# Patient Record
Sex: Male | Born: 1950
Health system: Southern US, Community
[De-identification: ages and names within clinical notes are randomized; demographics above are authoritative.]

## PROBLEM LIST (undated history)

## (undated) DIAGNOSIS — I639 Cerebral infarction, unspecified: Secondary | ICD-10-CM

## (undated) DIAGNOSIS — IMO0001 Reserved for inherently not codable concepts without codable children: Secondary | ICD-10-CM

## (undated) HISTORY — DX: Cerebral infarction, unspecified: I63.9

---

## 2002-08-31 ENCOUNTER — Emergency Department (HOSPITAL_COMMUNITY): Admission: EM | Admit: 2002-08-31 | Discharge: 2002-08-31 | Payer: Self-pay | Admitting: Emergency Medicine

## 2010-11-10 ENCOUNTER — Emergency Department (HOSPITAL_COMMUNITY): Admission: EM | Admit: 2010-11-10 | Discharge: 2010-11-10 | Payer: Self-pay | Admitting: Emergency Medicine

## 2016-05-08 ENCOUNTER — Emergency Department (HOSPITAL_BASED_OUTPATIENT_CLINIC_OR_DEPARTMENT_OTHER): Payer: BC Managed Care – PPO

## 2016-05-08 ENCOUNTER — Emergency Department (HOSPITAL_COMMUNITY): Payer: BC Managed Care – PPO

## 2016-05-08 ENCOUNTER — Inpatient Hospital Stay (HOSPITAL_BASED_OUTPATIENT_CLINIC_OR_DEPARTMENT_OTHER)
Admission: EM | Admit: 2016-05-08 | Discharge: 2016-05-15 | DRG: 065 | Disposition: A | Discharge status: Rehabilitation Facility | Payer: BC Managed Care – PPO | Attending: Internal Medicine | Admitting: Internal Medicine

## 2016-05-08 ENCOUNTER — Encounter (HOSPITAL_BASED_OUTPATIENT_CLINIC_OR_DEPARTMENT_OTHER): Payer: Self-pay | Admitting: *Deleted

## 2016-05-08 DIAGNOSIS — E785 Hyperlipidemia, unspecified: Secondary | ICD-10-CM | POA: Diagnosis present

## 2016-05-08 DIAGNOSIS — R1110 Vomiting, unspecified: Secondary | ICD-10-CM | POA: Diagnosis not present

## 2016-05-08 DIAGNOSIS — G459 Transient cerebral ischemic attack, unspecified: Secondary | ICD-10-CM | POA: Diagnosis not present

## 2016-05-08 DIAGNOSIS — I639 Cerebral infarction, unspecified: Secondary | ICD-10-CM | POA: Diagnosis not present

## 2016-05-08 DIAGNOSIS — H538 Other visual disturbances: Secondary | ICD-10-CM | POA: Diagnosis not present

## 2016-05-08 DIAGNOSIS — R29898 Other symptoms and signs involving the musculoskeletal system: Secondary | ICD-10-CM

## 2016-05-08 DIAGNOSIS — I10 Essential (primary) hypertension: Secondary | ICD-10-CM | POA: Diagnosis present

## 2016-05-08 DIAGNOSIS — R42 Dizziness and giddiness: Secondary | ICD-10-CM | POA: Diagnosis not present

## 2016-05-08 DIAGNOSIS — I472 Ventricular tachycardia: Secondary | ICD-10-CM | POA: Diagnosis not present

## 2016-05-08 DIAGNOSIS — G4700 Insomnia, unspecified: Secondary | ICD-10-CM | POA: Diagnosis not present

## 2016-05-08 DIAGNOSIS — R112 Nausea with vomiting, unspecified: Secondary | ICD-10-CM | POA: Diagnosis present

## 2016-05-08 DIAGNOSIS — I63531 Cerebral infarction due to unspecified occlusion or stenosis of right posterior cerebral artery: Secondary | ICD-10-CM | POA: Diagnosis not present

## 2016-05-08 DIAGNOSIS — M6281 Muscle weakness (generalized): Secondary | ICD-10-CM | POA: Diagnosis not present

## 2016-05-08 DIAGNOSIS — M79606 Pain in leg, unspecified: Secondary | ICD-10-CM

## 2016-05-08 HISTORY — DX: Reserved for inherently not codable concepts without codable children: IMO0001

## 2016-05-08 LAB — COMPREHENSIVE METABOLIC PANEL
ALT: 22 U/L (ref 17–63)
AST: 30 U/L (ref 15–41)
Albumin: 4.1 g/dL (ref 3.5–5.0)
Alkaline Phosphatase: 59 U/L (ref 38–126)
Anion gap: 7 (ref 5–15)
BUN: 17 mg/dL (ref 6–20)
CO2: 22 mmol/L (ref 22–32)
Calcium: 8.9 mg/dL (ref 8.9–10.3)
Chloride: 110 mmol/L (ref 101–111)
Creatinine, Ser: 1.01 mg/dL (ref 0.61–1.24)
GFR calc Af Amer: >60 mL/min (ref >60)
GFR calc non Af Amer: >60 mL/min (ref >60)
Glucose, Bld: 139 mg/dL — ABNORMAL HIGH (ref 65–99)
Potassium: 3.5 mmol/L (ref 3.5–5.1)
Sodium: 139 mmol/L (ref 135–145)
Total Bilirubin: 0.5 mg/dL (ref 0.3–1.2)
Total Protein: 7.2 g/dL (ref 6.5–8.1)

## 2016-05-08 LAB — RAPID URINE DRUG SCREEN, HOSP PERFORMED
Amphetamines: NOT DETECTED
Barbiturates: NOT DETECTED
Benzodiazepines: NOT DETECTED
Cocaine: NOT DETECTED
Opiates: NOT DETECTED
Tetrahydrocannabinol: NOT DETECTED

## 2016-05-08 LAB — CBC WITH DIFFERENTIAL/PLATELET
Basophils Absolute: 0 10*3/uL (ref 0.0–0.1)
Basophils Relative: 0 %
Eosinophils Absolute: 0.2 10*3/uL (ref 0.0–0.7)
Eosinophils Relative: 3 %
HCT: 44.8 % (ref 39.0–52.0)
Hemoglobin: 15.7 g/dL (ref 13.0–17.0)
Lymphocytes Relative: 32 %
Lymphs Abs: 2.2 10*3/uL (ref 0.7–4.0)
MCH: 33.3 pg (ref 26.0–34.0)
MCHC: 35 g/dL (ref 30.0–36.0)
MCV: 94.9 fL (ref 78.0–100.0)
Monocytes Absolute: 0.8 10*3/uL (ref 0.1–1.0)
Monocytes Relative: 11 %
Neutro Abs: 3.7 10*3/uL (ref 1.7–7.7)
Neutrophils Relative %: 54 %
Platelets: 212 10*3/uL (ref 150–400)
RBC: 4.72 MIL/uL (ref 4.22–5.81)
RDW: 12.9 % (ref 11.5–15.5)
WBC: 7 10*3/uL (ref 4.0–10.5)

## 2016-05-08 LAB — ETHANOL: Alcohol, Ethyl (B): <5 mg/dL (ref <5)

## 2016-05-08 LAB — URINALYSIS, ROUTINE W REFLEX MICROSCOPIC
Bilirubin Urine: NEGATIVE
Glucose, UA: NEGATIVE mg/dL
Hgb urine dipstick: NEGATIVE
Ketones, ur: NEGATIVE mg/dL
Leukocytes, UA: NEGATIVE
Nitrite: NEGATIVE
Protein, ur: NEGATIVE mg/dL
Specific Gravity, Urine: 1.024 (ref 1.005–1.030)
pH: 6.5 (ref 5.0–8.0)

## 2016-05-08 LAB — CBG MONITORING, ED
Glucose-Capillary: 143 mg/dL — ABNORMAL HIGH (ref 65–99)
Glucose-Capillary: 120 mg/dL — ABNORMAL HIGH (ref 65–99)

## 2016-05-08 LAB — PROTIME-INR
INR: 1.01 (ref 0.00–1.49)
Prothrombin Time: 13.5 seconds (ref 11.6–15.2)

## 2016-05-08 LAB — TROPONIN I: Troponin I: <0.03 ng/mL (ref <0.031)

## 2016-05-08 LAB — APTT: aPTT: 22 seconds — ABNORMAL LOW (ref 24–37)

## 2016-05-08 MED ORDER — DIAZEPAM 5 MG/ML IJ SOLN
1.0000 mg | Freq: Once | INTRAMUSCULAR | Status: AC
  Administered 2016-05-08: 1 mg via INTRAVENOUS
  Filled 2016-05-08: qty 2

## 2016-05-08 MED ORDER — LORAZEPAM 2 MG/ML IJ SOLN
0.5000 mg | Freq: Once | INTRAMUSCULAR | Status: AC
  Administered 2016-05-08: 0.5 mg via INTRAVENOUS
  Filled 2016-05-08: qty 1

## 2016-05-08 MED ORDER — METOCLOPRAMIDE HCL 5 MG/ML IJ SOLN
10.0000 mg | Freq: Once | INTRAMUSCULAR | Status: AC
  Administered 2016-05-08: 10 mg via INTRAVENOUS
  Filled 2016-05-08: qty 2

## 2016-05-08 MED ORDER — PROCHLORPERAZINE EDISYLATE 5 MG/ML IJ SOLN
10.0000 mg | Freq: Once | INTRAMUSCULAR | Status: AC
  Administered 2016-05-08: 10 mg via INTRAVENOUS
  Filled 2016-05-08: qty 2

## 2016-05-08 MED ORDER — DIAZEPAM 5 MG/ML IJ SOLN
5.0000 mg | Freq: Once | INTRAMUSCULAR | Status: AC
  Administered 2016-05-08: 5 mg via INTRAVENOUS
  Filled 2016-05-08: qty 2

## 2016-05-08 MED ORDER — ONDANSETRON HCL 4 MG/2ML IJ SOLN
4.0000 mg | Freq: Once | INTRAMUSCULAR | Status: AC
  Administered 2016-05-08: 4 mg via INTRAVENOUS
  Filled 2016-05-08: qty 2

## 2016-05-08 NOTE — ED Notes (Signed)
Delo MD at bedside. 

## 2016-05-08 NOTE — Consult Note (Signed)
Admission H&P    Chief Complaint: Acute onset of vertigo with nausea and weakness.  HPI: Marinus Eicher is an 65 y.o. male with no previously documented medical history abnormal medications who was brought to the emergency room and code stroke status following acute onset of vertigo and nausea as well as feeling of weakness involving upper extremities primarily. No facial droop was noted. There was also no slurred speech. He has no previous history of stroke nor TIA and has not been on antiplatelet therapy. CT scan of his head showed no acute intracranial abnormality. Clinical exam was unremarkable except for nystagmus on right lateral gaze. MRI of the brain was attempted but could not be obtained because the patient had much worse nausea and abdominal discomfort on lying flat.  LSN: 4:30 PM on 05/08/2016 tPA Given: No: No clear focal deficits mRankin:  History reviewed. No pertinent past medical history.  History reviewed. No pertinent past surgical history.  Family history reviewed. Family history negative for stroke.  Social History:  reports that he has never smoked. He has never used smokeless tobacco. He reports that he does not drink alcohol or use illicit drugs.  Allergies: No Known Allergies  Medications: Patient was on no medications prior to admission.  ROS: History obtained from the patient  General ROS: negative for - chills, fatigue, fever, night sweats, weight gain or weight loss Psychological ROS: negative for - behavioral disorder, hallucinations, memory difficulties, mood swings or suicidal ideation Ophthalmic ROS: negative for - blurry vision, double vision, eye pain or loss of vision ENT ROS: negative for - epistaxis, nasal discharge, oral lesions, sore throat, tinnitus or vertigo Allergy and Immunology ROS: negative for - hives or itchy/watery eyes Hematological and Lymphatic ROS: negative for - bleeding problems, bruising or swollen lymph nodes Endocrine ROS:  negative for - galactorrhea, hair pattern changes, polydipsia/polyuria or temperature intolerance Respiratory ROS: negative for - cough, hemoptysis, shortness of breath or wheezing Cardiovascular ROS: negative for - chest pain, dyspnea on exertion, edema or irregular heartbeat Gastrointestinal ROS: negative for - abdominal pain, diarrhea, hematemesis, nausea/vomiting or stool incontinence Genito-Urinary ROS: negative for - dysuria, hematuria, incontinence or urinary frequency/urgency Musculoskeletal ROS: negative for - joint swelling or muscular weakness Neurological ROS: as noted in HPI Dermatological ROS: negative for rash and skin lesion changes  Physical Examination: Blood pressure 128/88, pulse 75, temperature 97.5 F (36.4 C), temperature source Oral, resp. rate 27, height 6' (1.829 m), weight 90.719 kg (200 lb), SpO2 99 %.  HEENT-  Normocephalic, no lesions, without obvious abnormality.  Normal external eye and conjunctiva.  Normal TM's bilaterally.  Normal auditory canals and external ears. Normal external nose, mucus membranes and septum.  Normal pharynx. Neck supple with no masses, nodes, nodules or enlargement. Cardiovascular - regular rate and rhythm, S1, S2 normal, no murmur, click, rub or gallop Lungs - chest clear, no wheezing, rales, normal symmetric air entry Abdomen - soft, non-tender; bowel sounds normal; no masses,  no organomegaly Extremities - no joint deformities, effusion, or inflammation and no edema  Neurologic Examination: Mental Status: Alert, oriented, complaining of vertigo and nausea.  Speech fluent without evidence of aphasia. Able to follow commands without difficulty. Cranial Nerves: II-Visual fields were normal. III/IV/VI-Pupils were equal and reacted. Extraocular movements were full and conjugate. Mild horizontal nystagmus was noted on right lateral gaze.   V/VII-no facial numbness and no facial weakness. VIII-normal. X-normal speech. XI: trapezius  strength/neck flexion strength normal bilaterally XII-midline tongue extension with normal strength. Motor: 5/5  bilaterally with normal tone and bulk Sensory: Normal throughout. Deep Tendon Reflexes: 1+ and symmetrical. Plantars: Mute bilaterally Cerebellar: Normal finger-to-nose testing. Carotid auscultation: Normal  Results for orders placed or performed during the hospital encounter of 05/08/16 (from the past 48 hour(s))  CBG monitoring, ED     Status: Abnormal   Collection Time: 05/08/16  5:49 PM  Result Value Ref Range   Glucose-Capillary 143 (H) 65 - 99 mg/dL  Ethanol     Status: None   Collection Time: 05/08/16  5:50 PM  Result Value Ref Range   Alcohol, Ethyl (B) <5 <5 mg/dL    Comment:        LOWEST DETECTABLE LIMIT FOR SERUM ALCOHOL IS 5 mg/dL FOR MEDICAL PURPOSES ONLY   Protime-INR     Status: None   Collection Time: 05/08/16  5:50 PM  Result Value Ref Range   Prothrombin Time 13.5 11.6 - 15.2 seconds   INR 1.01 0.00 - 1.49  APTT     Status: Abnormal   Collection Time: 05/08/16  5:50 PM  Result Value Ref Range   aPTT 22 (L) 24 - 37 seconds  Comprehensive metabolic panel     Status: Abnormal   Collection Time: 05/08/16  5:50 PM  Result Value Ref Range   Sodium 139 135 - 145 mmol/L   Potassium 3.5 3.5 - 5.1 mmol/L   Chloride 110 101 - 111 mmol/L   CO2 22 22 - 32 mmol/L   Glucose, Bld 139 (H) 65 - 99 mg/dL   BUN 17 6 - 20 mg/dL   Creatinine, Ser 1.01 0.61 - 1.24 mg/dL   Calcium 8.9 8.9 - 10.3 mg/dL   Total Protein 7.2 6.5 - 8.1 g/dL   Albumin 4.1 3.5 - 5.0 g/dL   AST 30 15 - 41 U/L   ALT 22 17 - 63 U/L   Alkaline Phosphatase 59 38 - 126 U/L   Total Bilirubin 0.5 0.3 - 1.2 mg/dL   GFR calc non Af Amer >60 >60 mL/min   GFR calc Af Amer >60 >60 mL/min    Comment: (NOTE) The eGFR has been calculated using the CKD EPI equation. This calculation has not been validated in all clinical situations. eGFR's persistently <60 mL/min signify possible Chronic  Kidney Disease.    Anion gap 7 5 - 15  CBC with Differential/Platelet     Status: None   Collection Time: 05/08/16  5:50 PM  Result Value Ref Range   WBC 7.0 4.0 - 10.5 K/uL   RBC 4.72 4.22 - 5.81 MIL/uL   Hemoglobin 15.7 13.0 - 17.0 g/dL   HCT 44.8 39.0 - 52.0 %   MCV 94.9 78.0 - 100.0 fL   MCH 33.3 26.0 - 34.0 pg   MCHC 35.0 30.0 - 36.0 g/dL   RDW 12.9 11.5 - 15.5 %   Platelets 212 150 - 400 K/uL   Neutrophils Relative % 54 %   Neutro Abs 3.7 1.7 - 7.7 K/uL   Lymphocytes Relative 32 %   Lymphs Abs 2.2 0.7 - 4.0 K/uL   Monocytes Relative 11 %   Monocytes Absolute 0.8 0.1 - 1.0 K/uL   Eosinophils Relative 3 %   Eosinophils Absolute 0.2 0.0 - 0.7 K/uL   Basophils Relative 0 %   Basophils Absolute 0.0 0.0 - 0.1 K/uL  Troponin I     Status: None   Collection Time: 05/08/16  5:50 PM  Result Value Ref Range   Troponin I <0.03 <0.031 ng/mL  Comment:        NO INDICATION OF MYOCARDIAL INJURY.    Ct Head Wo Contrast  05/08/2016  CLINICAL DATA:  Sudden onset dizziness, vomiting and bilateral upper extremity weakness. EXAM: CT HEAD WITHOUT CONTRAST TECHNIQUE: Contiguous axial images were obtained from the base of the skull through the vertex without intravenous contrast. COMPARISON:  None. FINDINGS: There is some limitation due to patient motion and streak artifacts, despite repeat imaging. Diffusely enlarged ventricles and subarachnoid spaces. No intracranial hemorrhage, mass lesion or CT evidence of acute infarction. Unremarkable bones and included paranasal sinuses. IMPRESSION: Mildly limited examination demonstrating mild atrophy with no acute abnormality. These results were called by telephone at the time of interpretation on 05/08/2016 at 6:09 pm to Alan Mulder, the nurse working with Ian Bushman, PA-C , who verbally acknowledged these results. Electronically Signed   By: Claudie Revering M.D.   On: 05/08/2016 18:11    Assessment: 65 y.o. male with intractable vertigo and nausea of  unclear etiology, but likely benign positional vertigo. However, an acute cerebellar or brainstem stroke cannot be ruled out at this point. Patient has no focal deficits at this point.  Stroke Risk Factors - none  Plan: 1. Meclizine 25 mg every 6 hours 2. MRI of the brain without contrast when patient can tolerate lying flat 3. Stroke workup including risk assessment if MRI shows acute infarction 4. PT consult for vestibular therapy  We will continue to follow this patient with you.  C.R. Nicole Kindred, MD [facility] [phone]  05/08/2016, 7:33 PM

## 2016-05-08 NOTE — ED Notes (Signed)
Family at bedside. 

## 2016-05-08 NOTE — ED Notes (Signed)
Pt report sudden onset dizziness, "like i'm going to pass out"- brought in by EMS. Eval at bedside by EDPA Harris upon arrival to room 6. piv 18 left ac by EMS. 4mg  zofran given pta

## 2016-05-08 NOTE — ED Notes (Signed)
Code stroke called by Arthor CaptainAbigail Harris, EDPA

## 2016-05-08 NOTE — ED Notes (Signed)
Patient transported to CT and returned 

## 2016-05-08 NOTE — H&P (Signed)
History and Physical  Patient Name: [name]     JXB:[id number]    DOB: September 26, 1951    DOA: 05/08/2016 Referring provider: Jacalyn Lefevre, MD PCP: Tony Hillier, MD  Outpatient specialists:  None      Patient coming from: Work via EMS  Chief Complaint: Vertigo  HPI: [name] is a 65 y.o. 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 He stopped the bus, got out and threw up, and then someone called 9-1-1.    In the ED, he was afebrile, hemodynamically stable, and saturating well on room air.  He complained of global weakness, but had no slurred speech, no focal weakness, no facial asymmetry.  He was transferred to [facility] as a CODE STROKE, CT head was normal, and MR brain was attempted but aborted because the patient was repeatedly vomiting.  Neurology were consulted and evaluated the patient, noting lateralizing nystagmus and no other focal deficits.  He then was given several doses of diazepam and lorazepam, and TRH were asked to admit for observation and MRI in the morning.  He denies headache or neck pain.  He has no history of vertigo.      Review of Systems:  All other systems negative except as just noted or noted in the history of present illness.    History reviewed. No pertinent past medical history.  History reviewed. No pertinent past surgical history.  Social History: Patient lives with his wife.  He drives a school bus.  He does not smoke or use alcohol.  He is from [place].  He is ambulatory without assistance and indepdendent with all ADLs and IADLs.    No Known Allergies  Family history: Parents were healthy.  Possibly mother had stroke, wife is not sure, patient unable to confirm.  Prior to Admission medications   None       Physical Exam: BP  132/88 mmHg  Pulse 79  Temp(Src) 97.5 F (36.4 C) (Oral)  Resp 17  Ht 6' (1.829 m)  Wt 90.719 kg (200 lb)  BMI 27.12 kg/m2  SpO2 96% General appearance: Well-developed, adult male, eyes closed, appears uncomfortable, moaning, oriented, but uncomfortable from nausea, just repeats "my head and face are spinning".  Eyes: Anicteric, conjunctiva pink, lids and lashes normal.     ENT: No nasal deformity, discharge, or epistaxis.  OP moist without lesions.   Skin: Warm and dry.  No jaundice.  No suspicious rashes or lesions. Cardiac: RRR, nl S1-S2, no murmurs appreciated.  Capillary refill is brisk.  No LE edema.  Radial and DP pulses 2+ and symmetric. Respiratory: Normal respiratory rate and rhythm.  CTAB without rales or wheezes. Abdomen: Abdomen soft without rigidity.  No TTP. No ascites, distension.   MSK: No deformities or effusions. Neuro: Does not cooperate with exam given nausea and sedation with diazepam/lorazepam, no nystagmus elicited on my exam.  Oriented but very uncomfortable.  No facial asymmetry.  Speech is fluent.  Moves all extremities equally and with normal coordination, but globally 4/5 strength, very weak. Psych: Affect: in discomfort.  No evidence of aural or visual hallucinations or delusions.       Labs on Admission:  I have personally reviewed following labs and imaging studies: CBC:  Recent Labs Lab 05/08/16 1750  WBC 7.0  NEUTROABS  3.7  HGB 15.7  HCT 44.8  MCV 94.9  PLT 212   Basic Metabolic Panel:  Recent Labs Lab 05/08/16 1750  NA 139  K 3.5  CL 110  CO2 22  GLUCOSE 139*  BUN 17  CREATININE 1.01  CALCIUM 8.9   GFR: Estimated Creatinine Clearance: 80 mL/min (by C-G formula based on Cr of 1.01). Liver Function Tests:  Recent Labs Lab 05/08/16 1750  AST 30  ALT 22  ALKPHOS 59  BILITOT 0.5  PROT 7.2  ALBUMIN 4.1   No results for input(s): LIPASE, AMYLASE in the last 168 hours. No results for input(s): AMMONIA in the last 168  hours. Coagulation Profile:  Recent Labs Lab 05/08/16 1750  INR 1.01   Cardiac Enzymes:  Recent Labs Lab 05/08/16 1750  TROPONINI <0.03   BNP (last 3 results) No results for input(s): PROBNP in the last 8760 hours. HbA1C: No results for input(s): HGBA1C in the last 72 hours. CBG:  Recent Labs Lab 05/08/16 1749  GLUCAP 143*   Lipid Profile: No results for input(s): CHOL, HDL, LDLCALC, TRIG, CHOLHDL, LDLDIRECT in the last 72 hours. Thyroid Function Tests: No results for input(s): TSH, T4TOTAL, FREET4, T3FREE, THYROIDAB in the last 72 hours. Anemia Panel: No results for input(s): VITAMINB12, FOLATE, FERRITIN, TIBC, IRON, RETICCTPCT in the last 72 hours. Urine analysis:    Component Value Date/Time   COLORURINE YELLOW 05/08/2016 2040   APPEARANCEUR CLEAR 05/08/2016 2040   LABSPEC 1.024 05/08/2016 2040   PHURINE 6.5 05/08/2016 2040   GLUCOSEU NEGATIVE 05/08/2016 2040   HGBUR NEGATIVE 05/08/2016 2040   BILIRUBINUR NEGATIVE 05/08/2016 2040   KETONESUR NEGATIVE 05/08/2016 2040   PROTEINUR NEGATIVE 05/08/2016 2040   NITRITE NEGATIVE 05/08/2016 2040   LEUKOCYTESUR NEGATIVE 05/08/2016 2040   Sepsis Labs: @LABRCNTIP (procalcitonin:4,lacticidven:4) )No results found for this or any previous visit (from the past 240 hour(s)).       Radiological Exams on Admission: Personally reviewed: Ct Head Wo Contrast  05/08/2016  CLINICAL DATA:  Sudden onset dizziness, vomiting and bilateral upper extremity weakness. EXAM: CT HEAD WITHOUT CONTRAST TECHNIQUE: Contiguous axial images were obtained from the base of the skull through the vertex without intravenous contrast. COMPARISON:  None. FINDINGS: There is some limitation due to patient motion and streak artifacts, despite repeat imaging. Diffusely enlarged ventricles and subarachnoid spaces. No intracranial hemorrhage, mass lesion or CT evidence of acute infarction. Unremarkable bones and included paranasal sinuses. IMPRESSION: Mildly  limited examination demonstrating mild atrophy with no acute abnormality. These results were called by telephone at the time of interpretation on 05/08/2016 at 6:09 pm to Tony PersiaKelly Berg, the nurse working with Tony BouchardAbigal Harris, PA-C , who verbally acknowledged these results. Electronically Signed   By: Beckie SaltsSteven  Reid M.D.   On: 05/08/2016 18:11    EKG: Independently reviewed. Rate 79, QTc 467, wandering basleine, but no clear ST depressions or TW changes.    Assessment/Plan 1. Vertigo with intractable vomiting:  Suspect peripheral vertigo.  Cannot rule out posterior circ stroke at this time.   -MR brain per Neurology for r/o stroke -If stroke present, echocardiogram and CTA will be ordered. -Otherwise, will focus on symptomatic treatment of what is likely BPPV -meclizine 25 mg TID PRN -Lorazepam PRN -Ondansetron PRN -PT for vestibular rehab      DVT prophylaxis: Lovenox  Code Status: FULL  Family Communication: Wife at bedside  Disposition Plan: Anticipate observation overnight and MRI brain tomorrow.  If normal, home with symptomatic care for BPPV, otherwise, stroke work  up per protocol Consults called: Neuro Medical decision making: Patient seen at 10:55 PM on 05/08/2016.  The patient was discussed with Dr. Particia Nearing. I recommend admission to telemetry, observation status.  Clinical condition: stable.      Alberteen Sam [facility] Pager [phone]

## 2016-05-08 NOTE — ED Notes (Signed)
Report attempted, CN to assign pt and RN to call back.

## 2016-05-08 NOTE — ED Notes (Signed)
[facility]

## 2016-05-08 NOTE — ED Notes (Signed)
Neuro at bedside.

## 2016-05-08 NOTE — ED Notes (Signed)
Unable to complete MRI of head due to pt unable to stay flat on the MRI machine, pt became very nauseous and starting vomiting, nausea medication given x 2 with no relief. MD to be notified.

## 2016-05-08 NOTE — ED Provider Notes (Signed)
CSN: [id number]     Arrival date & time 05/08/16  1740 History   First MD Initiated Contact with Patient 05/08/16 1758     Chief Complaint  Patient presents with  . Dizziness     (Consider location/radiation/quality/duration/timing/severity/associated sxs/prior Treatment) HPI Comments: Patient is a 65 year old male with no significant past medical history and who takes no medications. 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 He also developed weakness in both upper arms and blurry vision. He was given Zofran in route for his nausea, but remains very dizzy.  Patient is a 65 y.o. male presenting with dizziness. The history is provided by the patient and the EMS personnel.  Dizziness Quality:  Head spinning and imbalance Severity:  Severe Onset quality:  Sudden Duration:  1 hour Timing:  Constant Progression:  Unchanged Chronicity:  New Relieved by:  Nothing Worsened by:  Movement and sitting upright Ineffective treatments:  None tried Associated symptoms: nausea, vision changes, vomiting and weakness   Associated symptoms: no headaches and no tinnitus     History reviewed. No pertinent past medical history. History reviewed. No pertinent past surgical history. No family history on file. Social History  Substance Use Topics  . Smoking status: Never Smoker   . Smokeless tobacco: Never Used  . Alcohol Use: No    Review of Systems  HENT: Negative for tinnitus.   Gastrointestinal: Positive for nausea and vomiting.  Neurological: Positive for dizziness and weakness. Negative for headaches.  All other systems reviewed and are negative.     Allergies  Review of patient's allergies indicates no known allergies.  Home Medications   Prior to Admission medications   Not on File   BP 149/84 mmHg  Pulse 72  Temp(Src) 97.5 F (36.4 C) (Oral)  Resp 21  Ht 6' (1.829 m)  Wt 200 lb (90.719 kg)  BMI  27.12 kg/m2  SpO2 100% Physical Exam  Constitutional: He is oriented to person, place, and time. He appears well-developed and well-nourished.  Patient is a 65 year old male who appears uncomfortable.  HENT:  Head: Normocephalic and atraumatic.  Mouth/Throat: Oropharynx is clear and moist.  Eyes: Pupils are equal, round, and reactive to light.  He has a hard time tracking objects with extraocular muscles.  Neck: Normal range of motion. Neck supple.  Cardiovascular: Normal rate and regular rhythm.  Exam reveals no friction rub.   No murmur heard. Pulmonary/Chest: Effort normal and breath sounds normal. No respiratory distress. He has no wheezes. He has no rales.  Abdominal: Soft. Bowel sounds are normal. He exhibits no distension. There is no tenderness.  Musculoskeletal: Normal range of motion. He exhibits no edema.  Neurological: He is alert and oriented to person, place, and time. No cranial nerve deficit. He exhibits normal muscle tone. Coordination normal.  Skin: Skin is warm and dry. He is not diaphoretic.  Nursing note and vitals reviewed.   ED Course  Procedures (including critical care time) Labs Review Labs Reviewed  CBG MONITORING, ED - Abnormal; Notable for the following:    Glucose-Capillary 143 (*)    All other components within normal limits  ETHANOL  PROTIME-INR  APTT  DIFFERENTIAL  COMPREHENSIVE METABOLIC PANEL  URINE RAPID DRUG SCREEN, HOSP PERFORMED  URINALYSIS, ROUTINE W REFLEX MICROSCOPIC (NOT AT [facility])  CBC WITH DIFFERENTIAL/PLATELET  I-STAT CHEM 8, ED  I-STAT TROPOININ, ED    Imaging Review No results found. I have personally reviewed  and evaluated these images and lab results as part of my medical decision-making.   EKG Interpretation   Date/Time:  Tuesday May 08 2016 17:42:48 EDT Ventricular Rate:  79 PR Interval:  193 QRS Duration: 107 QT Interval:  407 QTC Calculation: 467 R Axis:   85 Text Interpretation:  Sinus rhythm Consider right  ventricular hypertrophy  Confirmed by Oluwaseun Bruyere  MD, Winferd Wease ([id number]) on 05/08/2016 10:06:03 PM      MDM   Final diagnoses:  Vertigo  Upper extremity weakness  Nausea and vomiting, vomiting of unspecified type    Patient arrives here acutely vertiginous and weakness in the arms on exam. His symptoms are possibly related to peripheral vertigo, however they are also possibly related to an acute posterior circulation stroke. For this reason, a code stroke was called and Dr. Amada JupiterKirkpatrick was consult. We came to the conclusion of patient's best served being worked up [facility] where intervention could potentially be performed should the patient actually be experiencing an acute stroke. The CT head was negative here. [facility].    Geoffery Lyonsouglas Mazell Aylesworth, MD 05/08/16 2209

## 2016-05-08 NOTE — ED Notes (Signed)
Transported to [facility] by [facility] at this time

## 2016-05-08 NOTE — ED Provider Notes (Signed)
CSN: [id number]     Arrival date & time 05/08/16  1740 History   First MD Initiated Contact with Patient 05/08/16 1758     Chief Complaint  Patient presents with  . Dizziness     (Consider location/radiation/quality/duration/timing/severity/associated sxs/prior Treatment) HPI  History reviewed. No pertinent past medical history. History reviewed. No pertinent past surgical history. No family history on file. Social History  Substance Use Topics  . Smoking status: Never Smoker   . Smokeless tobacco: Never Used  . Alcohol Use: No    Review of Systems    Allergies  Review of patient's allergies indicates no known allergies.  Home Medications   Prior to Admission medications   Not on File   BP 128/84 mmHg  Pulse 77  Temp(Src) 97.5 F (36.4 C) (Oral)  Resp 19  Ht 6' (1.829 m)  Wt 200 lb (90.719 kg)  BMI 27.12 kg/m2  SpO2 99% Physical Exam  ED Course  Procedures (including critical care time) Labs Review Labs Reviewed  APTT - Abnormal; Notable for the following:    aPTT 22 (*)    All other components within normal limits  COMPREHENSIVE METABOLIC PANEL - Abnormal; Notable for the following:    Glucose, Bld 139 (*)    All other components within normal limits  CBG MONITORING, ED - Abnormal; Notable for the following:    Glucose-Capillary 143 (*)    All other components within normal limits  ETHANOL  PROTIME-INR  URINE RAPID DRUG SCREEN, HOSP PERFORMED  URINALYSIS, ROUTINE W REFLEX MICROSCOPIC (NOT [facility])  CBC WITH DIFFERENTIAL/PLATELET  TROPONIN I  DIFFERENTIAL    Imaging Review Ct Head Wo Contrast  05/08/2016  CLINICAL DATA:  Sudden onset dizziness, vomiting and bilateral upper extremity weakness. EXAM: CT HEAD WITHOUT CONTRAST TECHNIQUE: Contiguous axial images were obtained from the base of the skull through the vertex without intravenous contrast. COMPARISON:  None. FINDINGS: There is some limitation due to patient motion and streak artifacts, despite  repeat imaging. Diffusely enlarged ventricles and subarachnoid spaces. No intracranial hemorrhage, mass lesion or CT evidence of acute infarction. Unremarkable bones and included paranasal sinuses. IMPRESSION: Mildly limited examination demonstrating mild atrophy with no acute abnormality. These results were called by telephone at the time of interpretation on 05/08/2016 at 6:09 pm to Jose PersiaKelly Conner, the nurse working with Rutha BouchardAbigal Harris, PA-C , who verbally acknowledged these results. Electronically Signed   By: Beckie SaltsSteven  Reid M.D.   On: 05/08/2016 18:11   I have personally reviewed and evaluated these images and lab results as part of my medical decision-making.   EKG Interpretation None      MDM  [facility]  DR. Amada JupiterKIRKPATRICK (NEURO) IN THE ROOM WITH PT AS WELL.  PT DOES NOT HAVE ANY FOCAL DEFICITS, BUT IS VERY DIZZY AND HAS N/V.  PT SENT TO MRI, BUT HE WAS UNABLE TO LAY FLAT FOR THE EXAM.  PT ADDITIONALLY SEEN BY DR. Roseanne RenoSTEWART (NEURO) WHO THINKS PT PROBABLY HAS VERTIGO, BUT A CVA IS NOT RULED OUT DUE TO INABILITY TO DO THE MRI.  HE FEELS PT SHOULD BE OBSERVED OVERNIGHT ADMITTED BY THE HOSPITALIST.  PT D/W DR. Maryfrances BunnellANFORD (HOSPITALIST) WHO WILL ADMIT PT. Final diagnoses:  Vertigo  Upper extremity weakness  Nausea and vomiting, vomiting of unspecified type  Stroke [facility])        Jacalyn LefevreJulie Hudsen Fei, MD 05/08/16 2227

## 2016-05-08 NOTE — ED Notes (Signed)
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  CT -, bloodwork completed. [facility].  BP-128/82 P-75 NSR, O2-97% 2L, denies pain.   Pt transferred for MRI.

## 2016-05-09 ENCOUNTER — Observation Stay (HOSPITAL_COMMUNITY): Payer: BC Managed Care – PPO

## 2016-05-09 DIAGNOSIS — R1110 Vomiting, unspecified: Secondary | ICD-10-CM | POA: Diagnosis present

## 2016-05-09 DIAGNOSIS — G4700 Insomnia, unspecified: Secondary | ICD-10-CM | POA: Diagnosis not present

## 2016-05-09 DIAGNOSIS — E785 Hyperlipidemia, unspecified: Secondary | ICD-10-CM | POA: Diagnosis not present

## 2016-05-09 DIAGNOSIS — I63531 Cerebral infarction due to unspecified occlusion or stenosis of right posterior cerebral artery: Secondary | ICD-10-CM | POA: Diagnosis not present

## 2016-05-09 DIAGNOSIS — I639 Cerebral infarction, unspecified: Secondary | ICD-10-CM | POA: Diagnosis not present

## 2016-05-09 LAB — CBC
HCT: 40.5 % (ref 39.0–52.0)
Hemoglobin: 13.8 g/dL (ref 13.0–17.0)
MCH: 32.5 pg (ref 26.0–34.0)
MCHC: 34.1 g/dL (ref 30.0–36.0)
MCV: 95.3 fL (ref 78.0–100.0)
Platelets: 211 10*3/uL (ref 150–400)
RBC: 4.25 MIL/uL (ref 4.22–5.81)
RDW: 13.2 % (ref 11.5–15.5)
WBC: 9.8 10*3/uL (ref 4.0–10.5)

## 2016-05-09 LAB — LIPID PANEL
Cholesterol: 165 mg/dL (ref 0–200)
HDL: 41 mg/dL (ref >40)
LDL Cholesterol: 117 mg/dL — ABNORMAL HIGH (ref 0–99)
Total CHOL/HDL Ratio: 4 RATIO
Triglycerides: 34 mg/dL (ref <150)
VLDL: 7 mg/dL (ref 0–40)

## 2016-05-09 LAB — CREATININE, SERUM
Creatinine, Ser: 0.97 mg/dL (ref 0.61–1.24)
GFR calc Af Amer: >60 mL/min (ref >60)
GFR calc non Af Amer: >60 mL/min (ref >60)

## 2016-05-09 MED ORDER — SENNOSIDES-DOCUSATE SODIUM 8.6-50 MG PO TABS: 1.0000 | ORAL_TABLET | Freq: Every evening | ORAL | Status: DC | PRN

## 2016-05-09 MED ORDER — SODIUM CHLORIDE 0.9 % IV SOLN
INTRAVENOUS | Status: DC
  Administered 2016-05-09: via INTRAVENOUS

## 2016-05-09 MED ORDER — DIPHENHYDRAMINE HCL 50 MG/ML IJ SOLN: 12.5000 mg | Freq: Four times a day (QID) | INTRAMUSCULAR | Status: DC | PRN

## 2016-05-09 MED ORDER — PROCHLORPERAZINE EDISYLATE 5 MG/ML IJ SOLN: 10.0000 mg | Freq: Four times a day (QID) | INTRAMUSCULAR | Status: DC | PRN

## 2016-05-09 MED ORDER — ACETAMINOPHEN 650 MG RE SUPP: 650.0000 mg | RECTAL | Status: DC | PRN

## 2016-05-09 MED ORDER — STROKE: EARLY STAGES OF RECOVERY BOOK
Freq: Once | Status: AC
  Administered 2016-05-09
  Filled 2016-05-09: qty 1

## 2016-05-09 MED ORDER — ENOXAPARIN SODIUM 40 MG/0.4ML ~~LOC~~ SOLN
40.0000 mg | SUBCUTANEOUS | Status: DC
  Administered 2016-05-10 – 2016-05-15 (×6): 40 mg via SUBCUTANEOUS
  Filled 2016-05-09 (×7): qty 0.4

## 2016-05-09 MED ORDER — ONDANSETRON HCL 4 MG/2ML IJ SOLN
4.0000 mg | Freq: Three times a day (TID) | INTRAMUSCULAR | Status: DC | PRN
  Administered 2016-05-09: 4 mg via INTRAVENOUS
  Filled 2016-05-09 (×2): qty 2

## 2016-05-09 MED ORDER — LORAZEPAM 2 MG/ML IJ SOLN
0.5000 mg | Freq: Four times a day (QID) | INTRAMUSCULAR | Status: DC | PRN
  Administered 2016-05-09 – 2016-05-10 (×2): 0.5 mg via INTRAVENOUS
  Filled 2016-05-09 (×2): qty 1

## 2016-05-09 MED ORDER — MECLIZINE HCL 12.5 MG PO TABS: 25.0000 mg | ORAL_TABLET | Freq: Three times a day (TID) | ORAL | Status: DC | PRN

## 2016-05-09 MED ORDER — MECLIZINE HCL 12.5 MG PO TABS
25.0000 mg | ORAL_TABLET | Freq: Three times a day (TID) | ORAL | Status: DC
  Administered 2016-05-09 – 2016-05-11 (×9): 25 mg via ORAL
  Filled 2016-05-09 (×9): qty 2

## 2016-05-09 MED ORDER — ACETAMINOPHEN 325 MG PO TABS
650.0000 mg | ORAL_TABLET | ORAL | Status: DC | PRN
  Administered 2016-05-11 – 2016-05-13 (×2): 650 mg via ORAL
  Filled 2016-05-09 (×2): qty 2

## 2016-05-09 MED ORDER — ONDANSETRON HCL 4 MG PO TABS
4.0000 mg | ORAL_TABLET | Freq: Three times a day (TID) | ORAL | Status: DC | PRN
  Administered 2016-05-09: 4 mg via ORAL
  Filled 2016-05-09: qty 1

## 2016-05-09 NOTE — Evaluation (Signed)
Clinical/Bedside Swallow Evaluation Patient Details  Name: [name] MRN: [id number] Date of Birth: 09/27/1951  Today's Date: 05/09/2016 Time: SLP Start Time (ACUTE ONLY): 1410 SLP Stop Time (ACUTE ONLY): 1422 SLP Time Calculation (min) (ACUTE ONLY): 12 min  Past Medical History: History reviewed. No pertinent past medical history. Past Surgical History: History reviewed. No pertinent past surgical history. HPI:  65 y.o. male with no significant past medical history who presents with acute onset vertigo. MRI pending.   Assessment / Plan / Recommendation Clinical Impression  Pt was seen slightly reclined in bed due to increased dizziness as HOB was raised. He consumed self-fed trials of thin liquids and very small amounts of puree without overt signs of dysphagia or airway compromise. No focal CN impairment noted. Recommend to start a clear liquid diet at this time due to nausea, but anticipate that he would be safe to advance to regular textures and thin liquids as his nausea subsides. Will defer diet advancement up to regular textures to MD, but will f/u briefly as he is able to advance.    Aspiration Risk  Mild aspiration risk    Diet Recommendation Regular;Thin liquid;Other (Comment) (will start clear liquid diet due to nausea)   Liquid Administration via: Straw;Cup Medication Administration: Whole meds with liquid Supervision: Patient able to self feed;Intermittent supervision to cue for compensatory strategies Compensations: Slow rate;Small sips/bites Postural Changes: Seated upright at 90 degrees    Other  Recommendations Oral Care Recommendations: Oral care BID   Follow up Recommendations   (tba)    Frequency and Duration min 1 x/week  1 week       Prognosis Prognosis for Safe Diet Advancement: Good      Swallow Study   General HPI: 65 y.o. male with no significant past medical history who presents with acute onset vertigo. MRI pending. Type of Study: Bedside  Swallow Evaluation Previous Swallow Assessment: none in chart Diet Prior to this Study: NPO Temperature Spikes Noted: No Respiratory Status: Room air History of Recent Intubation: No Behavior/Cognition: Alert;Cooperative Oral Cavity Assessment: Within Functional Limits Oral Care Completed by SLP: No Oral Cavity - Dentition: Adequate natural dentition Vision: Functional for self-feeding Self-Feeding Abilities: Able to feed self Patient Positioning: Partially reclined (slightly reclined due to dizziness) Baseline Vocal Quality: Normal    Oral/Motor/Sensory Function Overall Oral Motor/Sensory Function: Within functional limits   Ice Chips Ice chips: Within functional limits Presentation: Spoon   Thin Liquid Thin Liquid: Within functional limits Presentation: Cup;Self Fed;Straw    Nectar Thick Nectar Thick Liquid: Not tested   Honey Thick Honey Thick Liquid: Not tested   Puree Puree: Within functional limits Presentation: Self Fed;Spoon   Solid   GO   Solid: Not tested    Functional Assessment Tool Used: skilled clinical judgment Functional Limitations: Swallowing Swallow Current Status (I6962(G8996): At least 1 percent but less than 20 percent impaired, limited or restricted Swallow Goal Status (X5284(G8997): 0 percent impaired, limited or restricted   Maxcine HamLaura Paiewonsky, M.A. CCC-SLP [phone]  Maxcine Hamaiewonsky, [name] 05/09/2016,2:42 PM

## 2016-05-09 NOTE — Progress Notes (Signed)
Subjective: Still very vertiginous. Laying on his right side --if lays on his back has vertigo. Not had MRI at this point. Has not had meclizine yet. Wife noted he had a feeling of fullness in his left ear over past week.   Exam: Filed Vitals:   05/09/16 0615 05/09/16 1033  BP: 117/71 106/57  Pulse: 81 77  Temp: 98.6 F (37 C) 98.4 F (36.9 C)  Resp: 18 17        Gen: In bed, NAD MS: alert and oriented. Follows commands.  CN: 2-12 intact with no nystagmus noted.  Motor: MAEW Sensory:Intact   Pertinent Labs/Diagnostics: none  Felicie MornDavid Smith PA-C [facility] [phone]  Impression: 65 y.o. male with intractable vertigo and nausea of unclear etiology, but likely benign positional vertigo. However, an acute cerebellar or brainstem stroke cannot be ruled out at this point. Patient has no focal deficits at this point.   Recommendations: 1. Meclizine 25 mg every 6 hours 2. MRI of the brain without contrast when patient can tolerate lying flat--Attempted two time and unable to remain still.  3. Stroke workup including risk assessment if MRI shows acute infarction 4. PT consult for vestibular therapy   Ritta SlotMcNeill Alawna Graybeal, MD [facility] [phone]  If 7pm- 7am, please page neurology on call as listed in AMION.  05/09/2016, 10:49 AM

## 2016-05-09 NOTE — Progress Notes (Signed)
Patient and spouse made aware that the room was a camera room. Educated the patient and his spouse on the use of the footage.

## 2016-05-09 NOTE — Progress Notes (Signed)
[facility] Progress Note  Patient: [name] WUJ:[id number]   PCP: Mickie Hillier, MD DOB: 1951-09-02   DOA: 05/08/2016   DOS: 05/09/2016   Date of Service: the patient was seen and examined on 05/09/2016  Subjective: The patient continues to have severe nausea without any vomiting. Also complains of dizziness which seems like vertigo. No headache no focal deficit no chest pain and abdominal pain no diarrhea no constipation. Patient complains of some fullness of the ear. Nutrition: Minimal oral intake due to persistent nausea  Brief hospital course: Patient was admitted on 05/08/2016, with complaint of nausea and vertigo, was found to have likely peripheral vertigo versus posterior circulation stroke. Currently further plan is to further stroke workup.  Assessment and Plan: 1. Intractable nausea and vomiting Symptoms more likely appear to be peripheral vertigo. With his complaints of fullness of the ear possibility of vestibulitis labyrinthitis cannot be ruled out. Will need an MRI of the brain to rule out any acute posterior circulation stroke. Scheduled meclizine add Compazine and Benadryl as needed. PT consult for vertigo. If the MRI of the brain is negative we'll start the patient on steroids for suspected neuronitis.  Pain management: When necessary Tylenol Activity: physical therapy consulted Bowel regimen: last BM prior to arrival Diet: Regular diet DVT Prophylaxis: subcutaneous Heparin  Advance goals of care discussion: Full code  Family Communication: family was present at bedside, at the time of interview. The pt provided permission to discuss medical plan with the family. Opportunity was given to ask question and all questions were answered satisfactorily.   Disposition:  Discharge to likely home depending on symptom control Expected discharge date: 05/11/2016   Consultants: Neurology Procedures: None  Antibiotics: Anti-infectives    None         Intake/Output Summary (Last 24 hours) at 05/09/16 1806 Last data filed at 05/09/16 0445  Gross per 24 hour  Intake      0 ml  Output    581 ml  Net   -581 ml   Filed Weights   05/08/16 1744  Weight: 90.719 kg (200 lb)    Objective: Physical Exam: Filed Vitals:   05/09/16 0415 05/09/16 0615 05/09/16 1033 05/09/16 1412  BP: 129/83 117/71 106/57 107/59  Pulse: 84 81 77 77  Temp: 98.7 F (37.1 C) 98.6 F (37 C) 98.4 F (36.9 C) 98.3 F (36.8 C)  TempSrc: Oral Oral Oral Oral  Resp: Height:      Weight:      SpO2: 99% 99% 93% 95%    General: Alert, Awake and Oriented to Time, Place and Person. Appear in moderate distress Eyes: PERRL, Conjunctiva normal ENT: Oral Mucosa clear moist. Neck: no JVD, noAbnormal Mass Or lumps Cardiovascular: S1 and S2 Present, no Murmur, Peripheral Pulses Present Respiratory: Bilateral Air entry equal and Decreased,  Clear to Auscultation, no Crackles, no wheezes Abdomen: Bowel Sound present, Soft and no tenderness Skin: redness no, no Rash  Extremities: no Pedal edema, no calf tenderness Neurologic: Grossly no focal neuro deficit.Bilaterally Equal motor strength  Data Reviewed: CBC:  Recent Labs Lab 05/08/16 1750 05/09/16 0042  WBC 7.0 9.8  NEUTROABS 3.7  --   HGB 15.7 13.8  HCT 44.8 40.5  MCV 94.9 95.3  PLT 212 211   Basic Metabolic Panel:  Recent Labs Lab 05/08/16 1750 05/09/16 0042  NA 139  --   K 3.5  --   CL 110  --   CO2 22  --  GLUCOSE 139*  --   BUN 17  --   CREATININE 1.01 0.97  CALCIUM 8.9  --     Liver Function Tests:  Recent Labs Lab 05/08/16 1750  AST 30  ALT 22  ALKPHOS 59  BILITOT 0.5  PROT 7.2  ALBUMIN 4.1   No results for input(s): LIPASE, AMYLASE in the last 168 hours. No results for input(s): AMMONIA in the last 168 hours. Coagulation Profile:  Recent Labs Lab 05/08/16 1750  INR 1.01   Cardiac Enzymes:  Recent Labs Lab 05/08/16 1750  TROPONINI <0.03   BNP  (last 3 results) No results for input(s): PROBNP in the last 8760 hours.  CBG:  Recent Labs Lab 05/08/16 1749 05/08/16 1909  GLUCAP 143* 120*    Studies: No results found.   Scheduled Meds: . enoxaparin (LOVENOX) injection  40 mg Subcutaneous Q24H  . meclizine  25 mg Oral TID   Continuous Infusions: . sodium chloride 125 mL/hr at 05/09/16 0019   PRN Meds: acetaminophen **OR** acetaminophen, diphenhydrAMINE, LORazepam, ondansetron (ZOFRAN) IV **OR** ondansetron, prochlorperazine, senna-docusate  Time spent: 30 minutes  Author: Lynden OxfordPranav Trevia Nop, MD [facility] Pager: [phone] 05/09/2016 6:06 PM  If 7PM-7AM, please contact night-coverage at [email or web address], password [facility]

## 2016-05-09 NOTE — Progress Notes (Signed)
Patient was taken to MRI for the 2nd time- unsuccessful- Patient unable to tolerate procedure per MRI tech report.

## 2016-05-09 NOTE — Progress Notes (Signed)
PT Cancellation Note  Patient Details Name: Voncille LoJohnny Asbridge MRN: [id number] DOB: 10/30/1951   Cancelled Treatment:    Reason Eval/Treat Not Completed: Patient not medically ready. Pt unable to open eyes or lay on back due to severe dizziness with onset of nausea/vomitting. Pt did report fullness in L ear and sensation of free floating fluid in L ear. Pt reports symptoms are at their best when he's laying on his R side. Based off subjective discussion suspect pt with acute neuritis and or cerebellar infarct. Awaiting MRI results. PT to await results of MRI to confirm findings prior to putting patient through positions for further vestibular testing due to pt's frequent vomiting at this time. Contacted MD to discuss possible neuritis. PT to re-attempt 5/11 as able.   Marcene BrawnChadwell, Nashayla Telleria Marie 05/09/2016, 11:55 AM  Lewis ShockAshly Lewie Deman, PT, DPT Pager #: [phone] Office #: [phone]

## 2016-05-09 NOTE — Progress Notes (Signed)
SLP Cancellation Note  Patient Details Name: [name] MRN: [id number] DOB: 10/30/1951   Cancelled treatment:       Reason Eval/Treat Not Completed: Other (comment) Pt participating in PT vestibular evaluation. Will f/u as able.   Maxcine HamLaura Paiewonsky, M.A. CCC-SLP [phone]  Maxcine Hamaiewonsky, Alisen Marsiglia 05/09/2016, 11:42 AM

## 2016-05-09 NOTE — Care Management Note (Signed)
Case Management Note  Patient Details  Name: Voncille LoJohnny Virnig MRN: [id number] Date of Birth: 10/29/1951  Subjective/Objective:                    Action/Plan: Patient presented with vertigo, intractable nausea/vomiting. Will follow for discharge needs pending PT/OT evals and physician orders.  Expected Discharge Date:                  Expected Discharge Plan:     In-House Referral:     Discharge planning Services     Post Acute Care Choice:    Choice offered to:     DME Arranged:    DME Agency:     HH Arranged:    HH Agency:     Status of Service:  In process, will continue to follow  Medicare Important Message Given:    Date Medicare IM Given:    Medicare IM give by:    Date Additional Medicare IM Given:    Additional Medicare Important Message give by:     If discussed at Long Length of Stay Meetings, dates discussed:    Additional Comments:  Anda KraftRobarge, Moni Rothrock C, RN 05/09/2016, 9:36 AM [phone]

## 2016-05-10 ENCOUNTER — Observation Stay (HOSPITAL_COMMUNITY): Payer: BC Managed Care – PPO

## 2016-05-10 ENCOUNTER — Encounter (HOSPITAL_COMMUNITY): Payer: Self-pay | Admitting: Physical Medicine and Rehabilitation

## 2016-05-10 DIAGNOSIS — G459 Transient cerebral ischemic attack, unspecified: Secondary | ICD-10-CM | POA: Diagnosis not present

## 2016-05-10 DIAGNOSIS — R42 Dizziness and giddiness: Secondary | ICD-10-CM | POA: Diagnosis not present

## 2016-05-10 DIAGNOSIS — I63531 Cerebral infarction due to unspecified occlusion or stenosis of right posterior cerebral artery: Secondary | ICD-10-CM | POA: Diagnosis not present

## 2016-05-10 DIAGNOSIS — I639 Cerebral infarction, unspecified: Secondary | ICD-10-CM | POA: Diagnosis present

## 2016-05-10 DIAGNOSIS — Z823 Family history of stroke: Secondary | ICD-10-CM | POA: Diagnosis not present

## 2016-05-10 DIAGNOSIS — G4700 Insomnia, unspecified: Secondary | ICD-10-CM | POA: Diagnosis not present

## 2016-05-10 DIAGNOSIS — I69393 Ataxia following cerebral infarction: Secondary | ICD-10-CM | POA: Diagnosis present

## 2016-05-10 DIAGNOSIS — F419 Anxiety disorder, unspecified: Secondary | ICD-10-CM | POA: Diagnosis not present

## 2016-05-10 DIAGNOSIS — M79606 Pain in leg, unspecified: Secondary | ICD-10-CM | POA: Diagnosis not present

## 2016-05-10 DIAGNOSIS — E785 Hyperlipidemia, unspecified: Secondary | ICD-10-CM

## 2016-05-10 DIAGNOSIS — I472 Ventricular tachycardia: Secondary | ICD-10-CM | POA: Diagnosis not present

## 2016-05-10 DIAGNOSIS — R110 Nausea: Secondary | ICD-10-CM | POA: Diagnosis not present

## 2016-05-10 DIAGNOSIS — I63011 Cerebral infarction due to thrombosis of right vertebral artery: Secondary | ICD-10-CM | POA: Diagnosis not present

## 2016-05-10 DIAGNOSIS — I638 Other cerebral infarction: Secondary | ICD-10-CM | POA: Diagnosis not present

## 2016-05-10 DIAGNOSIS — Z87891 Personal history of nicotine dependence: Secondary | ICD-10-CM | POA: Diagnosis not present

## 2016-05-10 DIAGNOSIS — I69398 Other sequelae of cerebral infarction: Secondary | ICD-10-CM | POA: Diagnosis not present

## 2016-05-10 DIAGNOSIS — I10 Essential (primary) hypertension: Secondary | ICD-10-CM | POA: Diagnosis present

## 2016-05-10 DIAGNOSIS — R1110 Vomiting, unspecified: Secondary | ICD-10-CM | POA: Diagnosis not present

## 2016-05-10 DIAGNOSIS — H538 Other visual disturbances: Secondary | ICD-10-CM | POA: Diagnosis present

## 2016-05-10 LAB — ECHOCARDIOGRAM COMPLETE
Height: 72 in
Weight: 3200 oz

## 2016-05-10 LAB — HEMOGLOBIN A1C
Hgb A1c MFr Bld: 5.3 % (ref 4.8–5.6)
Mean Plasma Glucose: 105 mg/dL

## 2016-05-10 MED ORDER — ATORVASTATIN CALCIUM 40 MG PO TABS
40.0000 mg | ORAL_TABLET | Freq: Every day | ORAL | Status: DC
  Administered 2016-05-10 – 2016-05-14 (×5): 40 mg via ORAL
  Filled 2016-05-10 (×5): qty 1

## 2016-05-10 MED ORDER — ASPIRIN EC 325 MG PO TBEC
325.0000 mg | DELAYED_RELEASE_TABLET | Freq: Every day | ORAL | Status: DC
  Administered 2016-05-10 – 2016-05-15 (×6): 325 mg via ORAL
  Filled 2016-05-10 (×6): qty 1

## 2016-05-10 MED ORDER — IOPAMIDOL (ISOVUE-370) INJECTION 76%
INTRAVENOUS | Status: AC
  Administered 2016-05-10: 50 mL
  Filled 2016-05-10: qty 50

## 2016-05-10 NOTE — Progress Notes (Signed)
Inpatient Rehabilitation  Per PT request, patient was screened by Fae PippinMelissa Roxan Yamamoto for appropriateness for an Inpatient Acute Rehab consult.  At this time we are recommending an Inpatient Rehab consult.  Text paged Dr. Allena KatzPatel to request an order.  Please order if you are agreeable.    Charlane FerrettiMelissa Christianna Belmonte, M.A., CCC/SLP Admission Coordinator  [facility]  Cell [phone]

## 2016-05-10 NOTE — Progress Notes (Signed)
STROKE TEAM PROGRESS NOTE   HISTORY OF PRESENT ILLNESS [name] is an 65 y.o. male with no previously documented medical history abnormal medications who was brought to the emergency room and code stroke status following acute onset of vertigo and nausea as well as feeling of weakness involving upper extremities primarily. No facial droop was noted. There was also no slurred speech. He has no previous history of stroke nor TIA and has not been on antiplatelet therapy. CT scan of his head showed no acute intracranial abnormality. Clinical exam was unremarkable except for nystagmus on right lateral gaze. MRI of the brain was attempted but could not be obtained because the patient had much worse nausea and abdominal discomfort on lying flat. He was LKW 05/08/2016 at 430p. He was not treated with tPA due to no clear focal deficits.    SUBJECTIVE (INTERVAL HISTORY) His wife is at the bedside.  She recounted his HPI of symptom onset, dizziness, 911 call, [facility]. Dizzy, nausea. No significant medical history. She wonders why patient did not receive TPA.. Overall he feels his condition is improved. He remains with significant complaints of dizziness on motion but no more N/V.   OBJECTIVE Temp:  [98 F (36.7 C)-99.5 F (37.5 C)] 98 F (36.7 C) (05/11 0525) Pulse Rate:  [70-82] 70 (05/11 0525) Cardiac Rhythm:  [-] Normal sinus rhythm;Heart block (05/11 0700) Resp:  [16-18] 16 (05/11 0525) BP: (106-131)/(57-76) 131/69 mmHg (05/11 0525) SpO2:  [93 %-99 %] 98 % (05/11 0525)  CBC:  Recent Labs Lab 05/08/16 1750 05/09/16 0042  WBC 7.0 9.8  NEUTROABS 3.7  --   HGB 15.7 13.8  HCT 44.8 40.5  MCV 94.9 95.3  PLT 212 211    Basic Metabolic Panel:  Recent Labs Lab 05/08/16 1750 05/09/16 0042  NA 139  --   K 3.5  --   CL 110  --   CO2 22  --   GLUCOSE 139*  --   BUN 17  --   CREATININE 1.01 0.97  CALCIUM 8.9  --     Lipid Panel:    Component Value Date/Time    CHOL 165 05/09/2016 0042   TRIG 34 05/09/2016 0042   HDL 41 05/09/2016 0042   CHOLHDL 4.0 05/09/2016 0042   VLDL 7 05/09/2016 0042   LDLCALC 117* 05/09/2016 0042   HgbA1c:  Lab Results  Component Value Date   HGBA1C 5.3 05/09/2016   Urine Drug Screen:    Component Value Date/Time   LABOPIA NONE DETECTED 05/08/2016 2040   COCAINSCRNUR NONE DETECTED 05/08/2016 2040   LABBENZ NONE DETECTED 05/08/2016 2040   AMPHETMU NONE DETECTED 05/08/2016 2040   THCU NONE DETECTED 05/08/2016 2040   LABBARB NONE DETECTED 05/08/2016 2040      IMAGING  Ct Head Wo Contrast 05/08/2016   Mildly limited examination demonstrating mild atrophy with no acute abnormality.   Mr Brain Wo Contrast 05/10/2016   Large acute RIGHT cerebellar infarct, nonhemorrhagic, PICA territory. Much smaller infarct of the LEFT medial inferior cerebellum, also PICA territory. Mild mass effect on the fourth ventricle. The patient should be observed for signs of developing obstructive hydrocephalus. A call is in to the care team.   CTA head ane neck - Acute PICA infarct right greater than left. There appears to be decreased flow in the right PICA which may be related to the acute infarct and partial occlusion. No filling defect or thrombus identified. No vertebral artery stenosis or dissection is  identified as a cause of the infarct. No significant carotid or vertebral artery stenosis in the neck. Negative for dissection.  TTE - - Left ventricle: The cavity size was normal. Wall thickness was  increased increased in a pattern of mild to moderate LVH.  Systolic function was normal. The estimated ejection fraction was  in the range of 55% to 60%. Wall motion was normal; there were no  regional wall motion abnormalities. Doppler parameters are  consistent with abnormal left ventricular relaxation (grade 1  diastolic dysfunction). - Mitral valve: There was mild regurgitation. - Left atrium: The atrium was mildly  dilated. Impressions: - No cardiac source of emboli was indentified.   PHYSICAL EXAM  Temp:  [98 F (36.7 C)-99.6 F (37.6 C)] 99.6 F (37.6 C) (05/11 1753) Pulse Rate:  [58-82] 58 (05/11 1753) Resp:  [16-18] 17 (05/11 1753) BP: (129-139)/(68-76) 139/68 mmHg (05/11 1753) SpO2:  [96 %-99 %] 99 % (05/11 1753)  General - Well nourished, well developed, in no apparent distress, but sleepy but arousable and cooperative on exam.  Ophthalmologic - Fundi not visualized due to noncooperation.  Cardiovascular - Regular rate and rhythm with no murmur.  Mental Status -  Level of arousal and orientation to time, place, and person were intact. Language including expression, naming, repetition, comprehension was assessed and found intact. Fund of Knowledge was assessed and was intact.  Cranial Nerves II - XII - II - Visual field intact OU. III, IV, VI - Extraocular movements intact. V - Facial sensation intact bilaterally. VII - Facial movement intact bilaterally. VIII - Hearing & vestibular intact bilaterally, no nystagmus. X - Palate elevates symmetrically. XI - Chin turning & shoulder shrug intact bilaterally. XII - Tongue protrusion intact.  Motor Strength - The patient's strength was normal in all extremities and pronator drift was absent.  Bulk was normal and fasciculations were absent.   Motor Tone - Muscle tone was assessed at the neck and appendages and was normal.  Reflexes - The patient's reflexes were 1+ in all extremities and he had no pathological reflexes.  Sensory - Light touch, temperature/pinprick were assessed and were symmetrical.    Coordination - The patient had normal movements in the hands with no ataxia or dysmetria. No able to test LEs due to pt not willing to lay flat. Tremor was absent.  Gait and Station - not tested due to safety concerns.   ASSESSMENT/PLAN Mr. [name] is a 65 y.o. male with no significant past medical history presenting with  sudden onset vertigo and nausea. He did not receive IV t-PA.   Stroke:  bilateral PICA infarct, R>L, consistent with right PICA occlusion or high grade stenosis.  Resultant  Dizziness, nausea  MRI  bilateral PICA infarct, R>L  CTA head and neck possible right PICA occlusion vs. High grade stenosis, no VA dissection or stenosis   Repeat CT head Saturday am  2D Echo  unremarkable  LDL 117  HgbA1c 5.3  Lovenox 40 mg sq daily for VTE prophylaxis  Diet clear liquid Room service appropriate?: Yes; Fluid consistency:: Thin. On thin liquid diet given nausea.  No antithrombotic prior to admission, now on aspirin 325 mg daily.   Patient counseled to be compliant with his antithrombotic medications  Ongoing aggressive stroke risk factor management  Therapy recommendations:  pending   Disposition:  pending  (lives with wife, works as a Midwife for [employer])  Hyperlipidemia  Home meds:  No statin  LDL 117, goal <  70  Now on lipitor 40 mg daily  Continue statin at discharge  Other Stroke Risk Factors  Advanced age  Hospital day # 1  Marvel PlanJindong Oval Cavazos, MD PhD Stroke Neurology 05/10/2016 7:09 PM     To contact Stroke Continuity provider, please refer to [email or web address]. After hours, contact General Neurology

## 2016-05-10 NOTE — Progress Notes (Signed)
[facility] Progress Note  Patient: [name] ZOX:[id number]   PCP: Mickie Hillier, MD DOB: January 18, 1951   DOA: 05/08/2016   DOS: 05/10/2016   Date of Service: the patient was seen and examined on 05/10/2016  Subjective: Patient mentions his dizziness and nausea is getting better. No further vomiting. Tolerating oral diet but minimal oral intake. No diarrhea no constipation. No further worsening of neuro deficit Nutrition: Minimal oral intake due to persistent nausea  Brief hospital course: Patient was admitted on 05/08/2016, with complaint of nausea and vertigo, was found to have likely posterior circulation stroke. PTOT and speech therapy consulted. Currently further plan is to further stroke workup.  Assessment and Plan: 1. Acute right PCA stroke (HCC)  Dyslipidemia Intractable nausea and vomiting  Initially felt to be peripheral vertigo. Matter of the brain this morning is positive for right PCA territory stroke. Aspirin added Lipitor added. CT angiogram head and neck added. Echocardiogram added. PTOT and speech therapy continues to follow the patient. LDL 117. Hemoglobin A1c normal Allow permissive hypertension at present. Scheduled meclizine add Compazine and Benadryl as needed. Will need 30 day loop monitor as an outpatient due to bilateral stroke.  If the patient has any change in mental status during the course of hospitalization patient will need a repeat CT scan to rule out hydrocephalus. The patient has persistent headache she will need a repeat CT scan to rule out hydrocephalus. Regardless a CT scan repeated in 48 hours to identify potential for further worsening  Pain management: When necessary Tylenol Activity: physical therapy recommends CIR Bowel regimen: last BM 05/18/2016 Diet: Cardiac diet DVT Prophylaxis: subcutaneous Heparin  Advance goals of care discussion: Full code  Family Communication: family was present at bedside, at the time of interview.  The pt provided permission to discuss medical plan with the family. Opportunity was given to ask question and all questions were answered satisfactorily.   Disposition:  Discharge to CIR Expected discharge date: 05/13/2016  Consultants: Neurology Procedures: Echocardiogram pending  Antibiotics: Anti-infectives    None        Intake/Output Summary (Last 24 hours) at 05/10/16 1826 Last data filed at 05/10/16 0526  Gross per 24 hour  Intake    120 ml  Output   1000 ml  Net   -880 ml   Filed Weights   05/08/16 1744  Weight: 90.719 kg (200 lb)    Objective: Physical Exam: Filed Vitals:   05/10/16 0205 05/10/16 0525 05/10/16 1446 05/10/16 1753  BP: 129/70 131/69 136/72 139/68  Pulse: 82 70 71 58  Temp: 98.6 F (37 C) 98 F (36.7 C) 99.3 F (37.4 C) 99.6 F (37.6 C)  TempSrc: Oral Oral Oral Axillary  Resp: Height:      Weight:      SpO2: 99% 98% 96% 99%    General: Alert, Awake and Oriented to Time, Place and Person. Appear in moderate distress Eyes: PERRL, Conjunctiva normal ENT: Oral Mucosa clear moist. Neck: no JVD, no Abnormal Mass Or lumps Cardiovascular: S1 and S2 Present, no Murmur, Peripheral Pulses Present Respiratory: Bilateral Air entry equal and Decreased,  Clear to Auscultation, no Crackles, no wheezes Abdomen: Bowel Sound present, Soft and no tenderness Skin: redness no, no Rash  Extremities: no Pedal edema, no calf tenderness Neurologic: Grossly no focal neuro deficit. Bilaterally Equal motor strength  Data Reviewed: CBC:  Recent Labs Lab 05/08/16 1750 05/09/16 0042  WBC 7.0 9.8  NEUTROABS 3.7  --   HGB  15.7 13.8  HCT 44.8 40.5  MCV 94.9 95.3  PLT 212 211   Basic Metabolic Panel:  Recent Labs Lab 05/08/16 1750 05/09/16 0042  NA 139  --   K 3.5  --   CL 110  --   CO2 22  --   GLUCOSE 139*  --   BUN 17  --   CREATININE 1.01 0.97  CALCIUM 8.9  --     Liver Function Tests:  Recent Labs Lab 05/08/16 1750  AST  30  ALT 22  ALKPHOS 59  BILITOT 0.5  PROT 7.2  ALBUMIN 4.1   No results for input(s): LIPASE, AMYLASE in the last 168 hours. No results for input(s): AMMONIA in the last 168 hours. Coagulation Profile:  Recent Labs Lab 05/08/16 1750  INR 1.01   Cardiac Enzymes:  Recent Labs Lab 05/08/16 1750  TROPONINI <0.03   BNP (last 3 results) No results for input(s): PROBNP in the last 8760 hours.  CBG:  Recent Labs Lab 05/08/16 1749 05/08/16 1909  GLUCAP 143* 120*    Studies: Ct Angio Head W/cm &/or Wo Cm  05/10/2016  CLINICAL DATA:  Acute PICA stroke bilaterally right greater than left EXAM: CT ANGIOGRAPHY HEAD AND NECK TECHNIQUE: Multidetector CT imaging of the head and neck was performed using the standard protocol during bolus administration of intravenous contrast. Multiplanar CT image reconstructions and MIPs were obtained to evaluate the vascular anatomy. Carotid stenosis measurements (when applicable) are obtained utilizing NASCET criteria, using the distal internal carotid diameter as the denominator. CONTRAST:  50 mL Isovue 370 IV COMPARISON:  MRI 05/10/2016 FINDINGS: CTA NECK Aortic arch: Mild atherosclerotic aortic arch without aneurysm or stenosis. Aortic arch is uncoiled. Proximal great vessels are tortuous but widely patent without stenosis. Lung apices clear Right carotid system: Right carotid artery widely patent and normal. Right carotid bifurcation normal. No stenosis or dissection Left carotid system: Left carotid widely patent. Negative for stenosis or dissection. No significant atherosclerotic disease Vertebral arteries:Both vertebral arteries widely patent to the basilar without stenosis. No dissection or stenosis. Skeleton: No acute abnormality Other neck: Multiple thyroid nodules. The largest nodule in the left upper pole is 13 mm. Thyroid ultrasound recommended. No adenopathy in the neck. CTA HEAD Anterior circulation: Cavernous carotid widely patent bilaterally  without significant stenosis. Anterior and middle cerebral arteries widely patent bilaterally without stenosis. Posterior circulation: Both vertebral arteries patent to the basilar without stenosis. Left PICA patent. Right PICA small and may have decreased flow due to acute right PICA infarct. No thrombus identified. Basilar patent. Superior cerebellar and posterior cerebral arteries patent bilaterally. Fetal origin right posterior cerebral artery. Venous sinuses: Patent Anatomic variants: None Delayed phase: Normal enhancement on delayed imaging. The infarct does not enhance. No mass lesion. IMPRESSION: Acute PICA infarct right greater than left. There appears to be decreased flow in the right PICA which may be related to the acute infarct and partial occlusion. No filling defect or thrombus identified. No vertebral artery stenosis or dissection is identified as a cause of the infarct. No significant carotid or vertebral artery stenosis in the neck. Negative for dissection. Electronically Signed   By: Marlan Palauharles  Clark M.D.   On: 05/10/2016 18:15   Ct Head Wo Contrast  05/10/2016  CLINICAL DATA:  Cerebellar stroke.  Dizziness and blurred vision EXAM: CT HEAD WITHOUT CONTRAST TECHNIQUE: Contiguous axial images were obtained from the base of the skull through the vertex without intravenous contrast. COMPARISON:  MRI 05/10/2016 FINDINGS: Hypodensity in the  right inferior cerebellum compatible with acute infarct. Smaller area of hypodensity in the left medial inferior cerebellum compatible with acute infarct. These areas show restricted diffusion on MRI. No associated hemorrhage Ventricle size normal. Negative for mass or edema. No shift of the midline structures. Calvarium intact. IMPRESSION: Acute PICA infarct bilaterally right greater than left. Negative for hemorrhage. Electronically Signed   By: Marlan Palau M.D.   On: 05/10/2016 17:35   Ct Angio Neck W/cm &/or Wo/cm  05/10/2016  CLINICAL DATA:  Acute PICA  stroke bilaterally right greater than left EXAM: CT ANGIOGRAPHY HEAD AND NECK TECHNIQUE: Multidetector CT imaging of the head and neck was performed using the standard protocol during bolus administration of intravenous contrast. Multiplanar CT image reconstructions and MIPs were obtained to evaluate the vascular anatomy. Carotid stenosis measurements (when applicable) are obtained utilizing NASCET criteria, using the distal internal carotid diameter as the denominator. CONTRAST:  50 mL Isovue 370 IV COMPARISON:  MRI 05/10/2016 FINDINGS: CTA NECK Aortic arch: Mild atherosclerotic aortic arch without aneurysm or stenosis. Aortic arch is uncoiled. Proximal great vessels are tortuous but widely patent without stenosis. Lung apices clear Right carotid system: Right carotid artery widely patent and normal. Right carotid bifurcation normal. No stenosis or dissection Left carotid system: Left carotid widely patent. Negative for stenosis or dissection. No significant atherosclerotic disease Vertebral arteries:Both vertebral arteries widely patent to the basilar without stenosis. No dissection or stenosis. Skeleton: No acute abnormality Other neck: Multiple thyroid nodules. The largest nodule in the left upper pole is 13 mm. Thyroid ultrasound recommended. No adenopathy in the neck. CTA HEAD Anterior circulation: Cavernous carotid widely patent bilaterally without significant stenosis. Anterior and middle cerebral arteries widely patent bilaterally without stenosis. Posterior circulation: Both vertebral arteries patent to the basilar without stenosis. Left PICA patent. Right PICA small and may have decreased flow due to acute right PICA infarct. No thrombus identified. Basilar patent. Superior cerebellar and posterior cerebral arteries patent bilaterally. Fetal origin right posterior cerebral artery. Venous sinuses: Patent Anatomic variants: None Delayed phase: Normal enhancement on delayed imaging. The infarct does not  enhance. No mass lesion. IMPRESSION: Acute PICA infarct right greater than left. There appears to be decreased flow in the right PICA which may be related to the acute infarct and partial occlusion. No filling defect or thrombus identified. No vertebral artery stenosis or dissection is identified as a cause of the infarct. No significant carotid or vertebral artery stenosis in the neck. Negative for dissection. Electronically Signed   By: Marlan Palau M.D.   On: 05/10/2016 18:15   Mr Brain Wo Contrast  05/10/2016  CLINICAL DATA:  Severe dizziness with nausea and vomiting. Benign positional vertigo is suspected. EXAM: MRI HEAD WITHOUT CONTRAST TECHNIQUE: Multiplanar, multiecho pulse sequences of the brain and surrounding structures were obtained without intravenous contrast. COMPARISON:  Motion degraded CT head 05/08/2016. FINDINGS: The study had to be terminated prematurely as the patient required removal for severe dizziness with nausea and vomiting. There is a large area of acute infarction affecting the RIGHT inferior cerebellum. This involves most of the territory of the posterior inferior cerebellar artery on the RIGHT. Smaller areas of acute infarction affect the inferior cerebellum on the LEFT, medial PICA territory. Within limits for detection on MR, no hemorrhage. Cytotoxic edema is developing in the posterior fossa, the RIGHT tonsil is swollen, and there is mild mass effect on the dorsolateral fourth ventricle. Mild cerebral and cerebellar atrophy. Mild subcortical and periventricular T2 and FLAIR hyperintensities, likely  chronic microvascular ischemic change. Flow voids are maintained in the BILATERAL vertebral arteries and basilar artery; LEFT vertebral is slightly larger. Neither demonstrate signs of dissection or occlusion. No midline pituitary abnormality. Cerebellar tonsils are at the level of the foramen magnum without impaction at this time, although the RIGHT is lower. IMPRESSION: Large acute  RIGHT cerebellar infarct, nonhemorrhagic, PICA territory. Much smaller infarct of the LEFT medial inferior cerebellum, also PICA territory. Mild mass effect on the fourth ventricle. The patient should be observed for signs of developing obstructive hydrocephalus. A call is in to the care team. Electronically Signed   By: Elsie Stain M.D.   On: 05/10/2016 09:29     Scheduled Meds: . aspirin EC  325 mg Oral Daily  . atorvastatin  40 mg Oral q1800  . enoxaparin (LOVENOX) injection  40 mg Subcutaneous Q24H  . meclizine  25 mg Oral TID   Continuous Infusions:   PRN Meds: acetaminophen **OR** acetaminophen, diphenhydrAMINE, LORazepam, ondansetron (ZOFRAN) IV **OR** ondansetron, prochlorperazine, senna-docusate  Time spent: 30 minutes  Author: Lynden Oxford, MD [facility] Pager: [phone] 05/10/2016 6:26 PM  If 7PM-7AM, please contact night-coverage at [email or web address], password [facility]

## 2016-05-10 NOTE — Consult Note (Signed)
Physical Medicine and Rehabilitation Consult   Reason for Consult:  Dizziness Referring Physician: Dr. Allena Katz   HPI: [name] is a 65 y.o. male in relatively good health who was admitted on 05/09 with sudden onset of dizziness, N/V and weakness. CT head negative and MRI brain aborted due to nausea and abdominal discomfort.  Neurology evaluated patient and he was found to have no focal deficits except nystagmus on right lateral gaze. He has had significant vertigo was treated for dizziness.  MRI brain done today showing large area of acute infarct in right cerebellar infarct involving most of PICA on right and small areas of acute infarct in left inferior cerebellum with mild mass effect on 4th ventricle. 2D echo with EF 55-60% with grade 1 diastolic dysfunction and mild MVR.  Bilateral PICA infarcts felt to be embolic of unknown etiology and patient on ASA for secondary stroke prevention. Patient with resultant dizziness and problems with ambulation due to vestibular issues. PT evaluation done today and CIR recommended for follow up therapy.   Po intake has been limited to water due to nausea. He has been unable to sit up in bed or chair due to discomfort.   Review of Systems  Constitutional: Positive for malaise/fatigue.  HENT: Negative for hearing loss.   Eyes: Negative for blurred vision and double vision.  Respiratory: Negative for cough and sputum production.   Cardiovascular: Negative for chest pain and palpitations.  Gastrointestinal: Positive for nausea. Negative for heartburn, abdominal pain and constipation.  Genitourinary: Negative for dysuria and urgency.  Musculoskeletal: Negative for myalgias and back pain.  Neurological: Positive for dizziness and weakness. Negative for sensory change, focal weakness and headaches.  Psychiatric/Behavioral: The patient is not nervous/anxious and does not have insomnia.       History reviewed. No pertinent past medical history.     History reviewed. No pertinent past surgical history.    Family History  Problem Relation Age of Onset  . Stroke Mother 92  . Diabetes Father   . 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 He quit tobacco 33 years ago.  He has never used smokeless tobacco. He reports that he does not drink alcohol or use illicit drugs.    Allergies: No Known Allergies    No prescriptions prior to admission    Home: Home Living Family/patient expects to be discharged to:: Private residence Living Arrangements: Spouse/significant other Available Help at Discharge: Family Type of Home: House Home Access: Stairs to enter Secretary/administrator of Steps: 2 Home Layout: One level Bathroom Shower/Tub: Engineer, manufacturing systems: Standard Home Equipment: None  Functional History: Prior Function Level of Independence: Independent Functional Status:  Mobility: Bed Mobility Overal bed mobility: Needs Assistance Bed Mobility: Supine to Sit, Sit to Supine Supine to sit: Supervision Sit to supine: Supervision General bed mobility comments: increased time, heavy rail use to come upright, no assist to supine Transfers Overall transfer level: Needs assistance Equipment used: Rolling walker (2 wheeled) Transfers: Sit to/from Stand Sit to Stand: Min assist General transfer comment: assist for balance/cues for focal point to use visual fixation due to dizziness Ambulation/Gait Ambulation/Gait assistance: Min assist Ambulation Distance (Feet): 22 Feet Assistive device: Rolling walker (2 wheeled) Gait Pattern/deviations: Step-through pattern, Trunk flexed, Decreased stride length General Gait Details: cues again for  focal point, but pt rushing due to dizziness; assisted back to EOB and cues not to lie down rapidly and use focal target to help diminish symptoms.    ADL:     Cognition: Cognition Overall Cognitive Status: No family/caregiver present to determine baseline cognitive functioning ([facility] and was 5/10; was asleep earlier) Orientation Level: Oriented X4 Cognition Arousal/Alertness: Awake/alert Behavior During Therapy: Flat affect Overall Cognitive Status: No family/caregiver present to determine baseline cognitive functioning ([facility] and was 5/10; was asleep earlier)   Blood pressure 136/72, pulse 71, temperature 99.3 F (37.4 C), temperature source Oral, resp. rate 18, height 6' (1.829 m), weight 90.719 kg (200 lb), SpO2 96 %. Physical Exam  Nursing note and vitals reviewed. Constitutional: He is oriented to person, place, and time. He appears well-developed and well-nourished.  HENT:  Head: Normocephalic and atraumatic.  Eyes: Conjunctivae are normal. Pupils are equal, round, and reactive to light.  Neck: Normal range of motion. Neck supple.  Cardiovascular: Normal rate and regular rhythm.   No murmur heard. Respiratory: Effort normal and breath sounds normal. He has no wheezes.  GI: Soft. Bowel sounds are normal. He exhibits no distension. There is no tenderness.  Musculoskeletal: He exhibits no edema or tenderness.  Neurological: He is alert and oriented to person, place, and time. He displays normal reflexes. He exhibits normal muscle tone.  Tended to keep eyes closed. Able to follow basic commands without difficulty. No gross limb ataxia. Becomes nauseas with confrontation. 1-2 beats of nystagmus when tracking to right.  Strength 4+ RUE and 4 LUE. LE: 4-/5 prox to 4/5 distally..  Skin: Skin is warm and dry.  Psychiatric: He has a normal mood and affect. His behavior is normal. Judgment and thought content normal.    No results found for this or any previous visit (from the past 24 hour(s)). Ct Head Wo Contrast  05/08/2016  CLINICAL DATA:  Sudden onset dizziness, vomiting and bilateral upper extremity  weakness. EXAM: CT HEAD WITHOUT CONTRAST TECHNIQUE: Contiguous axial images were obtained from the base of the skull through the vertex without intravenous contrast. COMPARISON:  None. FINDINGS: There is some limitation due to patient motion and streak artifacts, despite repeat imaging. Diffusely enlarged ventricles and subarachnoid spaces. No intracranial hemorrhage, mass lesion or CT evidence of acute infarction. Unremarkable bones and included paranasal sinuses. IMPRESSION: Mildly limited examination demonstrating mild atrophy with no acute abnormality. These results were called by telephone at the time of interpretation on 05/08/2016 at 6:09 pm to Jose PersiaKelly Conner, the nurse working with Rutha BouchardAbigal Harris, PA-C , who verbally acknowledged these results. Electronically Signed   By: Beckie SaltsSteven  Reid M.D.   On: 05/08/2016 18:11   Mr Brain Wo Contrast  05/10/2016  CLINICAL DATA:  Severe dizziness with nausea and vomiting. Benign positional vertigo is suspected. EXAM: MRI HEAD WITHOUT CONTRAST TECHNIQUE: Multiplanar, multiecho pulse sequences of the brain and surrounding structures were obtained without intravenous contrast. COMPARISON:  Motion degraded CT head 05/08/2016. FINDINGS: The study had to be terminated prematurely as the patient required removal for severe dizziness with nausea and vomiting. There is a large area of acute infarction affecting the RIGHT inferior cerebellum. This involves most of the territory of the posterior inferior cerebellar artery on the RIGHT. Smaller areas of acute infarction affect the inferior cerebellum on the LEFT, medial PICA territory. Within limits for detection on MR, no hemorrhage. Cytotoxic edema is developing in the posterior fossa, the RIGHT tonsil is swollen, and there  is mild mass effect on the dorsolateral fourth ventricle. Mild cerebral and cerebellar atrophy. Mild subcortical and periventricular T2 and FLAIR hyperintensities, likely chronic microvascular ischemic change. Flow  voids are maintained in the BILATERAL vertebral arteries and basilar artery; LEFT vertebral is slightly larger. Neither demonstrate signs of dissection or occlusion. No midline pituitary abnormality. Cerebellar tonsils are at the level of the foramen magnum without impaction at this time, although the RIGHT is lower. IMPRESSION: Large acute RIGHT cerebellar infarct, nonhemorrhagic, PICA territory. Much smaller infarct of the LEFT medial inferior cerebellum, also PICA territory. Mild mass effect on the fourth ventricle. The patient should be observed for signs of developing obstructive hydrocephalus. A call is in to the care team. Electronically Signed   By: Elsie Stain M.D.   On: 05/10/2016 09:29    Assessment/Plan: Diagnosis: Right>left cerebellar infarcts 1. Does the need for close, 24 hr/day medical supervision in concert with the patient's rehab needs make it unreasonable for this patient to be served in a less intensive setting? Yes 2. Co-Morbidities requiring supervision/potential complications: post-stroke sequelae 3. Due to bladder management, bowel management, safety, skin/wound care, disease management, medication administration, pain management and patient education, does the patient require 24 hr/day rehab nursing? Yes 4. Does the patient require coordinated care of a physician, rehab nurse, PT (1-2 hrs/day, 5 days/week) and OT (1-2 hrs/day, 5 days/week) to address physical and functional deficits in the context of the above medical diagnosis(es)? Yes Addressing deficits in the following areas: balance, endurance, locomotion, strength, transferring, bowel/bladder control, bathing, dressing, feeding, grooming, toileting and psychosocial support 5. Can the patient actively participate in an intensive therapy program of at least 3 hrs of therapy per day at least 5 days per week? Yes 6. The potential for patient to make measurable gains while on inpatient rehab is excellent 7. Anticipated  functional outcomes upon discharge from inpatient rehab are modified independent  with PT, modified independent with OT, n/a with SLP. 8. Estimated rehab length of stay to reach the above functional goals is: 7-9 days 9. Does the patient have adequate social supports and living environment to accommodate these discharge functional goals? Yes 10. Anticipated D/C setting: Home 11. Anticipated post D/C treatments: HH therapy and Outpatient therapy 12. Overall Rehab/Functional Prognosis: excellent  RECOMMENDATIONS: This patient's condition is appropriate for continued rehabilitative care in the following setting: CIR Patient has agreed to participate in recommended program. Yes Note that insurance prior authorization may be required for reimbursement for recommended care.  Comment: Rehab Admissions Coordinator to follow up. Pt was very active prior to admission and motivated to rehab from these infarcts  Thanks,  Ranelle Oyster, MD, [place]     05/10/2016

## 2016-05-10 NOTE — Progress Notes (Signed)
SLP Cancellation Note  Patient Details Name: [name] MRN: [id number] DOB: 02/28/1951   Cancelled treatment:        Received .05 Ativan at 0730 for MRI. Pt soundly asleep, wife present. When awake, pt continues to have significant nausea. Recommend continue liquid diet. Will see pt next date.    Royce MacadamiaLitaker, Arturo Freundlich Willis 05/10/2016, 9:31 AM   Breck CoonsLisa Willis Lonell FaceLitaker M.Ed [employer] Pager [phone]

## 2016-05-10 NOTE — Evaluation (Signed)
Physical Therapy Evaluation Patient Details Name: Gaston Dase MRN: [id number] DOB: 11-02-1951 Today's Date: 05/10/2016   History of Present Illness  Nahom Geiler is an 65 y.o. male with no previously documented medical history abnormal medications who was brought to the emergency room and code stroke status following acute onset of vertigo and nausea as well as feeling of weakness involving upper extremities primarily.  MRI positive for Large acute R cerebellar infarct PICA terriotory and smaller L medial inferior cerebellar infarct also PICA terriotory.  Clinical Impression  Patient presents with decreased independence with mobility due to deficits listed in PT problem list.  He will benefit from skilled PT in the acute setting to allow improved tolerance to mobility and progress for d/c home following CIR level rehab stay.     Follow Up Recommendations CIR    Equipment Recommendations  Rolling walker with 5" wheels    Recommendations for Other Services Rehab consult;OT consult     Precautions / Restrictions Precautions Precautions: Fall      Mobility  Bed Mobility Overal bed mobility: Needs Assistance Bed Mobility: Supine to Sit;Sit to Supine     Supine to sit: Supervision Sit to supine: Supervision   General bed mobility comments: increased time, heavy rail use to come upright, no assist to supine  Transfers Overall transfer level: Needs assistance Equipment used: Rolling walker (2 wheeled) Transfers: Sit to/from Stand Sit to Stand: Min assist         General transfer comment: assist for balance/cues for focal point to use visual fixation due to dizziness  Ambulation/Gait Ambulation/Gait assistance: Min assist Ambulation Distance (Feet): 22 Feet Assistive device: Rolling walker (2 wheeled) Gait Pattern/deviations: Step-through pattern;Trunk flexed;Decreased stride length     General Gait Details: cues again for focal point, but pt rushing due to dizziness;  assisted back to EOB and cues not to lie down rapidly and use focal target to help diminish symptoms.  Stairs            Wheelchair Mobility    Modified Rankin (Stroke Patients Only) Modified Rankin (Stroke Patients Only) Pre-Morbid Rankin Score: No symptoms Modified Rankin: Moderately severe disability     Balance Overall balance assessment: Needs assistance   Sitting balance-Leahy Scale: Good     Standing balance support: Bilateral upper extremity supported Standing balance-Leahy Scale: Poor Standing balance comment: Ue support for balance                             Pertinent Vitals/Pain Pain Assessment: No/denies pain    Home Living Family/patient expects to be discharged to:: Private residence Living Arrangements: Spouse/significant other Available Help at Discharge: Family Type of Home: House Home Access: Stairs to enter   Secretary/administrator of Steps: 2 Home Layout: One level Home Equipment: None      Prior Function Level of Independence: Independent               Hand Dominance        Extremity/Trunk Assessment   Upper Extremity Assessment: RUE deficits/detail;LUE deficits/detail RUE Deficits / Details: AROM WFL, strength shoulder flexcion 4/5, elbow flex/extension 4/5, grip strength weak     LUE Deficits / Details: AROM WFL, strength shoulder flexcion 4/5, elbow flex/extension 4/5, grip strength weak   Lower Extremity Assessment: RLE deficits/detail;LLE deficits/detail RLE Deficits / Details: AROM WFL, strength hip flexion 4-/5, knee extension 4+/5, ankle DF 4/5, reports sensation intact LLE Deficits / Details: AROM WFL, strength hip  flexion 4-/5, knee extension 4+/5, ankle DF 4/5; reports sensation intact     Communication   Communication: No difficulties  Cognition Arousal/Alertness: Awake/alert Behavior During Therapy: Flat affect Overall Cognitive Status: No family/caregiver present to determine baseline cognitive  functioning ([facility]; was asleep earlier)                      General Comments General comments (skin integrity, edema, etc.): does report double vision at times; none currently; able to move eyes in all quadrants this session and noted only mild L eyelid drooping    Exercises        Assessment/Plan    PT Assessment Patient needs continued PT services  PT Diagnosis Difficulty walking;Abnormality of gait;Other (comment) (Central vertigo)   PT Problem List Decreased strength;Decreased activity tolerance;Decreased balance;Decreased mobility;Decreased knowledge of use of DME;Impaired sensation;Other (comment) (dizziness)  PT Treatment Interventions DME instruction;Balance training;Gait training;Neuromuscular re-education;Stair training;Functional mobility training;Patient/family education;Therapeutic activities;Therapeutic exercise (vestibular rehab)   PT Goals (Current goals can be found in the Care Plan section) Acute Rehab PT Goals Patient Stated Goal: To help dizziness PT Goal Formulation: With patient Time For Goal Achievement: 05/17/16 Potential to Achieve Goals: Good    Frequency Min 4X/week   Barriers to discharge        Co-evaluation               End of Session Equipment Utilized During Treatment: Gait belt Activity Tolerance: Other (comment);Patient limited by fatigue (limited by dizziness) Patient left: in bed;with call bell/phone within reach;with bed alarm set      Functional Assessment Tool Used: Clinical Judgement Functional Limitation: Mobility: Walking and moving around Mobility: Walking and Moving Around Current Status ([id number]): At least 40 percent but less than 60 percent impaired, limited or restricted Mobility: Walking and Moving Around Goal Status [phone]([phone]): At least 20 percent but less than 40 percent impaired, limited or restricted    Time: 1331-1356 PT Time Calculation (min) (ACUTE ONLY): 25 min   Charges:    PT Evaluation $PT Eval Moderate Complexity: 1 Procedure PT Treatments $Gait Training: 8-22 mins   PT G Codes:   PT G-Codes **NOT FOR INPATIENT CLASS** Functional Assessment Tool Used: Clinical Judgement Functional Limitation: Mobility: Walking and moving around Mobility: Walking and Moving Around Current Status ([id number]): At least 40 percent but less than 60 percent impaired, limited or restricted Mobility: Walking and Moving Around Goal Status [phone]): At least 20 percent but less than 40 percent impaired, limited or restricted    Elray McgregorCynthia Vela Render 05/10/2016, 2:59 PM  Sheran Lawlessyndi Avonna Iribe, PT [phone] 05/10/2016

## 2016-05-10 NOTE — Progress Notes (Signed)
  Echocardiogram 2D Echocardiogram has been performed.  Cathie BeamsGREGORY, Payten Hobin 05/10/2016, 11:43 AM

## 2016-05-11 ENCOUNTER — Inpatient Hospital Stay (HOSPITAL_COMMUNITY): Payer: BC Managed Care – PPO

## 2016-05-11 ENCOUNTER — Other Ambulatory Visit: Payer: Self-pay | Admitting: Physician Assistant

## 2016-05-11 DIAGNOSIS — I63439 Cerebral infarction due to embolism of unspecified posterior cerebral artery: Secondary | ICD-10-CM

## 2016-05-11 DIAGNOSIS — M79606 Pain in leg, unspecified: Secondary | ICD-10-CM

## 2016-05-11 DIAGNOSIS — I639 Cerebral infarction, unspecified: Secondary | ICD-10-CM

## 2016-05-11 DIAGNOSIS — I63011 Cerebral infarction due to thrombosis of right vertebral artery: Secondary | ICD-10-CM

## 2016-05-11 LAB — COMPREHENSIVE METABOLIC PANEL
ALT: 17 U/L (ref 17–63)
AST: 20 U/L (ref 15–41)
Albumin: 3.2 g/dL — ABNORMAL LOW (ref 3.5–5.0)
Alkaline Phosphatase: 56 U/L (ref 38–126)
Anion gap: 13 (ref 5–15)
BUN: 11 mg/dL (ref 6–20)
CO2: 24 mmol/L (ref 22–32)
Calcium: 9.2 mg/dL (ref 8.9–10.3)
Chloride: 105 mmol/L (ref 101–111)
Creatinine, Ser: 0.84 mg/dL (ref 0.61–1.24)
GFR calc Af Amer: >60 mL/min (ref >60)
GFR calc non Af Amer: >60 mL/min (ref >60)
Glucose, Bld: 75 mg/dL (ref 65–99)
Potassium: 3.7 mmol/L (ref 3.5–5.1)
Sodium: 142 mmol/L (ref 135–145)
Total Bilirubin: 1.3 mg/dL — ABNORMAL HIGH (ref 0.3–1.2)
Total Protein: 6.4 g/dL — ABNORMAL LOW (ref 6.5–8.1)

## 2016-05-11 LAB — CBC WITH DIFFERENTIAL/PLATELET
Basophils Absolute: 0 10*3/uL (ref 0.0–0.1)
Basophils Relative: 0 %
Eosinophils Absolute: 0.1 10*3/uL (ref 0.0–0.7)
Eosinophils Relative: 1 %
HCT: 44.6 % (ref 39.0–52.0)
Hemoglobin: 15.5 g/dL (ref 13.0–17.0)
Lymphocytes Relative: 20 %
Lymphs Abs: 1.6 10*3/uL (ref 0.7–4.0)
MCH: 33.5 pg (ref 26.0–34.0)
MCHC: 34.8 g/dL (ref 30.0–36.0)
MCV: 96.5 fL (ref 78.0–100.0)
Monocytes Absolute: 1 10*3/uL (ref 0.1–1.0)
Monocytes Relative: 13 %
Neutro Abs: 5.3 10*3/uL (ref 1.7–7.7)
Neutrophils Relative %: 66 %
Platelets: 182 10*3/uL (ref 150–400)
RBC: 4.62 MIL/uL (ref 4.22–5.81)
RDW: 12.8 % (ref 11.5–15.5)
WBC: 8 10*3/uL (ref 4.0–10.5)

## 2016-05-11 NOTE — Progress Notes (Signed)
STROKE TEAM PROGRESS NOTE   SUBJECTIVE (INTERVAL HISTORY) His wife is at the bedside.  Pt much awake alert and responsive. Stated still dizzy and nausea with motion and standing. Wife stated b/l leg pain, pt more stated b/l knee pain. Will do LE venous doppler to rule out DVT given stroke.   OBJECTIVE Temp:  [98.4 F (36.9 C)-100 F (37.8 C)] 99.1 F (37.3 C) (05/12 1405) Pulse Rate:  [58-78] 78 (05/12 1405) Cardiac Rhythm:  [-] Sinus bradycardia (05/12 0700) Resp:  [17-18] 18 (05/12 1405) BP: (121-139)/(61-88) 132/88 mmHg (05/12 1405) SpO2:  [95 %-99 %] 97 % (05/12 1405)  CBC:   Recent Labs Lab 05/08/16 1750 05/09/16 0042 05/11/16 0347  WBC 7.0 9.8 8.0  NEUTROABS 3.7  --  5.3  HGB 15.7 13.8 15.5  HCT 44.8 40.5 44.6  MCV 94.9 95.3 96.5  PLT 212 211 182    Basic Metabolic Panel:   Recent Labs Lab 05/08/16 1750 05/09/16 0042 05/11/16 0347  NA 139  --  142  K 3.5  --  3.7  CL 110  --  105  CO2 22  --  24  GLUCOSE 139*  --  75  BUN 17  --  11  CREATININE 1.01 0.97 0.84  CALCIUM 8.9  --  9.2    Lipid Panel:     Component Value Date/Time   CHOL 165 05/09/2016 0042   TRIG 34 05/09/2016 0042   HDL 41 05/09/2016 0042   CHOLHDL 4.0 05/09/2016 0042   VLDL 7 05/09/2016 0042   LDLCALC 117* 05/09/2016 0042   HgbA1c:  Lab Results  Component Value Date   HGBA1C 5.3 05/09/2016   Urine Drug Screen:     Component Value Date/Time   LABOPIA NONE DETECTED 05/08/2016 2040   COCAINSCRNUR NONE DETECTED 05/08/2016 2040   LABBENZ NONE DETECTED 05/08/2016 2040   AMPHETMU NONE DETECTED 05/08/2016 2040   THCU NONE DETECTED 05/08/2016 2040   LABBARB NONE DETECTED 05/08/2016 2040      IMAGING  Ct Head Wo Contrast 05/08/2016   Mildly limited examination demonstrating mild atrophy with no acute abnormality.   Mr Brain Wo Contrast 05/10/2016   Large acute RIGHT cerebellar infarct, nonhemorrhagic, PICA territory. Much smaller infarct of the LEFT medial inferior cerebellum,  also PICA territory. Mild mass effect on the fourth ventricle. The patient should be observed for signs of developing obstructive hydrocephalus. A call is in to the care team.   CTA head ane neck - Acute PICA infarct right greater than left. There appears to be decreased flow in the right PICA which may be related to the acute infarct and partial occlusion. No filling defect or thrombus identified. No vertebral artery stenosis or dissection is identified as a cause of the infarct. No significant carotid or vertebral artery stenosis in the neck. Negative for dissection.  TTE - - Left ventricle: The cavity size was normal. Wall thickness was  increased increased in a pattern of mild to moderate LVH.  Systolic function was normal. The estimated ejection fraction was  in the range of 55% to 60%. Wall motion was normal; there were no  regional wall motion abnormalities. Doppler parameters are  consistent with abnormal left ventricular relaxation (grade 1  diastolic dysfunction). - Mitral valve: There was mild regurgitation. - Left atrium: The atrium was mildly dilated. Impressions: - No cardiac source of emboli was indentified.  LE venous doppler - Bilateral: No evidence of DVT, superficial thrombosis, or Baker's Cyst.   PHYSICAL  EXAM  Temp:  [98.4 F (36.9 C)-100 F (37.8 C)] 99.1 F (37.3 C) (05/12 1405) Pulse Rate:  [58-78] 78 (05/12 1405) Resp:  [17-18] 18 (05/12 1405) BP: (121-139)/(61-88) 132/88 mmHg (05/12 1405) SpO2:  [95 %-99 %] 97 % (05/12 1405)  General - Well nourished, well developed, in no apparent distress.  Ophthalmologic - Fundi not visualized due to noncooperation.  Cardiovascular - Regular rate and rhythm with no murmur.  Mental Status -  Level of arousal and orientation to time, place, and person were intact. Language including expression, naming, repetition, comprehension was assessed and found intact. Fund of Knowledge was assessed and was  intact.  Cranial Nerves II - XII - II - Visual field intact OU. III, IV, VI - Extraocular movements intact. V - Facial sensation intact bilaterally. VII - Facial movement intact bilaterally. VIII - Hearing & vestibular intact bilaterally, no nystagmus. X - Palate elevates symmetrically. XI - Chin turning & shoulder shrug intact bilaterally. XII - Tongue protrusion intact.  Motor Strength - The patient's strength was normal in all extremities and pronator drift was absent.  Bulk was normal and fasciculations were absent.   Motor Tone - Muscle tone was assessed at the neck and appendages and was normal.  Reflexes - The patient's reflexes were 1+ in all extremities and he had no pathological reflexes.  Sensory - Light touch, temperature/pinprick were assessed and were symmetrical.    Coordination - The patient had normal movements in the hands and legs with no ataxia or dysmetria. Tremor was absent.  Gait and Station - deferrerd due to safety concerns.   ASSESSMENT/PLAN Mr. Voncille LoJohnny Musich is a 65 y.o. male with no significant past medical history presenting with sudden onset vertigo and nausea. He did not receive IV t-PA.   Stroke:  bilateral PICA infarct, R>L, consistent with right PICA occlusion or high grade stenosis.  Resultant  Dizziness, nausea  MRI  bilateral PICA infarct, R>L  CTA head and neck possible right PICA occlusion vs. High grade stenosis, no VA dissection or stenosis   Repeat CT head in am  2D Echo  Unremarkable  LE venous doppler no DVT  LDL 117  HgbA1c 5.3  Lovenox 40 mg sq daily for VTE prophylaxis  Diet Heart Room service appropriate?: Yes; Fluid consistency:: Thin. On thin liquid diet given nausea.  No antithrombotic prior to admission, now on aspirin 325 mg daily.   Patient counseled to be compliant with his antithrombotic medications  Ongoing aggressive stroke risk factor management  Therapy recommendations:  CIR   Disposition:   pending  Hyperlipidemia  Home meds:  No statin  LDL 117, goal < 70  Now on lipitor 40 mg daily  Continue statin at discharge  Other Stroke Risk Factors  Advanced age  Hospital day # 3911  Marvel PlanJindong Peytyn Trine, MD PhD Stroke Neurology 05/11/2016 3:28 PM     To contact Stroke Continuity provider, please refer to [email or web address]. After hours, contact General Neurology

## 2016-05-11 NOTE — Progress Notes (Signed)
Physical Therapy Treatment Patient Details Name: Voncille LoJohnny Caulfield MRN: [id number] DOB: 02/14/1951 Today's Date: 05/11/2016    History of Present Illness Bethann BerkshireJohnny Montez MoritaCarter is an 65 y.o. male with no previously documented medical history abnormal medications who was brought to the emergency room and code stroke status following acute onset of vertigo and nausea as well as feeling of weakness involving upper extremities primarily.  MRI positive for Large acute R cerebellar infarct PICA terriotory and smaller L medial inferior cerebellar infarct also PICA terriotory.    PT Comments    Patient progressing with mobility and tolerance to activity.  But severely fatigued and weak after session needing to return to supine after session.  Educated to sit up in chair for meals with staff assist and to walk with staff assist over the weekend.  Follow Up Recommendations  CIR     Equipment Recommendations  Rolling walker with 5" wheels    Recommendations for Other Services       Precautions / Restrictions Precautions Precautions: Fall    Mobility  Bed Mobility Overal bed mobility: Needs Assistance       Supine to sit: Supervision Sit to supine: Supervision   General bed mobility comments: increased time, reports 5/10 dizziness in sitting  Transfers Overall transfer level: Needs assistance Equipment used: Rolling walker (2 wheeled) Transfers: Sit to/from Stand Sit to Stand: Min assist         General transfer comment: assist for balance, continued cues for gaze fixation to assist wtih balance/dizziness  Ambulation/Gait Ambulation/Gait assistance: Min assist Ambulation Distance (Feet): 150 Feet Assistive device: Rolling walker (2 wheeled) Gait Pattern/deviations: Step-through pattern;Decreased stride length;Wide base of support     General Gait Details: continued cues for compensation technique for diminishing vertigo symptoms; assist on turns with walker management   Stairs            Wheelchair Mobility    Modified Rankin (Stroke Patients Only) Modified Rankin (Stroke Patients Only) Pre-Morbid Rankin Score: No symptoms Modified Rankin: Moderately severe disability     Balance Overall balance assessment: Needs assistance   Sitting balance-Leahy Scale: Good       Standing balance-Leahy Scale: Fair Standing balance comment: practicing balance standing beside bed 30 sec trials no UE support x 3 with feet side by side with increased ant/post sway; then with one foot slightly forward then switched feet still wtih ant/post sway                    Cognition Arousal/Alertness: Awake/alert Behavior During Therapy: WFL for tasks assessed/performed Overall Cognitive Status: Within Functional Limits for tasks assessed                      Exercises      General Comments General comments (skin integrity, edema, etc.): no c/o double vision; dizziness after gait still 5-6/10      Pertinent Vitals/Pain Pain Assessment: No/denies pain    Home Living                      Prior Function            PT Goals (current goals can now be found in the care plan section) Progress towards PT goals: Progressing toward goals    Frequency  Min 4X/week    PT Plan Current plan remains appropriate    Co-evaluation             End of Session Equipment Utilized During  Treatment: Gait belt Activity Tolerance: Patient limited by fatigue Patient left: in bed;with call bell/phone within reach;with bed alarm set     Time: 1610-9604 PT Time Calculation (min) (ACUTE ONLY): 20 min  Charges:  $Gait Training: 8-22 mins                    G Codes:      Elray Mcgregor 05/17/2016, 5:06 PM  Sheran Lawless, PT [phone] 05-17-2016

## 2016-05-11 NOTE — NC FL2 (Signed)
[place] MEDICAID FL2 LEVEL OF CARE SCREENING TOOL     IDENTIFICATION  Patient Name: [name] Birthdate: Jun 25, 1951 Sex: male Admission Date (Current Location): 05/08/2016  [facility] and [place] Number:  Producer, television/film/video and Address:  [facility], [address], [place], [place] [address]      Provider Number: [id number]  Attending Physician Name and Address:  Rolly Salter, MD  Relative Name and Phone Number:  Darl Pikes, spouse, [phone]    Current Level of Care: Hospital Recommended Level of Care: Skilled Nursing Facility Prior Approval Number:    Date Approved/Denied:   PASRR Number: [id number]  Discharge Plan: SNF    Current Diagnoses: Patient Active Problem List   Diagnosis Date Noted  . Acute right PCA stroke (HCC) 05/10/2016  . Acute CVA (cerebrovascular accident) (HCC) 05/10/2016  . Emesis   . Vertigo 05/08/2016  . Intractable nausea and vomiting 05/08/2016    Orientation RESPIRATION BLADDER Height & Weight     Self, Time, Situation, Place  Normal Continent Weight: 90.719 kg (200 lb) Height:  6' (182.9 cm)  BEHAVIORAL SYMPTOMS/MOOD NEUROLOGICAL BOWEL NUTRITION STATUS   (N/A)   Continent Diet (Please see DC summary)  AMBULATORY STATUS COMMUNICATION OF NEEDS Skin   Limited Assist Verbally Normal                       Personal Care Assistance Level of Assistance  Bathing, Feeding, Dressing Bathing Assistance: Limited assistance Feeding assistance: Limited assistance Dressing Assistance: Limited assistance     Functional Limitations Info             SPECIAL CARE FACTORS FREQUENCY  PT (By licensed PT)     PT Frequency: min 4x/week              Contractures      Additional Factors Info  Code Status, Allergies Code Status Info: Full Allergies Info: NKA           Current Medications (05/11/2016):  This is the current hospital active medication list Current Facility-Administered Medications   Medication Dose Route Frequency Provider Last Rate Last Dose  . acetaminophen (TYLENOL) tablet 650 mg  650 mg Oral Q4H PRN Alberteen Sam, MD       Or  . acetaminophen (TYLENOL) suppository 650 mg  650 mg Rectal Q4H PRN Alberteen Sam, MD      . aspirin EC tablet 325 mg  325 mg Oral Daily Rolly Salter, MD   325 mg at 05/10/16 1801  . atorvastatin (LIPITOR) tablet 40 mg  40 mg Oral q1800 Rolly Salter, MD   40 mg at 05/10/16 1801  . diphenhydrAMINE (BENADRYL) injection 12.5 mg  12.5 mg Intravenous Q6H PRN Rolly Salter, MD      . enoxaparin (LOVENOX) injection 40 mg  40 mg Subcutaneous Q24H Alberteen Sam, MD   40 mg at 05/10/16 1801  . LORazepam (ATIVAN) injection 0.5 mg  0.5 mg Intravenous Q6H PRN Alberteen Sam, MD   0.5 mg at 05/10/16 0730  . meclizine (ANTIVERT) tablet 25 mg  25 mg Oral TID Rolly Salter, MD   25 mg at 05/10/16 2144  . ondansetron (ZOFRAN) injection 4 mg  4 mg Intravenous Q8H PRN Alberteen Sam, MD   4 mg at 05/09/16 0327   Or  . ondansetron (ZOFRAN) tablet 4 mg  4 mg Oral Q8H PRN Alberteen Sam, MD   4 mg  at 05/09/16 1112  . prochlorperazine (COMPAZINE) injection 10 mg  10 mg Intravenous Q6H PRN Rolly SalterPranav M Patel, MD      . senna-docusate (Senokot-S) tablet 1 tablet  1 tablet Oral QHS PRN Alberteen Samhristopher P Danford, MD         Discharge Medications: Please see discharge summary for a list of discharge medications.  Relevant Imaging Results:  Relevant Lab Results:   Additional Information SSN: [id number]  [address] Mauna Loa EstatesRayyan, [place]CSWA

## 2016-05-11 NOTE — Care Management Note (Signed)
Case Management Note  Patient Details  Name: [name] MRN: [id number] Date of Birth: 08/02/1951  Subjective/Objective:                    Action/Plan: PT rec is for CIR. CM continuing to follow for discharge needs.   Expected Discharge Date:                  Expected Discharge Plan:     In-House Referral:     Discharge planning Services     Post Acute Care Choice:    Choice offered to:     DME Arranged:    DME Agency:     HH Arranged:    HH Agency:     Status of Service:  In process, will continue to follow  Medicare Important Message Given:    Date Medicare IM Given:    Medicare IM give by:    Date Additional Medicare IM Given:    Additional Medicare Important Message give by:     If discussed at Long Length of Stay Meetings, dates discussed:    Additional Comments:  Kermit BaloKelli F Aylana Hirschfeld, RN 05/11/2016, 11:42 AM

## 2016-05-11 NOTE — Progress Notes (Signed)
[facility] Progress Note  Patient: [name] Freundlich ZOX:[id number]   PCP: Mickie HillierLITTLE,KEVIN LORNE, MD DOB: 01/11/1951   DOA: 05/08/2016   DOS: 05/11/2016   Date of Service: the patient was seen and examined on 05/11/2016  Subjective: Patient mentions improvement in symptoms. No nausea no vomiting. Tolerating oral diet. Nutrition: Improving oral intake  Brief hospital course: Patient was admitted on 05/08/2016, with complaint of nausea and vertigo, was found to have likely posterior circulation stroke. PTOT and speech therapy consulted. Currently further plan is to further stroke workup.  Assessment and Plan: 1. Acute right PCA stroke (HCC)  Dyslipidemia Intractable nausea and vomiting  Initially felt to be peripheral vertigo. Matter of the brain this morning is positive for right PCA territory stroke. Aspirin added Lipitor added. CT angiogram head and neck added. Echocardiogram added. PTOT and speech therapy continues to follow the patient. LDL 117. Hemoglobin A1c normal Lower extended Doppler negative for DVT, echo cardiac shows normal EF. Allow permissive hypertension at present. Scheduled meclizine add Compazine and Benadryl as needed. Will need 30 day loop monitor as an outpatient due to bilateral stroke.  If the patient has any change in mental status during the course of hospitalization patient will need a repeat CT scan to rule out hydrocephalus. The patient has persistent headache she will need a repeat CT scan to rule out hydrocephalus. Regardless a CT scan repeated in tomorrow identify potential for further worsening  Pain management: When necessary Tylenol Activity: physical therapy recommends CIR Bowel regimen: last BM 05/18/2016 Diet: Cardiac diet DVT Prophylaxis: subcutaneous Heparin  Advance goals of care discussion: Full code  Family Communication: family was present at bedside, at the time of interview. The pt provided permission to discuss medical plan with the  family. Opportunity was given to ask question and all questions were answered satisfactorily.   Disposition:  Discharge to CIR Expected discharge date: 05/14/2016  Consultants: Neurology Procedures: Echocardiogram pending  Antibiotics: Anti-infectives    None        Intake/Output Summary (Last 24 hours) at 05/11/16 1848 Last data filed at 05/11/16 [id number]  Gross per 24 hour  Intake    240 ml  Output   1050 ml  Net   -810 ml   Filed Weights   05/08/16 1744  Weight: 90.719 kg (200 lb)    Objective: Physical Exam: Filed Vitals:   05/11/16 0545 05/11/16 1029 05/11/16 1405 05/11/16 1739  BP: 134/83 129/77 132/88 137/87  Pulse: 64 74 78 65  Temp: 98.8 F (37.1 C) 98.4 F (36.9 C) 99.1 F (37.3 C) 98.6 F (37 C)  TempSrc: Oral Oral Oral Oral  Resp: 18 18 18 18   Height:      Weight:      SpO2: 95% 96% 97% 98%    General: Alert, Awake and Oriented to Time, Place and Person. Appear in moderate distress Eyes: PERRL, Conjunctiva normal ENT: Oral Mucosa clear moist. Neck: no JVD, no Abnormal Mass Or lumps Cardiovascular: S1 and S2 Present, no Murmur, Peripheral Pulses Present Respiratory: Bilateral Air entry equal and Decreased,  Clear to Auscultation, no Crackles, no wheezes Abdomen: Bowel Sound present, Soft and no tenderness Skin: redness no, no Rash  Extremities: no Pedal edema, no calf tenderness Neurologic: Grossly no focal neuro deficit. Bilaterally Equal motor strength Generalized weakness  Data Reviewed: CBC:  Recent Labs Lab 05/08/16 1750 05/09/16 0042 05/11/16 0347  WBC 7.0 9.8 8.0  NEUTROABS 3.7  --  5.3  HGB 15.7 13.8 15.5  HCT 44.8  40.5 44.6  MCV 94.9 95.3 96.5  PLT 212 211 182   Basic Metabolic Panel:  Recent Labs Lab 05/08/16 1750 05/09/16 0042 05/11/16 0347  NA 139  --  142  K 3.5  --  3.7  CL 110  --  105  CO2 22  --  24  GLUCOSE 139*  --  75  BUN 17  --  11  CREATININE 1.01 0.97 0.84  CALCIUM 8.9  --  9.2    Liver Function  Tests:  Recent Labs Lab 05/08/16 1750 05/11/16 0347  AST 30 20  ALT 22 17  ALKPHOS 59 56  BILITOT 0.5 1.3*  PROT 7.2 6.4*  ALBUMIN 4.1 3.2*   No results for input(s): LIPASE, AMYLASE in the last 168 hours. No results for input(s): AMMONIA in the last 168 hours. Coagulation Profile:  Recent Labs Lab 05/08/16 1750  INR 1.01   Cardiac Enzymes:  Recent Labs Lab 05/08/16 1750  TROPONINI <0.03   BNP (last 3 results) No results for input(s): PROBNP in the last 8760 hours.  CBG:  Recent Labs Lab 05/08/16 1749 05/08/16 1909  GLUCAP 143* 120*    Studies: No results found.   Scheduled Meds: . aspirin EC  325 mg Oral Daily  . atorvastatin  40 mg Oral q1800  . enoxaparin (LOVENOX) injection  40 mg Subcutaneous Q24H  . meclizine  25 mg Oral TID   Continuous Infusions:   PRN Meds: acetaminophen **OR** acetaminophen, diphenhydrAMINE, LORazepam, ondansetron (ZOFRAN) IV **OR** ondansetron, prochlorperazine, senna-docusate  Time spent: 30 minutes  Author: Lynden Oxford, MD [facility] Pager: [phone] 05/11/2016 6:48 PM  If 7PM-7AM, please contact night-coverage at [email or web address], password [email or web address]

## 2016-05-11 NOTE — Progress Notes (Signed)
VASCULAR LAB PRELIMINARY  PRELIMINARY  PRELIMINARY  PRELIMINARY  Bilateral lower extremity venous duplex completed.    Preliminary report:  Bilateral:  No evidence of DVT, superficial thrombosis, or Baker's Cyst.   Larson Limones, RVS 05/11/2016, 1:51 PM

## 2016-05-12 LAB — COMPREHENSIVE METABOLIC PANEL
ALT: 32 U/L (ref 17–63)
AST: 28 U/L (ref 15–41)
Albumin: 3.4 g/dL — ABNORMAL LOW (ref 3.5–5.0)
Alkaline Phosphatase: 63 U/L (ref 38–126)
Anion gap: 12 (ref 5–15)
BUN: 13 mg/dL (ref 6–20)
CO2: 23 mmol/L (ref 22–32)
Calcium: 9.1 mg/dL (ref 8.9–10.3)
Chloride: 106 mmol/L (ref 101–111)
Creatinine, Ser: 0.94 mg/dL (ref 0.61–1.24)
GFR calc Af Amer: >60 mL/min (ref >60)
GFR calc non Af Amer: >60 mL/min (ref >60)
Glucose, Bld: 120 mg/dL — ABNORMAL HIGH (ref 65–99)
Potassium: 3.5 mmol/L (ref 3.5–5.1)
Sodium: 141 mmol/L (ref 135–145)
Total Bilirubin: 1.3 mg/dL — ABNORMAL HIGH (ref 0.3–1.2)
Total Protein: 6.5 g/dL (ref 6.5–8.1)

## 2016-05-12 LAB — CBC WITH DIFFERENTIAL/PLATELET
Basophils Absolute: 0 10*3/uL (ref 0.0–0.1)
Basophils Relative: 0 %
Eosinophils Absolute: 0.3 10*3/uL (ref 0.0–0.7)
Eosinophils Relative: 4 %
HCT: 49.7 % (ref 39.0–52.0)
Hemoglobin: 17.2 g/dL — ABNORMAL HIGH (ref 13.0–17.0)
Lymphocytes Relative: 19 %
Lymphs Abs: 1.3 10*3/uL (ref 0.7–4.0)
MCH: 32.5 pg (ref 26.0–34.0)
MCHC: 34.6 g/dL (ref 30.0–36.0)
MCV: 94 fL (ref 78.0–100.0)
Monocytes Absolute: 1 10*3/uL (ref 0.1–1.0)
Monocytes Relative: 14 %
Neutro Abs: 4.4 10*3/uL (ref 1.7–7.7)
Neutrophils Relative %: 63 %
Platelets: 195 10*3/uL (ref 150–400)
RBC: 5.29 MIL/uL (ref 4.22–5.81)
RDW: 12.7 % (ref 11.5–15.5)
WBC: 7 10*3/uL (ref 4.0–10.5)

## 2016-05-12 LAB — CK: Total CK: 193 U/L (ref 49–397)

## 2016-05-12 MED ORDER — MECLIZINE HCL 12.5 MG PO TABS
12.5000 mg | ORAL_TABLET | Freq: Three times a day (TID) | ORAL | Status: DC | PRN
  Administered 2016-05-12 – 2016-05-13 (×3): 12.5 mg via ORAL
  Filled 2016-05-12 (×3): qty 1

## 2016-05-12 MED ORDER — METOPROLOL TARTRATE 12.5 MG HALF TABLET
12.5000 mg | ORAL_TABLET | Freq: Two times a day (BID) | ORAL | Status: DC
  Administered 2016-05-12 – 2016-05-15 (×6): 12.5 mg via ORAL
  Filled 2016-05-12 (×6): qty 1

## 2016-05-12 MED ORDER — ZOLPIDEM TARTRATE 5 MG PO TABS
5.0000 mg | ORAL_TABLET | Freq: Every evening | ORAL | Status: DC | PRN
  Administered 2016-05-12: 5 mg via ORAL
  Filled 2016-05-12 (×2): qty 1

## 2016-05-12 NOTE — Progress Notes (Signed)
STROKE TEAM PROGRESS NOTE  HISTORY (from 05/08/2016) [name] is an 65 y.o. male with no previously documented medical history abnormal medications who was brought to the emergency room and code stroke status following acute onset of vertigo and nausea as well as feeling of weakness involving upper extremities primarily. No facial droop was noted. There was also no slurred speech. He has no previous history of stroke nor TIA and has not been on antiplatelet therapy. CT scan of his head showed no acute intracranial abnormality. Clinical exam was unremarkable except for nystagmus on right lateral gaze. MRI of the brain was attempted but could not be obtained because the patient had much worse nausea and abdominal discomfort on lying flat.   SUBJECTIVE (INTERVAL HISTORY) His wife is at the bedside.  Pt much awake alert and responsive. Stated still dizzy and nausea with motion and standing. Wife stated b/l leg pain, pt more stated b/l knee pain. Will do LE venous doppler to rule out DVT given stroke.   OBJECTIVE Temp:  [98 F (36.7 C)-99.1 F (37.3 C)] 98 F (36.7 C) (05/13 1055) Pulse Rate:  [65-81] 79 (05/13 1055) Cardiac Rhythm:  [-] Sinus bradycardia (05/13 0700) Resp:  [18-20] 18 (05/13 1055) BP: (111-137)/(64-88) 111/76 mmHg (05/13 1055) SpO2:  [95 %-98 %] 98 % (05/13 1055)  CBC:   Recent Labs Lab 05/11/16 0347 05/12/16 1035  WBC 8.0 7.0  NEUTROABS 5.3 4.4  HGB 15.5 17.2*  HCT 44.6 49.7  MCV 96.5 94.0  PLT 182 195    Basic Metabolic Panel:   Recent Labs Lab 05/08/16 1750 05/09/16 0042 05/11/16 0347  NA 139  --  142  K 3.5  --  3.7  CL 110  --  105  CO2 22  --  24  GLUCOSE 139*  --  75  BUN 17  --  11  CREATININE 1.01 0.97 0.84  CALCIUM 8.9  --  9.2    Lipid Panel:     Component Value Date/Time   CHOL 165 05/09/2016 0042   TRIG 34 05/09/2016 0042   HDL 41 05/09/2016 0042   CHOLHDL 4.0 05/09/2016 0042   VLDL 7 05/09/2016 0042   LDLCALC 117* 05/09/2016  0042   HgbA1c:  Lab Results  Component Value Date   HGBA1C 5.3 05/09/2016   Urine Drug Screen:     Component Value Date/Time   LABOPIA NONE DETECTED 05/08/2016 2040   COCAINSCRNUR NONE DETECTED 05/08/2016 2040   LABBENZ NONE DETECTED 05/08/2016 2040   AMPHETMU NONE DETECTED 05/08/2016 2040   THCU NONE DETECTED 05/08/2016 2040   LABBARB NONE DETECTED 05/08/2016 2040      IMAGING  Ct Head Wo Contrast 05/08/2016    Mildly limited examination demonstrating mild atrophy with no acute abnormality.    CT head without contrast 05/10/2016 Acute PICA infarct bilaterally right greater than left. Negative for hemorrhage.   Mr Brain Wo Contrast 05/10/2016    Large acute RIGHT cerebellar infarct, nonhemorrhagic, PICA territory.  Much smaller infarct of the LEFT medial inferior cerebellum, also PICA territory.  Mild mass effect on the fourth ventricle.  The patient should be observed for signs of developing obstructive hydrocephalus. A call is in to the care team.    CTA head ane neck  Acute PICA infarct right greater than left.  There appears to be decreased flow in the right PICA which may be related to the acute infarct and partial occlusion.  No filling defect or thrombus identified.  No vertebral  artery stenosis or dissection is identified as a cause of the infarct. No significant carotid or vertebral artery stenosis in the neck. Negative for dissection.  TTE  Left ventricle: The cavity size was normal. Wall thickness was  increased increased in a pattern of mild to moderate LVH.  Systolic function was normal. The estimated ejection fraction was  in the range of 55% to 60%. Wall motion was normal; there were no  regional wall motion abnormalities. Doppler parameters are  consistent with abnormal left ventricular relaxation (grade 1  diastolic dysfunction). - Mitral valve: There was mild regurgitation. - Left atrium: The atrium was mildly dilated. Impressions: - No  cardiac source of emboli was indentified.   LE venous doppler  Bilateral: No evidence of DVT, superficial thrombosis, or Baker's Cyst.    PHYSICAL EXAM  Temp:  [98 F (36.7 C)-99.1 F (37.3 C)] 98 F (36.7 C) (05/13 1055) Pulse Rate:  [65-81] 79 (05/13 1055) Resp:  [18-20] 18 (05/13 1055) BP: (111-137)/(64-88) 111/76 mmHg (05/13 1055) SpO2:  [95 %-98 %] 98 % (05/13 1055)  General - Well nourished, well developed, in no apparent distress.  Ophthalmologic - Fundi not visualized due to noncooperation.  Cardiovascular - Regular rate and rhythm with no murmur.  Mental Status -  Level of arousal and orientation to time, place, and person were intact. Language including expression, naming, repetition, comprehension was assessed and found intact. Fund of Knowledge was assessed and was intact.  Cranial Nerves II - XII - II - Visual field intact OU. III, IV, VI - Extraocular movements intact. V - Facial sensation intact bilaterally. VII - Facial movement intact bilaterally. VIII - Hearing & vestibular intact bilaterally, no nystagmus. X - Palate elevates symmetrically. XI - Chin turning & shoulder shrug intact bilaterally. XII - Tongue protrusion intact.  Motor Strength - The patient's strength was normal in all extremities and pronator drift was absent.  Bulk was normal and fasciculations were absent.   Motor Tone - Muscle tone was assessed at the neck and appendages and was normal.  Reflexes - The patient's reflexes were 1+ in all extremities and he had no pathological reflexes.  Sensory - Light touch, temperature/pinprick were assessed and were symmetrical.    Coordination - The patient had normal movements in the hands and legs with no ataxia or dysmetria. Tremor was absent.  Gait and Station - deferrerd due to safety concerns.   ASSESSMENT/PLAN Mr. [name] is a 65 y.o. male with no significant past medical history presenting with sudden onset vertigo and nausea.  He did not receive IV t-PA.   Stroke:  bilateral PICA infarct, R>L, consistent with right PICA occlusion or high grade stenosis.  Resultant  Dizziness, nausea  MRI  bilateral PICA infarct, R>L  CTA head and neck possible right PICA occlusion vs. High grade stenosis, no VA dissection or stenosis   Repeat CT head today - pending, if unremarkable and does not show any hydrocephalus the stroke team will sign off. Follow up w th Dr. Roda Shutters in 2 months.  2D Echo  Unremarkable  LE venous doppler no DVT  LDL 117  HgbA1c 5.3  Lovenox 40 mg sq daily for VTE prophylaxis  Diet Heart Room service appropriate?: Yes; Fluid consistency:: Thin. On thin liquid diet given nausea.  No antithrombotic prior to admission, now on aspirin 325 mg daily.   Patient counseled to be compliant with his antithrombotic medications  Ongoing aggressive stroke risk factor management  Therapy recommendations:  CIR  Disposition:  pending  Hyperlipidemia  Home meds:  No statin  LDL 117, goal < 70  Now on lipitor 40 mg daily  Continue statin at discharge  Other Stroke Risk Factors  Advanced age  Hospital day # 2   Personally examined patient and images, and have participated in and made any corrections needed to history, physical, neuro exam,assessment and plan as stated above.  I have personally obtained the history, evaluated lab date, reviewed imaging studies and agree with radiology interpretations. Stroke will sign off at this time.    Naomie DeanAntonia Tanganyika Bowlds, MD Stroke Neurology [phone] [facility]      To contact Stroke Continuity provider, please refer to [email or web address]. After hours, contact General Neurology

## 2016-05-12 NOTE — Progress Notes (Signed)
[facility] Progress Note  Patient: [name] Zumbro ZOX:[id number]   PCP: Mickie HillierLITTLE,KEVIN LORNE, MD DOB: 11/18/1951   DOA: 05/08/2016   DOS: 05/12/2016   Date of Service: the patient was seen and examined on 05/12/2016  Subjective: Patient mentions he had a rough night and was not able to sleep. Denies any headache but complains of fatigue and tiredness. Nausea is also improving.  Nutrition: Improving oral intake  Brief hospital course: Patient was admitted on 05/08/2016, with complaint of nausea and vertigo, was found to have likely posterior circulation stroke. PTOT and speech therapy consulted. Currently further plan is to arrange for CIR  Assessment and Plan: 1. Acute right PCA stroke (HCC)  Dyslipidemia Intractable nausea and vomiting  Initially felt to be peripheral vertigo. MRI of the brain is positive for right PCA territory stroke. Aspirin added Lipitor added. CT angiogram head and neck shows no acute blockage. Echocardiogram shows normal EF with diastolic dysfunction. PTOT and speech therapy recommends CIR LDL 117. Lipitor added Hemoglobin A1c normal Lower extended Doppler negative for DVT, Allow permissive hypertension at present. Will need 30 day loop monitor as an outpatient due to bilateral stroke.  Repeat CT scan prolonged on 05/12/2016  2. Generalized fatigue. Likely due to medications and insomnia We will discontinue all psychotropic medications.  3. Insomnia. Add Ambien.  Pain management: When necessary Tylenol Activity: physical therapy recommends CIR Bowel regimen: last BM 05/12/2016 Diet: Cardiac diet DVT Prophylaxis: subcutaneous Heparin  Advance goals of care discussion: Full code  Family Communication: family was present at bedside, at the time of interview. The pt provided permission to discuss medical plan with the family. Opportunity was given to ask question and all questions were answered satisfactorily.   Disposition:  Discharge to  CIR Expected discharge date: 05/14/2016  Consultants: Neurology Procedures: Echocardiogram, carotid Doppler, lower extremity DVT Doppler  Antibiotics: Anti-infectives    None      Intake/Output Summary (Last 24 hours) at 05/12/16 2013 Last data filed at 05/12/16 0720  Gross per 24 hour  Intake      0 ml  Output    500 ml  Net   -500 ml   Filed Weights   05/08/16 1744  Weight: 90.719 kg (200 lb)   Objective: Physical Exam: Filed Vitals:   05/12/16 0215 05/12/16 0522 05/12/16 1055 05/12/16 1430  BP: 112/64 112/77 111/76 117/79  Pulse: 66 73 79 70  Temp: 99 F (37.2 C) 98.2 F (36.8 C) 98 F (36.7 C)   TempSrc: Oral Oral Oral Oral  Resp: 18 18 18 18   Height:      Weight:      SpO2: 95% 98% 98% 98%   General: Alert, Awake and Oriented to Time, Place and Person. Appear in moderate distress Eyes: PERRL, Conjunctiva normal ENT: Oral Mucosa clear moist. Neck: no JVD, no Abnormal Mass Or lumps Cardiovascular: S1 and S2 Present, no Murmur, Peripheral Pulses Present Respiratory: Bilateral Air entry equal and Decreased,  Clear to Auscultation, no Crackles, no wheezes Abdomen: Bowel Sound present, Soft and no tenderness Skin: redness no, no Rash  Extremities: no Pedal edema, no calf tenderness Neurologic: Grossly no focal neuro deficit. Bilaterally Equal motor strength Generalized weakness  Data Reviewed: CBC:  Recent Labs Lab 05/08/16 1750 05/09/16 0042 05/11/16 0347 05/12/16 1035  WBC 7.0 9.8 8.0 7.0  NEUTROABS 3.7  --  5.3 4.4  HGB 15.7 13.8 15.5 17.2*  HCT 44.8 40.5 44.6 49.7  MCV 94.9 95.3 96.5 94.0  PLT 212 211  182 195   Basic Metabolic Panel:  Recent Labs Lab 05/08/16 1750 05/09/16 0042 05/11/16 0347 05/12/16 1035  NA 139  --  142 141  K 3.5  --  3.7 3.5  CL 110  --  105 106  CO2 22  --  24 23  GLUCOSE 139*  --  75 120*  BUN 17  --  11 13  CREATININE 1.01 0.97 0.84 0.94  CALCIUM 8.9  --  9.2 9.1    Liver Function Tests:  Recent  Labs Lab 05/08/16 1750 05/11/16 0347 05/12/16 1035  AST ALT 22 17 32  ALKPHOS 59 56 63  BILITOT 0.5 1.3* 1.3*  PROT 7.2 6.4* 6.5  ALBUMIN 4.1 3.2* 3.4*   No results for input(s): LIPASE, AMYLASE in the last 168 hours. No results for input(s): AMMONIA in the last 168 hours. Coagulation Profile:  Recent Labs Lab 05/08/16 1750  INR 1.01   Cardiac Enzymes:  Recent Labs Lab 05/08/16 1750 05/12/16 1035  CKTOTAL  --  193  TROPONINI <0.03  --    BNP (last 3 results) No results for input(s): PROBNP in the last 8760 hours.  CBG:  Recent Labs Lab 05/08/16 1749 05/08/16 1909  GLUCAP 143* 120*    Studies: No results found.   Scheduled Meds: . aspirin EC  325 mg Oral Daily  . atorvastatin  40 mg Oral q1800  . enoxaparin (LOVENOX) injection  40 mg Subcutaneous Q24H  . metoprolol tartrate  12.5 mg Oral BID   Continuous Infusions:   PRN Meds: acetaminophen **OR** acetaminophen, diphenhydrAMINE, meclizine, ondansetron (ZOFRAN) IV **OR** ondansetron, senna-docusate, zolpidem  Time spent: 30 minutes  Author: Lynden Oxford, MD [facility] Pager: [phone] 05/12/2016 8:13 PM  If 7PM-7AM, please contact night-coverage at [email or web address], password [facility]

## 2016-05-13 ENCOUNTER — Inpatient Hospital Stay (HOSPITAL_COMMUNITY): Payer: BC Managed Care – PPO

## 2016-05-13 NOTE — Progress Notes (Signed)
Got a call from CCMD that pt had 7 beats of Vtach at 1951, pt denies any discomfort, calm in bed visiting with wife, NP Cloyde ReamsKatherine Schoor (on call) paged to be notified, no new orders at this time, will however continue to monitor. [name], [name]

## 2016-05-13 NOTE — Progress Notes (Signed)
[facility] Progress Note  Patient: [name] ZOX:[id number]   PCP: Mickie HillierLITTLE,KEVIN LORNE, MD DOB: 03/23/1951   DOA: 05/08/2016   DOS: 05/13/2016   Date of Service: the patient was seen and examined on 05/13/2016  Subjective: Patient appears a significant dizziness and had a near syncopal event overnight. Denies any dizziness the time of my evaluation. Continues to complain of fatigue. Nutrition: Improving oral intake  Brief hospital course: Patient was admitted on 05/08/2016, with complaint of nausea and vertigo, was found to have likely posterior circulation stroke. PTOT and speech therapy consulted. Currently further plan is to arrange for CIR  Assessment and Plan: 1. Acute right PCA stroke (HCC)  Dyslipidemia Intractable nausea and vomiting  Initially felt to be peripheral vertigo. MRI of the brain is positive for right PCA territory stroke. Aspirin added Lipitor added. CT angiogram head and neck shows no acute blockage. Echocardiogram shows normal EF with diastolic dysfunction. PTOT and speech therapy recommends CIR Repeat CT scan on 05/12/2016 does not show any evidence of hydrocephalus and shows evolving stroke only Hemoglobin A1c normal Lower extended Doppler negative for DVT, LDL 117. Lipitor added  Allow permissive hypertension at present. Will need 30 day loop monitor as an outpatient due to bilateral stroke.  2. Generalized fatigue. Likely due to medications and insomnia We will discontinue all psychotropic medications.  3. Insomnia. Add Ambien.  Pain management: When necessary Tylenol Activity: physical therapy recommends CIR Bowel regimen: last BM 05/12/2016 Diet: Cardiac diet DVT Prophylaxis: subcutaneous Heparin  Advance goals of care discussion: Full code  Family Communication: family was present at bedside, at the time of interview. The pt provided permission to discuss medical plan with the family. Opportunity was given to ask question and all  questions were answered satisfactorily.   Disposition:  Discharge to CIR Expected discharge date: 05/14/2016  Consultants: Neurology Procedures: Echocardiogram, carotid Doppler, lower extremity DVT Doppler  Antibiotics: Anti-infectives    None      Intake/Output Summary (Last 24 hours) at 05/13/16 1435 Last data filed at 05/13/16 0500  Gross per 24 hour  Intake      0 ml  Output    250 ml  Net   -250 ml   Filed Weights   05/08/16 1744  Weight: 90.719 kg (200 lb)   Objective: Physical Exam: Filed Vitals:   05/13/16 0211 05/13/16 0537 05/13/16 0903 05/13/16 1016  BP: 101/67 106/66 131/75 119/80  Pulse: 78 65 72 87  Temp: 98.6 F (37 C) 98.8 F (37.1 C)    TempSrc: Oral Oral Oral Oral  Resp: 18 18 18 18   Height:      Weight:      SpO2: 95% 95% 97% 98%   General: Alert, Awake and Oriented to Time, Place and Person. Appear in moderate distress Eyes: PERRL, Conjunctiva normal ENT: Oral Mucosa clear moist. Neck: no JVD, no Abnormal Mass Or lumps Cardiovascular: S1 and S2 Present, no Murmur, Peripheral Pulses Present Respiratory: Bilateral Air entry equal and Decreased,  Clear to Auscultation, no Crackles, no wheezes Abdomen: Bowel Sound present, Soft and no tenderness Skin: redness no, no Rash  Extremities: no Pedal edema, no calf tenderness Neurologic: Grossly no focal neuro deficit. Bilaterally Equal motor strength Generalized weakness  Data Reviewed: CBC:  Recent Labs Lab 05/08/16 1750 05/09/16 0042 05/11/16 0347 05/12/16 1035  WBC 7.0 9.8 8.0 7.0  NEUTROABS 3.7  --  5.3 4.4  HGB 15.7 13.8 15.5 17.2*  HCT 44.8 40.5 44.6 49.7  MCV 94.9 95.3  96.5 94.0  PLT 212 211 182 195   Basic Metabolic Panel:  Recent Labs Lab 05/08/16 1750 05/09/16 0042 05/11/16 0347 05/12/16 1035  NA 139  --  142 141  K 3.5  --  3.7 3.5  CL 110  --  105 106  CO2 22  --  24 23  GLUCOSE 139*  --  75 120*  BUN 17  --  11 13  CREATININE 1.01 0.97 0.84 0.94  CALCIUM 8.9   --  9.2 9.1    Liver Function Tests:  Recent Labs Lab 05/08/16 1750 05/11/16 0347 05/12/16 1035  AST ALT 22 17 32  ALKPHOS 59 56 63  BILITOT 0.5 1.3* 1.3*  PROT 7.2 6.4* 6.5  ALBUMIN 4.1 3.2* 3.4*   No results for input(s): LIPASE, AMYLASE in the last 168 hours. No results for input(s): AMMONIA in the last 168 hours. Coagulation Profile:  Recent Labs Lab 05/08/16 1750  INR 1.01   Cardiac Enzymes:  Recent Labs Lab 05/08/16 1750 05/12/16 1035  CKTOTAL  --  193  TROPONINI <0.03  --    BNP (last 3 results) No results for input(s): PROBNP in the last 8760 hours.  CBG:  Recent Labs Lab 05/08/16 1749 05/08/16 1909  GLUCAP 143* 120*    Studies: Ct Head Wo Contrast  05/13/2016  CLINICAL DATA:  Follow-up infarct. EXAM: CT HEAD WITHOUT CONTRAST TECHNIQUE: Contiguous axial images were obtained from the base of the skull through the vertex without intravenous contrast. COMPARISON:  CT HEAD May 10, 2016 and MRI brain May 10, 2016. FINDINGS: INTRACRANIAL CONTENTS: Stable wedge-like hypodensities in RIGHT greater than LEFT inferior cerebellum without further propagation or hemorrhagic conversion. The ventricles and sulci are normal for age, patent fourth ventricle. No intraparenchymal hemorrhage, mass effect nor midline shift. Minimal white matter changes compatible with chronic small vessel ischemic disease, better characterized on recent MRI brain. No abnormal extra-axial fluid collections. Basal cisterns are patent. Moderate calcific atherosclerosis of the carotid siphons. ORBITS: The included ocular globes and orbital contents are non-suspicious. SINUSES: The mastoid aircells and included paranasal sinuses are well-aerated. SKULL/SOFT TISSUES: No skull fracture. No significant soft tissue swelling. IMPRESSION: Evolving RIGHT greater than LEFT posterior-inferior cerebellar artery territory infarcts without propagation or hemorrhagic conversion. Electronically Signed    By: Awilda Metro M.D.   On: 05/13/2016 00:25     Scheduled Meds: . aspirin EC  325 mg Oral Daily  . atorvastatin  40 mg Oral q1800  . enoxaparin (LOVENOX) injection  40 mg Subcutaneous Q24H  . metoprolol tartrate  12.5 mg Oral BID   Continuous Infusions:   PRN Meds: acetaminophen **OR** acetaminophen, meclizine, ondansetron (ZOFRAN) IV **OR** ondansetron, senna-docusate, zolpidem  Time spent: 30 minutes  Author: Lynden Oxford, MD [facility] Pager: [phone] 05/13/2016 2:35 PM  If 7PM-7AM, please contact night-coverage at [email or web address], password [facility]

## 2016-05-13 NOTE — Progress Notes (Signed)
Pt ambulates with rolling walker stand by assist. He complaint of dizziness administered prn per Md order effective. Pt noted up in a chair at this time. No noted distress. Denies pain or discomfort. Call bell within reach. Wife at bedside. Will continue to monitor.

## 2016-05-14 DIAGNOSIS — G4700 Insomnia, unspecified: Secondary | ICD-10-CM

## 2016-05-14 MED ORDER — DIPHENHYDRAMINE HCL 25 MG PO CAPS
25.0000 mg | ORAL_CAPSULE | Freq: Every evening | ORAL | Status: DC | PRN
  Administered 2016-05-14: 25 mg via ORAL
  Filled 2016-05-14: qty 1

## 2016-05-14 NOTE — Progress Notes (Addendum)
Physical Therapy Treatment Patient Details Name: [name] MRN: [id number] DOB: 07/23/1951 Today's Date: 05/14/2016    History of Present Illness [name] is an 65 y.o. male with no previously documented medical history abnormal medications who was brought to the emergency room and code stroke status following acute onset of vertigo and nausea as well as feeling of weakness involving upper extremities primarily.  MRI positive for Large acute R cerebellar infarct PICA terriotory and smaller L medial inferior cerebellar infarct also PICA terriotory.    PT Comments    Pt progressing towards physical therapy goals. Mobility improving overall with compensation techniques for dizziness, however min assist still required for balance activity standing edge of chair. Pt appears to have poor safety awareness at times, and does not appear to understand his high risk for falls. Pt and wife educated on general safety awareness and need for assist with OOB mobility at this time. Will continue to follow.   Follow Up Recommendations  CIR     Equipment Recommendations  Rolling walker with 5" wheels    Recommendations for Other Services Rehab consult;OT consult     Precautions / Restrictions Precautions Precautions: Fall Restrictions Weight Bearing Restrictions: No    Mobility  Bed Mobility               General bed mobility comments: Pt sitting up in recliner upon PT arrival.   Transfers Overall transfer level: Needs assistance Equipment used: Rolling walker (2 wheeled) Transfers: Sit to/from Stand Sit to Stand: Min guard         General transfer comment: Close guard for safety as pt powered-up to full standing position. No gross losses of balance noted.   Ambulation/Gait Ambulation/Gait assistance: Min guard  Ambulation Distance (Feet): 300 Feet Assistive device: Rolling walker (2 wheeled) Gait Pattern/deviations: Step-through pattern;Decreased stride length;Trunk  flexed Gait velocity: Decreased Gait velocity interpretation: Below normal speed for age/gender General Gait Details: Cues for focal point during ambulation with head turns added in towards end of distance. Pt reports increased dizziness with head turns, even with eyes fixated.    Stairs            Wheelchair Mobility    Modified Rankin (Stroke Patients Only) Modified Rankin (Stroke Patients Only) Pre-Morbid Rankin Score: No symptoms Modified Rankin: Moderately severe disability     Balance Overall balance assessment: Needs assistance Sitting-balance support: Feet supported;No upper extremity supported Sitting balance-Leahy Scale: Good     Standing balance support: Bilateral upper extremity supported Standing balance-Leahy Scale: Fair Standing balance comment: UE support for balance                    Cognition Arousal/Alertness: Awake/alert Behavior During Therapy: WFL for tasks assessed/performed Overall Cognitive Status: Within Functional Limits for tasks assessed                      Exercises Other Exercises Other Exercises: Standing balance activity at edge of chair including ant/post weight shifts, anterior reaching activity, and single leg stance.     General Comments        Pertinent Vitals/Pain Pain Assessment: No/denies pain    Home Living                      Prior Function            PT Goals (current goals can now be found in the care plan section) Acute Rehab PT Goals Patient Stated Goal:  To help dizziness PT Goal Formulation: With patient Time For Goal Achievement: 05/17/16 Potential to Achieve Goals: Good Progress towards PT goals: Progressing toward goals    Frequency  Min 4X/week    PT Plan Current plan remains appropriate    Co-evaluation             End of Session Equipment Utilized During Treatment: Gait belt Activity Tolerance: Patient limited by fatigue Patient left: in chair;with call  bell/phone within reach;with family/visitor present     Time: 9562-1308 PT Time Calculation (min) (ACUTE ONLY): 21 min  Charges:  $Gait Training: 8-22 mins                    G Codes:      Conni Slipper 05-17-16, 2:43 PM   Conni Slipper, PT, DPT Acute Rehabilitation Services Pager: [phone]

## 2016-05-14 NOTE — Progress Notes (Signed)
Speech Language Pathology Treatment: Dysphagia  Patient Details Name: [name] MRN: [id number] DOB: 03/15/1951 Today's Date: 05/14/2016 Time: [id number] SLP Time Calculation (min) (ACUTE ONLY): 16 min  Assessment / Plan / Recommendation Clinical Impression  Pt seen to assess tolerance of po diet.  Pt admits largest deficit was nausea - that has essentially resolved.  Pt observed consuming banana and water - no indications of airway compromise nor dysphagia.    Spouse and pt deny pt having changes with speech/language.  Advised spouse/pt to monitor and assure that he does not notice organization/memory changes when returning home to normal environment given pt works driving a school bus.     HPI HPI: 65 y.o. male with no significant past medical history who presents with acute onset vertigo. MRI pending.  Pt failed RNSSS and evaluation ordered.  Nausea has been large issue but has improved and pt reports his swallowing to be baseline.      SLP Plan    DC SLP, dysphagia has resolved     Recommendations  Diet recommendations: Regular;Thin liquid Liquids provided via: Cup;Straw Medication Administration: Whole meds with liquid Supervision: Patient able to self feed Postural Changes and/or Swallow Maneuvers: Seated upright 90 degrees;Upright 30-60 min after meal             Follow up Recommendations: None     GO              Tony Burnetamara Valdemar Mcclenahan, MS [facility] SLP [phone]

## 2016-05-14 NOTE — Clinical Social Work Note (Addendum)
Clinical Social Work Assessment  Patient Details  Name: [name] MRN: [id number] Date of Birth: 1951/04/19  Date of referral:  05/11/16               Reason for consult:  Facility Placement                Permission sought to share information with:  Facility Sport and exercise psychologist, Family Supports Permission granted to share information::  Yes, Verbal Permission Granted  Name::     Manuela Schwartz  Agency::  [facility] SNFs  Relationship::  Spouse  Contact Information:  [phone]  Housing/Transportation Living arrangements for the past 2 months:  [place] of Information:  Patient Patient Interpreter Needed:  None Criminal Activity/Legal Involvement Pertinent to Current Situation/Hospitalization:  No - Comment as needed Significant Relationships:  Adult Children, Spouse Lives with:  Spouse Do you feel safe going back to the place where you live?  No Need for family participation in patient care:  Yes (Comment)  Care giving concerns:  CSW received referral for possible SNF placement at time of discharge. CSW met with patient regarding PT recommendation of SNF placement at time of discharge if CIR is unable to admit. Patient reported that patient's wife is currently unable to care for patient at their home given patient's current physical needs and fall risk. Patient expressed understanding of PT recommendation and is agreeable to SNF placement at time of discharge if CIR is unable to admit. CSW to continue to follow and assist with discharge planning needs.   Social Worker assessment / plan:  CSW spoke with patient concerning possibility of rehab at [facility] before returning home if CIR is unable to admit.  Employment status:  Other (Comment) Insurance information:  Managed Care PT Recommendations:  [place], Inpatient Rehab Consult Information / Referral to community resources:  [place]  Patient/Family's Response to care:  Patient  recognizes need for rehab before returning home and is agreeable to a SNF in [place]. He reports being hopeful that CIR can take him.  Patient/Family's Understanding of and Emotional Response to Diagnosis, Current Treatment, and Prognosis:  Patient is realistic regarding therapy needs. No questions/concerns about plan or treatment.    Emotional Assessment Appearance:  Appears stated age Attitude/Demeanor/Rapport:  Other (Appropriate) Affect (typically observed):  Accepting, Appropriate Orientation:  Oriented to Self, Oriented to Place, Oriented to  Time, Oriented to Situation Alcohol / Substance use:  Not Applicable Psych involvement (Current and /or in the community):  No (Comment)  Discharge Needs  Concerns to be addressed:  Care Coordination Readmission within the last 30 days:  No Current discharge risk:  None Barriers to Discharge:  Continued Medical Work up   [employer], [place] 05/14/2016, 9:32 AM

## 2016-05-14 NOTE — Progress Notes (Signed)
[facility] PROGRESS NOTE  Patient: [name] WUJ:[id number]   PCP: Mickie HillierLITTLE,KEVIN LORNE, MD DOB: 08/24/1951   DOA: 05/08/2016   DOS: 05/14/2016    Subjective: Patient continues to complain of fatigue the nausea no vomiting. Also wonders of dizziness. No chest pain abdominal pain.  Objective:  Physical Exam: Filed Vitals:   05/14/16 0501 05/14/16 0901 05/14/16 1400 05/14/16 1736  BP: 112/64 117/74 117/76 118/83  Pulse: 60 87 70 87  Temp: 98.3 F (36.8 C)  98.2 F (36.8 C) 97.8 F (36.6 C)  TempSrc: Oral  Oral Oral  Resp: 18  20 20   Height:      Weight:      SpO2: 95%  98% 98%   General: Alert, Awake and Oriented to Time, Place and Person. Appear in mild distress Eyes: PERRL, Conjunctiva normal ENT: Oral Mucosa clear moist. Cardiovascular: S1 and S2 Present, no Murmur, Respiratory: Bilateral Air entry equal and Decreased, Clear to Auscultation,  Abdomen: Bowel Sound present, Soft and no tenderness Extremities: no Pedal edema,  Neurologic: Grossly no focal neuro deficit. Bilaterally Equal motor strength  Assessment and plan: Acute right PCA stroke. Dyslipidemia. Continue 325 mg aspirin and Lipitor 40 mg. Meclizine 12.5 mg as needed.  NSVT. Lopressor 25 mg twice a day prednisone currently resolved.  Insomnia. Ambien has caused nightmares. I will change to Benadryl  Author: Lynden OxfordPranav Alya Smaltz, MD [facility] Pager: [phone] 05/14/2016 7:47 PM   If 7PM-7AM, please contact night-coverage at [email or web address], password [email or web address]

## 2016-05-14 NOTE — Progress Notes (Signed)
Inpatient Rehabilitation  I met with the patient and his wife Manuela Schwartz at the bedside to discuss the recommendation of IP Rehab.  I provided informational booklets and answered their questions.  Pt. And wife prefer IP Rehab.  I will initiate insurance authorization process with pt's BCBS.  Admission will be dependent on insurance authorization and bed availability.  Please call if questions.  [facility] Admissions Coordinator Cell [phone] Office [phone]

## 2016-05-14 NOTE — Care Management Note (Signed)
Case Management Note  Patient Details  Name: Voncille LoJohnny Giusto MRN: [id number] Date of Birth: 02/24/1951  Subjective/Objective:                    Action/Plan: PT/OT recs if for CIR. CM continuing to follow for d/c needs.   Expected Discharge Date:                  Expected Discharge Plan:     In-House Referral:     Discharge planning Services     Post Acute Care Choice:    Choice offered to:     DME Arranged:    DME Agency:     HH Arranged:    HH Agency:     Status of Service:  In process, will continue to follow  Medicare Important Message Given:    Date Medicare IM Given:    Medicare IM give by:    Date Additional Medicare IM Given:    Additional Medicare Important Message give by:     If discussed at Long Length of Stay Meetings, dates discussed:    Additional Comments:  Kermit BaloKelli F Sholom Dulude, RN 05/14/2016, 11:52 AM

## 2016-05-15 ENCOUNTER — Inpatient Hospital Stay (HOSPITAL_COMMUNITY)
Admission: RE | Admit: 2016-05-15 | Discharge: 2016-05-18 | DRG: 057 | Disposition: A | Discharge status: Home / Self Care | Payer: BC Managed Care – PPO | Source: Intra-hospital | Attending: Physical Medicine & Rehabilitation | Admitting: Physical Medicine & Rehabilitation

## 2016-05-15 ENCOUNTER — Encounter (HOSPITAL_COMMUNITY): Payer: Self-pay | Admitting: *Deleted

## 2016-05-15 ENCOUNTER — Telehealth: Payer: Self-pay | Admitting: Interventional Cardiology

## 2016-05-15 ENCOUNTER — Encounter (HOSPITAL_COMMUNITY): Payer: Self-pay | Admitting: Physical Medicine and Rehabilitation

## 2016-05-15 DIAGNOSIS — F419 Anxiety disorder, unspecified: Secondary | ICD-10-CM | POA: Diagnosis not present

## 2016-05-15 DIAGNOSIS — Z823 Family history of stroke: Secondary | ICD-10-CM

## 2016-05-15 DIAGNOSIS — I69393 Ataxia following cerebral infarction: Secondary | ICD-10-CM | POA: Diagnosis not present

## 2016-05-15 DIAGNOSIS — E785 Hyperlipidemia, unspecified: Secondary | ICD-10-CM | POA: Diagnosis not present

## 2016-05-15 DIAGNOSIS — R42 Dizziness and giddiness: Secondary | ICD-10-CM

## 2016-05-15 DIAGNOSIS — R110 Nausea: Secondary | ICD-10-CM | POA: Diagnosis present

## 2016-05-15 DIAGNOSIS — I10 Essential (primary) hypertension: Secondary | ICD-10-CM | POA: Diagnosis not present

## 2016-05-15 DIAGNOSIS — Z87891 Personal history of nicotine dependence: Secondary | ICD-10-CM | POA: Diagnosis not present

## 2016-05-15 DIAGNOSIS — R1110 Vomiting, unspecified: Secondary | ICD-10-CM

## 2016-05-15 DIAGNOSIS — I639 Cerebral infarction, unspecified: Secondary | ICD-10-CM | POA: Diagnosis present

## 2016-05-15 DIAGNOSIS — R112 Nausea with vomiting, unspecified: Secondary | ICD-10-CM

## 2016-05-15 DIAGNOSIS — I69398 Other sequelae of cerebral infarction: Secondary | ICD-10-CM | POA: Diagnosis not present

## 2016-05-15 LAB — CREATININE, SERUM
Creatinine, Ser: 0.9 mg/dL (ref 0.61–1.24)
GFR calc Af Amer: >60 mL/min (ref >60)
GFR calc non Af Amer: >60 mL/min (ref >60)

## 2016-05-15 MED ORDER — METOPROLOL TARTRATE 25 MG PO TABS: 12.5000 mg | ORAL_TABLET | Freq: Two times a day (BID) | ORAL | Status: DC

## 2016-05-15 MED ORDER — ACETAMINOPHEN 325 MG PO TABS: 325.0000 mg | ORAL_TABLET | ORAL | Status: DC | PRN

## 2016-05-15 MED ORDER — ASPIRIN EC 325 MG PO TBEC
325.0000 mg | DELAYED_RELEASE_TABLET | Freq: Every day | ORAL | Status: DC
  Administered 2016-05-16 – 2016-05-18 (×3): 325 mg via ORAL
  Filled 2016-05-15 (×3): qty 1

## 2016-05-15 MED ORDER — ATORVASTATIN CALCIUM 40 MG PO TABS
40.0000 mg | ORAL_TABLET | Freq: Every day | ORAL | Status: DC
  Administered 2016-05-15 – 2016-05-17 (×3): 40 mg via ORAL
  Filled 2016-05-15 (×3): qty 1

## 2016-05-15 MED ORDER — ENOXAPARIN SODIUM 40 MG/0.4ML ~~LOC~~ SOLN: 40.0000 mg | SUBCUTANEOUS | Status: DC

## 2016-05-15 MED ORDER — TRAZODONE HCL 50 MG PO TABS
25.0000 mg | ORAL_TABLET | Freq: Every evening | ORAL | Status: DC | PRN
Start: 2016-05-15 — End: 2016-05-18

## 2016-05-15 MED ORDER — FLEET ENEMA 7-19 GM/118ML RE ENEM: 1.0000 | ENEMA | Freq: Once | RECTAL | Status: DC | PRN

## 2016-05-15 MED ORDER — ALUM & MAG HYDROXIDE-SIMETH 200-200-20 MG/5ML PO SUSP
30.0000 mL | ORAL | Status: DC | PRN
Start: 2016-05-15 — End: 2016-05-18

## 2016-05-15 MED ORDER — ATORVASTATIN CALCIUM 40 MG PO TABS: 40.0000 mg | ORAL_TABLET | Freq: Every day | ORAL | Status: DC

## 2016-05-15 MED ORDER — ONDANSETRON HCL 4 MG PO TABS: 4.0000 mg | ORAL_TABLET | Freq: Three times a day (TID) | ORAL | Status: DC | PRN

## 2016-05-15 MED ORDER — BISACODYL 10 MG RE SUPP: 10.0000 mg | Freq: Every day | RECTAL | Status: DC | PRN

## 2016-05-15 MED ORDER — ASPIRIN 325 MG PO TBEC: 325.0000 mg | DELAYED_RELEASE_TABLET | Freq: Every day | ORAL | Status: DC

## 2016-05-15 MED ORDER — ENOXAPARIN SODIUM 40 MG/0.4ML ~~LOC~~ SOLN
40.0000 mg | SUBCUTANEOUS | Status: DC
  Administered 2016-05-16 – 2016-05-18 (×3): 40 mg via SUBCUTANEOUS
  Filled 2016-05-15 (×3): qty 0.4

## 2016-05-15 MED ORDER — SENNOSIDES-DOCUSATE SODIUM 8.6-50 MG PO TABS: 1.0000 | ORAL_TABLET | Freq: Every evening | ORAL | Status: DC | PRN

## 2016-05-15 MED ORDER — ONDANSETRON HCL 4 MG/2ML IJ SOLN
4.0000 mg | Freq: Three times a day (TID) | INTRAMUSCULAR | Status: DC | PRN
  Administered 2016-05-17: 4 mg via INTRAVENOUS
  Filled 2016-05-15: qty 2

## 2016-05-15 MED ORDER — METOPROLOL TARTRATE 12.5 MG HALF TABLET
12.5000 mg | ORAL_TABLET | Freq: Two times a day (BID) | ORAL | Status: DC
  Administered 2016-05-16 (×2): 12.5 mg via ORAL
  Filled 2016-05-15 (×6): qty 1

## 2016-05-15 MED ORDER — DIPHENHYDRAMINE HCL 25 MG PO CAPS: 25.0000 mg | ORAL_CAPSULE | Freq: Every evening | ORAL | Status: DC | PRN

## 2016-05-15 MED ORDER — BOOST / RESOURCE BREEZE PO LIQD
1.0000 | Freq: Three times a day (TID) | ORAL | Status: DC
  Administered 2016-05-17 – 2016-05-18 (×3): 1 via ORAL

## 2016-05-15 MED ORDER — MECLIZINE HCL 25 MG PO TABS: 12.5000 mg | ORAL_TABLET | Freq: Three times a day (TID) | ORAL | Status: DC | PRN

## 2016-05-15 MED ORDER — DIPHENHYDRAMINE HCL 25 MG PO CAPS
25.0000 mg | ORAL_CAPSULE | Freq: Every evening | ORAL | Status: DC | PRN
  Administered 2016-05-16: 25 mg via ORAL
  Filled 2016-05-15: qty 1

## 2016-05-15 MED ORDER — MECLIZINE HCL 12.5 MG PO TABS: 12.5000 mg | ORAL_TABLET | Freq: Three times a day (TID) | ORAL | Status: DC | PRN

## 2016-05-15 MED ORDER — GUAIFENESIN-DM 100-10 MG/5ML PO SYRP: 5.0000 mL | ORAL_SOLUTION | Freq: Four times a day (QID) | ORAL | Status: DC | PRN

## 2016-05-15 NOTE — Care Management Note (Signed)
Case Management Note  Patient Details  Name: [name] MRN: [id number] Date of Birth: 04/11/1951  Subjective/Objective:                    Action/Plan: Pt discharging to CIR. No further needs per CM.   Expected Discharge Date:                  Expected Discharge Plan:  IP Rehab Facility  In-House Referral:     Discharge planning Services     Post Acute Care Choice:    Choice offered to:     DME Arranged:    DME Agency:     HH Arranged:    HH Agency:     Status of Service:  Completed, signed off  Medicare Important Message Given:    Date Medicare IM Given:    Medicare IM give by:    Date Additional Medicare IM Given:    Additional Medicare Important Message give by:     If discussed at Long Length of Stay Meetings, dates discussed:    Additional Comments:  Tony BaloKelli F Shaquana Buel, RN 05/15/2016, 3:37 PM

## 2016-05-15 NOTE — Discharge Summary (Signed)
[facility] Discharge Summary   Patient: [name] ZOX:[id number]   PCP: Mickie Hillier, MD DOB: 15-Feb-1951   Date of admission: 05/08/2016   Date of discharge:  05/15/2016    Discharge Diagnoses:  Principal Problem:   Acute right PCA stroke [facility]) Active Problems:   Vertigo   Intractable nausea and vomiting   Emesis   Acute CVA (cerebrovascular accident) [facility])  Recommendations for Outpatient Follow-up:  1. Patient recommended to follow-up with PCP in one week, neurology in 2 months, ophthalmology in one month  2. Discharging to inpatient rehabilitation  Follow-up Information    Follow up with [facility] On 05/17/2016.   Specialty:  Cardiology   Why:  12:00PM. Outpatient 30 day event monitor.   Contact information:   [address] [place] [address] [phone]      Follow up with Lance Muss, MD On 06/28/2016.   Specialties:  Cardiology, Radiology, Interventional Cardiology   Why:  9:00AM. Cardiology visit after event monitor   Contact information:   [address] [place] [address] [phone]       Follow up with Xu,Jindong, MD. Schedule an appointment as soon as possible for a visit in 2 months.   Specialty:  Neurology   Contact information:   [address] [place] [address] [phone]       Follow up with Mickie Hillier, MD. Schedule an appointment as soon as possible for a visit in 1 week.   Specialty:  Family Medicine   Contact information:   [address] [place] [address] [phone]       Call in 1 week to follow up.   Why:  With ophthalmology for follow-up in 1 month     Diet recommendation: Cardiac diet  Activity: The patient is advised to gradually reintroduce usual activities.  Discharge Condition: good  History of present illness: As per the H and P dictated on admission, "[name] is a 65 y.o. 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 He stopped the bus, got out and threw up, and then someone called 9-1-1.   In the ED, he was afebrile, hemodynamically stable, and saturating well on room air. He complained of global weakness, but had no slurred speech, no focal weakness, no facial asymmetry. He was transferred to [facility] as a CODE STROKE, CT head was normal, and MR brain was attempted but aborted because the patient was repeatedly vomiting. Neurology were consulted and evaluated the patient, noting lateralizing nystagmus and no other focal deficits. He then was given several doses of diazepam and lorazepam, and TRH were asked to admit for observation and MRI in the morning.  He denies headache or neck pain. He has no history of vertigo. "  Hospital Course:  Summary of his active problems in the hospital is as following.  Acute right PCA stroke. Dyslipidemia. Continue 325 mg aspirin and Lipitor 40 mg. Meclizine 12.5 mg as needed.  Initially felt to be peripheral vertigo. MRI of the brain is positive for right PCA territory stroke. CT angiogram head and neck shows no acute blockage. Echocardiogram shows normal EF with diastolic dysfunction. PTOT and speech therapy recommends CIR Repeat CT scan on 05/12/2016 does not show any  evidence of hydrocephalus and shows evolving stroke only Hemoglobin A1c normal Lower extended Doppler negative for DVT, LDL 117. Lipitor added  Allow permissive hypertension at present. Will need 30 day loop monitor as an outpatient due to bilateral stroke.  NSVT. Lopressor 25 mg twice a day.  Insomnia. Ambien has caused nightmares. I will change to Benadryl  Right upper and lower, outer quadrant blurring of the vision Likely related to stroke, possibility of  primary ocular etiology cannot be ruled out. Patient also mentions he has chronic vision issues in last 6 months and has been having frequent eyeglasses changes. At present since the patient appears to be stable recommend patient to follow-up with ophthalmology in one month after discharge.   All other chronic medical condition were stable during the hospitalization.  Patient was seen by physical therapy, who recommended CIR, which was arranged by Child psychotherapist and case Production designer, theatre/television/film. On the day of the discharge the patient's vitals were stable, and no other acute medical condition were reported by patient. the patient was felt safe to be discharge at [facility] with therapy.  Procedures and Results:  Echocardiogram Study Conclusions  - Left ventricle: The cavity size was normal. Wall thickness was  increased increased in a pattern of mild to moderate LVH.  Systolic function was normal. The estimated ejection fraction was  in the range of 55% to 60%. Wall motion was normal; there were no  regional wall motion abnormalities. Doppler parameters are  consistent with abnormal left ventricular relaxation (grade 1  diastolic dysfunction). - Mitral valve: There was mild regurgitation. - Left atrium: The atrium was mildly dilated.  Impressions:  - No cardiac source of emboli was identified   Lower extremity Doppler  Summary:  - No evidence of deep vein or superficial thrombosis involving the  right lower extremity and left lower extremity. - No evidence of Baker&'s cyst on the right or left.  Consultations:  Neurology  DISCHARGE MEDICATION: Current Discharge Medication List    START taking these medications   Details  aspirin EC 325 MG EC tablet Take 1 tablet (325 mg total) by mouth daily. Qty: 30 tablet, Refills: 0    atorvastatin (LIPITOR) 40 MG tablet Take 1 tablet (40 mg total) by mouth daily at 6 PM. Qty: 30 tablet, Refills: 0    diphenhydrAMINE (BENADRYL) 25 mg capsule  Take 1 capsule (25 mg total) by mouth at bedtime as needed for sleep. Qty: 30 capsule, Refills: 0    meclizine (ANTIVERT) 12.5 MG tablet Take 1 tablet (12.5 mg total) by mouth 3 (three) times daily as needed for dizziness. Qty: 30 tablet, Refills: 0    metoprolol tartrate (LOPRESSOR) 25 MG tablet Take 0.5 tablets (12.5 mg total) by mouth 2 (two) times daily. Qty: 30 tablet, Refills: 0       No Known Allergies Discharge Instructions    Ambulatory referral to Neurology    Complete by:  As directed   Dr. Roda Shutters requests follow up for this patient in 2 months.     Diet - low sodium heart healthy    Complete by:  As directed      Discharge instructions    Complete by:  As directed   It is important that you read following instructions as well as go over your medication list with RN to help you understand your care after this hospitalization.  Discharge Instructions: Please follow-up with PCP in one week Follow-up with ophthalmology after discharge. Follow-up with neurology in 2 months after  discharge  Please request your primary care physician to go over all Hospital Tests and Procedure/Radiological results at the follow up,  Please get all Hospital records sent to your PCP by signing hospital release before you go home.   Do not drive, operating heavy machinery, perform activities at heights, swimming or participation in water activities or provide baby sitting services; until you have been seen by Primary Care Physician or a Neurologist and advised to do so again. Do not take more than prescribed Pain, Sleep and Anxiety Medications. You were cared for by a hospitalist during your hospital stay. If you have any questions about your discharge medications or the care you received while you were in the hospital after you are discharged, you can call the unit and ask to speak with the hospitalist on call if the hospitalist that took care of you is not available.  Once you are discharged, your  primary care physician will handle any further medical issues. Please note that NO REFILLS for any discharge medications will be authorized once you are discharged, as it is imperative that you return to your primary care physician (or establish a relationship with a primary care physician if you do not have one) for your aftercare needs so that they can reassess your need for medications and monitor your lab values. You Must read complete instructions/literature along with all the possible adverse reactions/side effects for all the Medicines you take and that have been prescribed to you. Take any new Medicines after you have completely understood and accept all the possible adverse reactions/side effects. Wear Seat belts while driving. If you have smoked or chewed Tobacco in the last 2 yrs please stop smoking and/or stop any Recreational drug use.     Driving Restrictions    Complete by:  As directed   Do not drive vehicle or operate heavy machinery until cleared by PCP or neurology     Increase activity slowly    Complete by:  As directed           Discharge Exam: Filed Weights   05/08/16 1744  Weight: 90.719 kg (200 lb)   Filed Vitals:   05/15/16 0649 05/15/16 0958  BP: 107/75 119/66  Pulse:  75  Temp:  97.9 F (36.6 C)  Resp:  20   General: Appear in mild distress, no Rash; Oral Mucosa moist. Cardiovascular: S1 and S2 Present, no Murmur, no JVD Respiratory: Bilateral Air entry present and Clear to Auscultation, n Crackles, ono wheezes Abdomen: Bowel Sound present, Soft and no tenderness Extremities: no Pedal edema, no calf tenderness Neurology: Occasional dizziness without any vertigo. Gait imbalance present. Right upper and lower outer quadrant peripheral vision blurry.  The results of significant diagnostics from this hospitalization (including imaging, microbiology, ancillary and laboratory) are listed below for reference.    Significant Diagnostic Studies: Ct Angio Head  W/cm &/or Wo Cm  05/10/2016  CLINICAL DATA:  Acute PICA stroke bilaterally right greater than left EXAM: CT ANGIOGRAPHY HEAD AND NECK TECHNIQUE: Multidetector CT imaging of the head and neck was performed using the standard protocol during bolus administration of intravenous contrast. Multiplanar CT image reconstructions and MIPs were obtained to evaluate the vascular anatomy. Carotid stenosis measurements (when applicable) are obtained utilizing NASCET criteria, using the distal internal carotid diameter as the denominator. CONTRAST:  50 mL Isovue 370 IV COMPARISON:  MRI 05/10/2016 FINDINGS: CTA NECK Aortic arch: Mild atherosclerotic aortic arch without aneurysm or stenosis. Aortic arch is uncoiled. Proximal great vessels  are tortuous but widely patent without stenosis. Lung apices clear Right carotid system: Right carotid artery widely patent and normal. Right carotid bifurcation normal. No stenosis or dissection Left carotid system: Left carotid widely patent. Negative for stenosis or dissection. No significant atherosclerotic disease Vertebral arteries:Both vertebral arteries widely patent to the basilar without stenosis. No dissection or stenosis. Skeleton: No acute abnormality Other neck: Multiple thyroid nodules. The largest nodule in the left upper pole is 13 mm. Thyroid ultrasound recommended. No adenopathy in the neck. CTA HEAD Anterior circulation: Cavernous carotid widely patent bilaterally without significant stenosis. Anterior and middle cerebral arteries widely patent bilaterally without stenosis. Posterior circulation: Both vertebral arteries patent to the basilar without stenosis. Left PICA patent. Right PICA small and may have decreased flow due to acute right PICA infarct. No thrombus identified. Basilar patent. Superior cerebellar and posterior cerebral arteries patent bilaterally. Fetal origin right posterior cerebral artery. Venous sinuses: Patent Anatomic variants: None Delayed phase: Normal  enhancement on delayed imaging. The infarct does not enhance. No mass lesion. IMPRESSION: Acute PICA infarct right greater than left. There appears to be decreased flow in the right PICA which may be related to the acute infarct and partial occlusion. No filling defect or thrombus identified. No vertebral artery stenosis or dissection is identified as a cause of the infarct. No significant carotid or vertebral artery stenosis in the neck. Negative for dissection. Electronically Signed   By: Marlan Palauharles  Clark M.D.   On: 05/10/2016 18:15   Ct Head Wo Contrast  05/13/2016  CLINICAL DATA:  Follow-up infarct. EXAM: CT HEAD WITHOUT CONTRAST TECHNIQUE: Contiguous axial images were obtained from the base of the skull through the vertex without intravenous contrast. COMPARISON:  CT HEAD May 10, 2016 and MRI brain May 10, 2016. FINDINGS: INTRACRANIAL CONTENTS: Stable wedge-like hypodensities in RIGHT greater than LEFT inferior cerebellum without further propagation or hemorrhagic conversion. The ventricles and sulci are normal for age, patent fourth ventricle. No intraparenchymal hemorrhage, mass effect nor midline shift. Minimal white matter changes compatible with chronic small vessel ischemic disease, better characterized on recent MRI brain. No abnormal extra-axial fluid collections. Basal cisterns are patent. Moderate calcific atherosclerosis of the carotid siphons. ORBITS: The included ocular globes and orbital contents are non-suspicious. SINUSES: The mastoid aircells and included paranasal sinuses are well-aerated. SKULL/SOFT TISSUES: No skull fracture. No significant soft tissue swelling. IMPRESSION: Evolving RIGHT greater than LEFT posterior-inferior cerebellar artery territory infarcts without propagation or hemorrhagic conversion. Electronically Signed   By: Awilda Metroourtnay  Bloomer M.D.   On: 05/13/2016 00:25   Ct Head Wo Contrast  05/10/2016  CLINICAL DATA:  Cerebellar stroke.  Dizziness and blurred vision EXAM: CT  HEAD WITHOUT CONTRAST TECHNIQUE: Contiguous axial images were obtained from the base of the skull through the vertex without intravenous contrast. COMPARISON:  MRI 05/10/2016 FINDINGS: Hypodensity in the right inferior cerebellum compatible with acute infarct. Smaller area of hypodensity in the left medial inferior cerebellum compatible with acute infarct. These areas show restricted diffusion on MRI. No associated hemorrhage Ventricle size normal. Negative for mass or edema. No shift of the midline structures. Calvarium intact. IMPRESSION: Acute PICA infarct bilaterally right greater than left. Negative for hemorrhage. Electronically Signed   By: Marlan Palauharles  Clark M.D.   On: 05/10/2016 17:35   Ct Head Wo Contrast  05/08/2016  CLINICAL DATA:  Sudden onset dizziness, vomiting and bilateral upper extremity weakness. EXAM: CT HEAD WITHOUT CONTRAST TECHNIQUE: Contiguous axial images were obtained from the base of the skull through the vertex  without intravenous contrast. COMPARISON:  None. FINDINGS: There is some limitation due to patient motion and streak artifacts, despite repeat imaging. Diffusely enlarged ventricles and subarachnoid spaces. No intracranial hemorrhage, mass lesion or CT evidence of acute infarction. Unremarkable bones and included paranasal sinuses. IMPRESSION: Mildly limited examination demonstrating mild atrophy with no acute abnormality. These results were called by telephone at the time of interpretation on 05/08/2016 at 6:09 pm to Jose Persia, the nurse working with Rutha Bouchard, PA-C , who verbally acknowledged these results. Electronically Signed   By: Beckie Salts M.D.   On: 05/08/2016 18:11   Ct Angio Neck W/cm &/or Wo/cm  05/10/2016  CLINICAL DATA:  Acute PICA stroke bilaterally right greater than left EXAM: CT ANGIOGRAPHY HEAD AND NECK TECHNIQUE: Multidetector CT imaging of the head and neck was performed using the standard protocol during bolus administration of intravenous contrast.  Multiplanar CT image reconstructions and MIPs were obtained to evaluate the vascular anatomy. Carotid stenosis measurements (when applicable) are obtained utilizing NASCET criteria, using the distal internal carotid diameter as the denominator. CONTRAST:  50 mL Isovue 370 IV COMPARISON:  MRI 05/10/2016 FINDINGS: CTA NECK Aortic arch: Mild atherosclerotic aortic arch without aneurysm or stenosis. Aortic arch is uncoiled. Proximal great vessels are tortuous but widely patent without stenosis. Lung apices clear Right carotid system: Right carotid artery widely patent and normal. Right carotid bifurcation normal. No stenosis or dissection Left carotid system: Left carotid widely patent. Negative for stenosis or dissection. No significant atherosclerotic disease Vertebral arteries:Both vertebral arteries widely patent to the basilar without stenosis. No dissection or stenosis. Skeleton: No acute abnormality Other neck: Multiple thyroid nodules. The largest nodule in the left upper pole is 13 mm. Thyroid ultrasound recommended. No adenopathy in the neck. CTA HEAD Anterior circulation: Cavernous carotid widely patent bilaterally without significant stenosis. Anterior and middle cerebral arteries widely patent bilaterally without stenosis. Posterior circulation: Both vertebral arteries patent to the basilar without stenosis. Left PICA patent. Right PICA small and may have decreased flow due to acute right PICA infarct. No thrombus identified. Basilar patent. Superior cerebellar and posterior cerebral arteries patent bilaterally. Fetal origin right posterior cerebral artery. Venous sinuses: Patent Anatomic variants: None Delayed phase: Normal enhancement on delayed imaging. The infarct does not enhance. No mass lesion. IMPRESSION: Acute PICA infarct right greater than left. There appears to be decreased flow in the right PICA which may be related to the acute infarct and partial occlusion. No filling defect or thrombus  identified. No vertebral artery stenosis or dissection is identified as a cause of the infarct. No significant carotid or vertebral artery stenosis in the neck. Negative for dissection. Electronically Signed   By: Marlan Palau M.D.   On: 05/10/2016 18:15   Mr Brain Wo Contrast  05/10/2016  CLINICAL DATA:  Severe dizziness with nausea and vomiting. Benign positional vertigo is suspected. EXAM: MRI HEAD WITHOUT CONTRAST TECHNIQUE: Multiplanar, multiecho pulse sequences of the brain and surrounding structures were obtained without intravenous contrast. COMPARISON:  Motion degraded CT head 05/08/2016. FINDINGS: The study had to be terminated prematurely as the patient required removal for severe dizziness with nausea and vomiting. There is a large area of acute infarction affecting the RIGHT inferior cerebellum. This involves most of the territory of the posterior inferior cerebellar artery on the RIGHT. Smaller areas of acute infarction affect the inferior cerebellum on the LEFT, medial PICA territory. Within limits for detection on MR, no hemorrhage. Cytotoxic edema is developing in the posterior fossa, the RIGHT  tonsil is swollen, and there is mild mass effect on the dorsolateral fourth ventricle. Mild cerebral and cerebellar atrophy. Mild subcortical and periventricular T2 and FLAIR hyperintensities, likely chronic microvascular ischemic change. Flow voids are maintained in the BILATERAL vertebral arteries and basilar artery; LEFT vertebral is slightly larger. Neither demonstrate signs of dissection or occlusion. No midline pituitary abnormality. Cerebellar tonsils are at the level of the foramen magnum without impaction at this time, although the RIGHT is lower. IMPRESSION: Large acute RIGHT cerebellar infarct, nonhemorrhagic, PICA territory. Much smaller infarct of the LEFT medial inferior cerebellum, also PICA territory. Mild mass effect on the fourth ventricle. The patient should be observed for signs of  developing obstructive hydrocephalus. A call is in to the care team. Electronically Signed   By: Elsie Stain M.D.   On: 05/10/2016 09:29    Microbiology: No results found for this or any previous visit (from the past 240 hour(s)).   Labs: CBC:  Recent Labs Lab 05/08/16 1750 05/09/16 0042 05/11/16 0347 05/12/16 1035  WBC 7.0 9.8 8.0 7.0  NEUTROABS 3.7  --  5.3 4.4  HGB 15.7 13.8 15.5 17.2*  HCT 44.8 40.5 44.6 49.7  MCV 94.9 95.3 96.5 94.0  PLT 212 211 182 195   Basic Metabolic Panel:  Recent Labs Lab 05/08/16 1750 05/09/16 0042 05/11/16 0347 05/12/16 1035 05/15/16 0612  NA 139  --  142 141  --   K 3.5  --  3.7 3.5  --   CL 110  --  105 106  --   CO2 22  --  24 23  --   GLUCOSE 139*  --  75 120*  --   BUN 17  --  11 13  --   CREATININE 1.01 0.97 0.84 0.94 0.90  CALCIUM 8.9  --  9.2 9.1  --    Liver Function Tests:  Recent Labs Lab 05/08/16 1750 05/11/16 0347 05/12/16 1035  AST 30 20 28   ALT 22 17 32  ALKPHOS 59 56 63  BILITOT 0.5 1.3* 1.3*  PROT 7.2 6.4* 6.5  ALBUMIN 4.1 3.2* 3.4*   No results for input(s): LIPASE, AMYLASE in the last 168 hours. No results for input(s): AMMONIA in the last 168 hours. Cardiac Enzymes:  Recent Labs Lab 05/08/16 1750 05/12/16 1035  CKTOTAL  --  193  TROPONINI <0.03  --    BNP (last 3 results) No results for input(s): BNP in the last 8760 hours. CBG:  Recent Labs Lab 05/08/16 1749 05/08/16 1909  GLUCAP 143* 120*   Time spent: 30 minutes  Signed:  Adamaris King  [facility]  05/15/2016  , 1:43 PM

## 2016-05-15 NOTE — Progress Notes (Signed)
Weldon PickingBLANKENSHIP, [name] Rehab Admission Coordinator Signed Physical Medicine and Rehabilitation PMR Pre-admission 05/15/2016 2:27 PM  Related encounter: ED to Hosp-Admission (Discharged) [facility]    Expand All Collapse All   PMR Admission Coordinator Pre-Admission Assessment  Patient: [name] is an 65 y.o., male MRN: [id number] DOB: 01/13/1951 Height: 6' (182.9 cm) Weight: 90.719 kg (200 lb)  Insurance Information HMO: PPO: PCP: IPA: 80/20: OTHER: Blue Options PRIMARY: BCBS State Policy#: [id number] Subscriber: self CM Name: Halina MaidensCheryl Kinsey Phone#: [phone] Fax#: [phone]  Pre-Cert#: [id number], approved for 05/15/16-05/24/16 with clinical updates [facility] to [facility] 05/23/16 following conference [employer]: [employer] Benefits: Phone #: [phone] Name: Tony NippleAris Berg. Date: 01/01/16 Deduct: $1250 Out of Pocket Max: $4350 Life Max: n/a CIR: 80%/20% SNF: 80%/20% Outpatient: 80% Co-Pay: 20% Home Health: 80% Co-Pay: 20% DME: 80% Co-Pay: 20% Providers: In network SECONDARY: Medicare A Policy#: [id number] Subscriber: self CM Name: Phone#: Fax#:  Pre-Cert#: Employer:  Benefits: Phone #: Name:  Berg. Date: Deduct: Out of Pocket Max: Life Max:  CIR: SNF:  Outpatient: Co-Pay:  Home Health: Co-Pay:  DME: Co-Pay:   Medicaid Application Date: Case Manager:  Disability Application Date: Case Worker:   Emergency Contact Information Contact Information    Name Relation Home Work Mobile   [name],[name] Spouse [phone]     [name],[name] Daughter [phone]        Current Medical History  Patient Admitting Diagnosis: Right>left cerebellar infarcts History of Present Illness: Tony LoJohnny Scheeler is a 65 y.o. male in relatively good health who was admitted on 05/09 with sudden onset of dizziness, N/V and weakness. CT head negative and MRI brain aborted due to nausea and abdominal discomfort. Neurology evaluated patient and he was found to have no focal deficits except nystagmus on right lateral gaze. He has had significant vertigo was treated for dizziness. MRI brain done today showing large area of acute infarct in right cerebellar infarct involving most of PICA on right and small areas of acute infarct in left inferior cerebellum with mild mass effect on 4th ventricle. 2D echo with EF 55-60% with grade 1 diastolic dysfunction and mild MVR. Bilateral PICA infarcts felt to be embolic of unknown etiology and patient on ASA for secondary stroke prevention.   Patient with resultant dizziness and problems with ambulation due to vestibular issues. He has had complaints of BLE pain and BLE dopplers done 04/12--negative for DVT. Intractable N/V is resolving and he is tolerating regular diet. Will need outpatient 30 day loop monitor to rule out Afib as cause of stroke. Vestibular symptoms improving with compensatory techniques and mobility improved.  Total: 0 NIH    Past Medical History  Past Medical History  Diagnosis Date  . Healthy adult     Family History  family history includes Cancer in his brother; Diabetes in his father; Stroke (age of onset: 6583) in his mother.  Prior Rehab/Hospitalizations:  Has the patient had major surgery during 100 days prior to admission? No  Current Medications   Current facility-administered medications:  . acetaminophen (TYLENOL) tablet 650 mg, 650 mg, Oral, Q4H PRN, 650 mg at 05/13/16 2134 **OR** acetaminophen (TYLENOL) suppository 650 mg, 650 mg, Rectal, Q4H PRN, Alberteen Samhristopher P Danford, MD . aspirin EC  tablet 325 mg, 325 mg, Oral, Daily, Rolly SalterPranav M Patel, MD, 325 mg at 05/15/16 0914 . atorvastatin (LIPITOR) tablet 40 mg, 40 mg, Oral, q1800, Rolly SalterPranav M Patel, MD, 40 mg at 05/14/16 1717 . diphenhydrAMINE (BENADRYL) capsule 25 mg,  25 mg, Oral, QHS PRN, Rolly Salter, MD, 25 mg at 05/14/16 2050 . enoxaparin (LOVENOX) injection 40 mg, 40 mg, Subcutaneous, Q24H, Alberteen Sam, MD, 40 mg at 05/15/16 0914 . meclizine (ANTIVERT) tablet 12.5 mg, 12.5 mg, Oral, TID PRN, Rolly Salter, MD, 12.5 mg at 05/13/16 1940 . metoprolol tartrate (LOPRESSOR) tablet 12.5 mg, 12.5 mg, Oral, BID, Rolly Salter, MD, 12.5 mg at 05/15/16 0914 . ondansetron (ZOFRAN) injection 4 mg, 4 mg, Intravenous, Q8H PRN, 4 mg at 05/09/16 0327 **OR** ondansetron (ZOFRAN) tablet 4 mg, 4 mg, Oral, Q8H PRN, Alberteen Sam, MD, 4 mg at 05/09/16 1112 . senna-docusate (Senokot-S) tablet 1 tablet, 1 tablet, Oral, QHS PRN, Alberteen Sam, MD . zolpidem (AMBIEN) tablet 5 mg, 5 mg, Oral, QHS PRN, Rolly Salter, MD, 5 mg at 05/12/16 2227  Patients Current Diet: Diet Heart Room service appropriate?: Yes; Fluid consistency:: Thin Diet - low sodium heart healthy  Precautions / Restrictions Precautions Precautions: Fall Restrictions Weight Bearing Restrictions: No   Has the patient had 2 or more falls or a fall with injury in the past year?No  Prior Activity Level Community (5-7x/wk): Pt. 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 None   Prior Functional Level Prior Function Level of Independence: Independent  Self Care: Did the patient need help bathing, dressing, using the toilet or  eating? Independent  Indoor Mobility: Did the patient need assistance with walking from room to room (with or without device)? Independent  Stairs: Did the patient need assistance with internal or external stairs (with or without device)? Independent  Functional Cognition: Did the patient need help planning regular tasks such as shopping or remembering to take medications? Independent  Current Functional Level Cognition  Overall Cognitive Status: Impaired/Different from baseline Orientation Level: Oriented X4 General Comments: pt just worked with OT and reports "I guess i'm not as good as I thought"   Extremity Assessment (includes Sensation/Coordination)  Upper Extremity Assessment: Generalized weakness RUE Deficits / Details: AROM WFL, strength shoulder flexcion 4/5, elbow flex/extension 4/5, grip strength weak LUE Deficits / Details: AROM WFL, strength shoulder flexcion 4/5, elbow flex/extension 4/5, grip strength weak  Lower Extremity Assessment: Defer to PT evaluation RLE Deficits / Details: reports feeling weaker LLE Deficits / Details: AROM WFL, strength hip flexion 4-/5, knee extension 4+/5, ankle DF 4/5; reports sensation intact    ADLs  Overall ADL's : Needs assistance/impaired Eating/Feeding: Set up, Sitting Grooming: Wash/dry hands, Wash/dry face, Oral care, Minimal assistance, Standing Grooming Details (indicate cue type and reason): LOB with head turns Upper Body Bathing: Supervision/ safety, Sitting Lower Body Bathing: Minimal assistance, Sit to/from stand Lower Body Bathing Details (indicate cue type and reason): able to sit and cross bil LE. pt able to lean forward and reach socks. pt does reports dizziness with looking downa nd then up. Task that involve sitting to standing such as pulling up pants increases patients fall risk. Pt fall risk attempting to reach down in a shower static standing Upper Body Dressing : Supervision/safety Upper Body Dressing  Details (indicate cue type and reason): sitting Lower Body Dressing: Minimal assistance, Sit to/from stand Toilet Transfer: Minimal assistance, Ambulation Tub/ Shower Transfer:  Moderate assistance Tub/Shower Transfer Details (indicate cue type and reason): pt with LOB attempting single leg standing immediately. Pt reaching for environmental support Functional mobility during ADLs: Moderate assistance, Minimal assistance General ADL Comments: Pt attempting aspects of DGI this session and visual assessment.     Mobility  Overal bed mobility: Needs Assistance Bed Mobility: Supine to Sit, Sit to Supine Supine to sit: Supervision Sit to supine: Supervision General bed mobility comments: pt in chair    Transfers  Overall transfer level: Needs assistance Equipment used: None Transfers: Sit to/from Stand Sit to Stand: Min guard General transfer comment: pt unsteady upon initial standing requiring min guard to steady and v/c's to perform gaze stabilization    Ambulation / Gait / Stairs / Wheelchair Mobility  Ambulation/Gait Ambulation/Gait assistance: Architect (Feet): 200 Feet Assistive device: None Gait Pattern/deviations: Step-through pattern, Decreased stride length, Narrow base of support General Gait Details: max directional v/c's to perform gaze stabilization as pt with onset of dizziness with head turns both vertical and horizontal.  Gait velocity: decreased Gait velocity interpretation: Below normal speed for age/gender    Posture / Balance Static Standing Balance Single Leg Stance - Right Leg: (able to tolerate 10 sec) Single Leg Stance - Left Leg: (unable to achieve without minA) Balance Overall balance assessment: Needs assistance Sitting-balance support: Feet supported, No upper extremity supported Sitting balance-Leahy Scale: Good Standing balance support: No upper extremity supported Standing balance-Leahy Scale: Fair Standing balance  comment: pt with LOB in a dynamic environment. pt able to maintain balance better in a static environment.  Single Leg Stance - Right Leg: (able to tolerate 10 sec) Single Leg Stance - Left Leg: (unable to achieve without minA) High level balance activites: Side stepping, Backward walking High Level Balance Comments: constant LOB with backwards walking and side stopping, improved with gaze stabilization exercises. pt with reports of onset of dizziness when looking down for foot placement Standardized Balance Assessment Standardized Balance Assessment : Dynamic Gait Index Dynamic Gait Index Level Surface: Mild Impairment Change in Gait Speed: Mild Impairment Gait with Horizontal Head Turns: Moderate Impairment    Special needs/care consideration BiPAP/CPAP no CPM no Continuous Drip IV no Dialysis no  Life Vest No  Oxygen no Special Bed no Trach Size no Wound Vac (area) no  Skin Pt. Denies skin issues  Bowel mgmt: Last BM 05/15/16 , continent, to bathroom with assist Bladder mgmt: using urinal due to bed alarm in place; continent Diabetic mgmt no     Previous Home Environment Living Arrangements: Spouse/significant other Available Help at Discharge: Family, Available 24 hours/day (wife works on a PRN basis as a Training and development officer (retired t) Type of Home: House Home Layout: One level Home Access: Stairs to enter Secretary/administrator of Steps: 2 Bathroom Shower/Tub: Engineer, manufacturing systems: Standard Bathroom Accessibility: Yes How Accessible: Accessible via walker Home Care Services: No  Discharge Living Setting Plans for Discharge Living Setting: Patient's home Type of Home at Discharge: House Discharge Home Layout: One level Discharge Home Access: Stairs to enter Entrance Stairs-Number of Steps: 2 Discharge Bathroom Shower/Tub: Tub/shower unit Discharge Bathroom Toilet: Standard Discharge  Bathroom Accessibility: Yes How Accessible: Accessible via walker Does the patient have any problems obtaining your medications?: No  Social/Family/Support Systems Patient Roles: Spouse Anticipated Caregiver: wife, [name] Anticipated Caregiver's Contact Information: [phone] Ability/Limitations of Caregiver: none, wife works as a Lawyer on a PRN basis but is available as much as needed for pt. Caregiver Availability: 24/7  Discharge Plan Discussed with Primary Caregiver: Yes Is Caregiver In Agreement with Plan?: Yes Does Caregiver/Family have Issues with Lodging/Transportation while Pt is in Rehab?: No   Goals/Additional Needs Patient/Family Goal for Rehab: modified independent PT/OT; n./a SLP Expected length of stay: 7-9 days Cultural Considerations: n/a Dietary Needs: heart healthy diet , thin liquids Equipment Needs: TBA Pt/Family Agrees to Admission and willing to participate: Yes Program Orientation Provided & Reviewed with Pt/Caregiver Including Roles & Responsibilities: Yes   Decrease burden of Care through IP rehab admission: n/a   Possible need for SNF placement upon discharge: Not anticipated   Patient Condition: This patient's medical and functional status has changed since the consult dated: 05/11/16 in which the Rehabilitation Physician determined and documented that the patient's condition is appropriate for intensive rehabilitative care in an inpatient rehabilitation facility. See "History of Present Illness" (above) for medical update. Functional changes are: min assist 200' with no device; LOB with head turns and with single leg standing as well as visual changes. Patient's medical and functional status update has been discussed with the Rehabilitation physician and patient remains appropriate for inpatient rehabilitation. Will admit to inpatient rehab today.  Preadmission Screen Completed By: Weldon Picking, 05/15/2016 2:42  PM ______________________________________________________________________  Discussed status with Dr. Riley Kill on 05/15/16 at 1441 and received telephone approval for admission today.  Admission Coordinator: Weldon Picking, time 1610 Dorna Bloom 05/15/16          Cosigned by: Ranelle Oyster, MD at 05/15/2016 2:52 PM  Revision History     Date/Time User Provider Type Action   05/15/2016 2:52 PM Ranelle Oyster, MD Physician Cosign   05/15/2016 2:42 PM Weldon Picking Rehab Admission Coordinator Sign

## 2016-05-15 NOTE — Progress Notes (Signed)
Wanted to transfer patient down to [address] west, but rehab therapist is still with patient at this time.

## 2016-05-15 NOTE — PMR Pre-admission (Signed)
PMR Admission Coordinator Pre-Admission Assessment  Patient: [name] is an 65 y.o., male MRN: [id number] DOB: July 18, 1951 Height: 6' (182.9 cm) Weight: 90.719 kg (200 lb)              Insurance Information HMO:     PPO:      PCP:      IPA:      80/20:      OTHER: [employer] PRIMARY:  BCBS [place]      Policy#:  [id number]      Subscriber:  self CM Name:  Halina Maidens      Phone#:  [phone]     Fax#:  [phone]   Pre-Cert#:  [id number], approved for 05/15/16-05/24/16 with clinical updates due to [facility] on 05/23/16 following conference      Employer:  [employer] Benefits:  Phone #:  [phone]     Name: Vonzella Nipple. Date:  01/01/16     Deduct:  $1250      Out of Pocket Max:  $4350      Life Max:  n/a CIR:  80%/20%      SNF:  80%/20% Outpatient:  80%     Co-Pay: 20% Home Health:  80%      Co-Pay:  20% DME:  80%     Co-Pay:  20% Providers:  In network SECONDARY:  Medicare A       Policy#:  [id number]      Subscriber:  self CM Name:       Phone#:      Fax#:  Pre-Cert#:       Employer:  Benefits:  Phone #:      Name:  Eff. Date:      Deduct:       Out of Pocket Max:       Life Max:  CIR:       SNF:  Outpatient:      Co-Pay:  Home Health:       Co-Pay:  DME:      Co-Pay:   Medicaid Application Date:       Case Manager:  Disability Application Date:       Case Worker:   Emergency Contact Information Contact Information    Name Relation Home Work Mobile   [place] Spouse [phone]     [place], [place] Daughter [phone]       Current Medical History  Patient Admitting Diagnosis: Right>left cerebellar infarcts History of Present Illness: [name] is a 65 y.o. male in relatively good health who was admitted on 05/09 with sudden onset of dizziness, N/V and weakness. CT head negative and MRI brain aborted due to nausea and abdominal discomfort. Neurology evaluated patient and he was found to have no focal deficits except nystagmus on right lateral  gaze. He has had significant vertigo was treated for dizziness. MRI brain done today showing large area of acute infarct in right cerebellar infarct involving most of PICA on right and small areas of acute infarct in left inferior cerebellum with mild mass effect on 4th ventricle. 2D echo with EF 55-60% with grade 1 diastolic dysfunction and mild MVR. Bilateral PICA infarcts felt to be embolic of unknown etiology and patient on ASA for secondary stroke prevention.   Patient with resultant dizziness and problems with ambulation due to vestibular issues. He has had complaints of BLE pain and BLE dopplers done 04/12--negative for DVT. Intractable N/V is resolving and he is tolerating regular diet. Will need outpatient 30 day loop monitor to  rule out Afib as cause of stroke. Vestibular symptoms improving with compensatory techniques and mobility improved.  Total: 0 NIH    Past Medical History  Past Medical History  Diagnosis Date  . Healthy adult     Family History  family history includes Cancer in his brother; Diabetes in his father; Stroke (age of onset: 47) in his mother.  Prior Rehab/Hospitalizations:  Has the patient had major surgery during 100 days prior to admission? No  Current Medications   Current facility-administered medications:  .  acetaminophen (TYLENOL) tablet 650 mg, 650 mg, Oral, Q4H PRN, 650 mg at 05/13/16 2134 **OR** acetaminophen (TYLENOL) suppository 650 mg, 650 mg, Rectal, Q4H PRN, Alberteen Sam, MD .  aspirin EC tablet 325 mg, 325 mg, Oral, Daily, Rolly Salter, MD, 325 mg at 05/15/16 0914 .  atorvastatin (LIPITOR) tablet 40 mg, 40 mg, Oral, q1800, Rolly Salter, MD, 40 mg at 05/14/16 1717 .  diphenhydrAMINE (BENADRYL) capsule 25 mg, 25 mg, Oral, QHS PRN, Rolly Salter, MD, 25 mg at 05/14/16 2050 .  enoxaparin (LOVENOX) injection 40 mg, 40 mg, Subcutaneous, Q24H, Alberteen Sam, MD, 40 mg at 05/15/16 0914 .  meclizine (ANTIVERT) tablet 12.5 mg,  12.5 mg, Oral, TID PRN, Rolly Salter, MD, 12.5 mg at 05/13/16 1940 .  metoprolol tartrate (LOPRESSOR) tablet 12.5 mg, 12.5 mg, Oral, BID, Rolly Salter, MD, 12.5 mg at 05/15/16 0914 .  ondansetron (ZOFRAN) injection 4 mg, 4 mg, Intravenous, Q8H PRN, 4 mg at 05/09/16 0327 **OR** ondansetron (ZOFRAN) tablet 4 mg, 4 mg, Oral, Q8H PRN, Alberteen Sam, MD, 4 mg at 05/09/16 1112 .  senna-docusate (Senokot-S) tablet 1 tablet, 1 tablet, Oral, QHS PRN, Alberteen Sam, MD .  zolpidem (AMBIEN) tablet 5 mg, 5 mg, Oral, QHS PRN, Rolly Salter, MD, 5 mg at 05/12/16 2227  Patients Current Diet: Diet Heart Room service appropriate?: Yes; Fluid consistency:: Thin Diet - low sodium heart healthy  Precautions / Restrictions Precautions Precautions: Fall Restrictions Weight Bearing Restrictions: No   Has the patient had 2 or more falls or a fall with injury in the past year?No  Prior Activity Level Community (5-7x/wk): Pt. 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 None   Prior Functional Level Prior Function Level of Independence: Independent  Self Care: Did the patient need help bathing, dressing, using the toilet or eating?  Independent  Indoor Mobility: Did the patient need assistance with walking from room to room (with or without device)? Independent  Stairs: Did the patient need assistance with internal or external stairs (with or without device)? Independent  Functional Cognition: Did the patient need help planning regular tasks such as shopping or remembering to take medications? Independent  Current Functional Level Cognition  Overall Cognitive Status: Impaired/Different  from baseline Orientation Level: Oriented X4 General Comments: pt just worked with OT and reports "I guess i'm not as good as I thought"    Extremity Assessment (includes Sensation/Coordination)  Upper Extremity Assessment: Generalized weakness RUE Deficits / Details: AROM WFL, strength shoulder flexcion 4/5, elbow flex/extension 4/5, grip strength weak LUE Deficits / Details: AROM WFL, strength shoulder flexcion 4/5, elbow flex/extension 4/5, grip  strength weak  Lower Extremity Assessment: Defer to PT evaluation RLE Deficits / Details: reports feeling weaker LLE Deficits / Details: AROM WFL, strength hip flexion 4-/5, knee extension 4+/5, ankle DF 4/5; reports sensation intact    ADLs  Overall ADL's : Needs assistance/impaired Eating/Feeding: Set up, Sitting Grooming: Wash/dry hands, Wash/dry face, Oral care, Minimal assistance, Standing Grooming Details (indicate cue type and reason): LOB with head turns Upper Body Bathing: Supervision/ safety, Sitting Lower Body Bathing: Minimal assistance, Sit to/from stand Lower Body Bathing Details (indicate cue type and reason): able to sit and cross bil LE. pt able to lean forward and reach socks. pt does reports dizziness with looking downa nd then up. Task that involve sitting to standing such as pulling up pants increases patients fall risk. Pt fall risk attempting to reach down in a shower static standing Upper Body Dressing : Supervision/safety Upper Body Dressing Details (indicate cue type and reason): sitting Lower Body Dressing: Minimal assistance, Sit to/from stand Toilet Transfer: Minimal assistance, Ambulation Tub/ Shower Transfer: Moderate assistance Tub/Shower Transfer Details (indicate cue type and reason): pt with LOB attempting single leg standing immediately. Pt reaching for environmental support Functional mobility during ADLs: Moderate assistance, Minimal assistance General ADL Comments: Pt attempting aspects of DGI this session  and visual assessment.     Mobility  Overal bed mobility: Needs Assistance Bed Mobility: Supine to Sit, Sit to Supine Supine to sit: Supervision Sit to supine: Supervision General bed mobility comments: pt in chair    Transfers  Overall transfer level: Needs assistance Equipment used: None Transfers: Sit to/from Stand Sit to Stand: Min guard General transfer comment: pt unsteady upon initial standing requiring min guard to steady and v/c's to perform gaze stabilization    Ambulation / Gait / Stairs / Wheelchair Mobility  Ambulation/Gait Ambulation/Gait assistance: ArchitectMin assist Ambulation Distance (Feet): 200 Feet Assistive device: None Gait Pattern/deviations: Step-through pattern, Decreased stride length, Narrow base of support General Gait Details: max directional v/c's to perform gaze stabilization as pt with onset of dizziness with head turns both vertical and horizontal.  Gait velocity: decreased Gait velocity interpretation: Below normal speed for age/gender    Posture / Balance Static Standing Balance Single Leg Stance - Right Leg:  (able to tolerate 10 sec) Single Leg Stance - Left Leg:  (unable to achieve without minA) Balance Overall balance assessment: Needs assistance Sitting-balance support: Feet supported, No upper extremity supported Sitting balance-Leahy Scale: Good Standing balance support: No upper extremity supported Standing balance-Leahy Scale: Fair Standing balance comment: pt with LOB in a dynamic environment. pt able to maintain balance better in a static environment.  Single Leg Stance - Right Leg:  (able to tolerate 10 sec) Single Leg Stance - Left Leg:  (unable to achieve without minA) High level balance activites: Side stepping, Backward walking High Level Balance Comments: constant LOB with backwards walking and side stopping, improved with gaze stabilization exercises. pt with reports of onset of dizziness when looking down for foot  placement Standardized Balance Assessment Standardized Balance Assessment : Dynamic Gait Index Dynamic Gait Index Level Surface: Mild Impairment Change in Gait Speed: Mild Impairment Gait with Horizontal Head Turns: Moderate Impairment    Special needs/care consideration BiPAP/CPAP   no CPM   no Continuous Drip IV   no Dialysis   no        Life Vest   No  Oxygen   no Special Bed   no Trach Size   no Wound Vac (area)  no       Skin   Pt. Denies skin issues                              Bowel mgmt:  Last BM 05/15/16 , continent, to bathroom with assist Bladder mgmt: using urinal due to bed alarm in place; continent Diabetic mgmt   no     Previous Home Environment Living Arrangements: Spouse/significant other Available Help at Discharge: Family, Available 24 hours/day (wife works on a PRN basis as a Training and development officer (retired t) Type of Home: House Home Layout: One level Home Access: Stairs to enter Secretary/administrator of Steps: 2 Bathroom Shower/Tub: Engineer, manufacturing systems: Standard Bathroom Accessibility: Yes How Accessible: Accessible via walker Home Care Services: No  Discharge Living Setting Plans for Discharge Living Setting: Patient's home Type of Home at Discharge: House Discharge Home Layout: One level Discharge Home Access: Stairs to enter Entrance Stairs-Number of Steps: 2 Discharge Bathroom Shower/Tub: Tub/shower unit Discharge Bathroom Toilet: Standard Discharge Bathroom Accessibility: Yes How Accessible: Accessible via walker Does the patient have any problems obtaining your medications?: No  Social/Family/Support Systems Patient Roles: Spouse Anticipated Caregiver: wife, Elba Dendinger Anticipated Caregiver's Contact Information: [phone] Ability/Limitations of Caregiver: none, wife works as a Lawyer on a PRN basis but is available as much as needed for pt. Caregiver Availability: 24/7 Discharge Plan Discussed with Primary  Caregiver: Yes Is Caregiver In Agreement with Plan?: Yes Does Caregiver/Family have Issues with Lodging/Transportation while Pt is in Rehab?: No   Goals/Additional Needs Patient/Family Goal for Rehab: modified independent PT/OT; n./a SLP Expected length of stay: 7-9 days Cultural Considerations: n/a Dietary Needs: heart healthy diet , thin liquids Equipment Needs: TBA Pt/Family Agrees to Admission and willing to participate: Yes Program Orientation Provided & Reviewed with Pt/Caregiver Including Roles  & Responsibilities: Yes   Decrease burden of Care through IP rehab admission: n/a   Possible need for SNF placement upon discharge:    Not anticipated   Patient Condition: This patient's medical and functional status has changed since the consult dated:  05/11/16 in which the Rehabilitation Physician determined and documented that the patient's condition is appropriate for intensive rehabilitative care in an inpatient rehabilitation facility. See "History of Present Illness" (above) for medical update. Functional changes are: min assist 200' with no device; LOB with head turns and with single leg standing as well as visual changes. Patient's medical and functional status update has been discussed with the Rehabilitation physician and patient remains appropriate for inpatient rehabilitation. Will admit to inpatient rehab today.  Preadmission Screen Completed By:  Weldon Picking, 05/15/2016 2:42 PM ______________________________________________________________________   Discussed status with Dr.  Riley Kill on 05/15/16 at  1441  and received telephone approval for admission today.  Admission Coordinator:  Weldon Picking, time [id number] 05/15/16

## 2016-05-15 NOTE — Telephone Encounter (Signed)
Pt d/c from hospital today and will go to rehab -has appt for 30 day event monitor on 05-17-16 does he need to cxl?

## 2016-05-15 NOTE — Telephone Encounter (Signed)
The pt has a new pt./post hospital OV scheduled with Dr Eldridge DaceVaranasi on 06/28/16. He is scheduled to get a Event Monitor on 5/18. Sunny SchleinSusan Mccaffery, the pts EC, wants to know if the pt can do rehab while wearing monitor or should they reschedule monitor until after the pt finishes rehab. Please advise soon as the pt is scheduled to get monitor on 5/18.

## 2016-05-15 NOTE — Progress Notes (Signed)
Physical Therapy Treatment Patient Details Name: [name] MRN: [id number] DOB: 20-Oct-1951 Today's Date: 05/15/2016    History of Present Illness [name] is an 65 y.o. male with no previously documented medical history abnormal medications who was brought to the emergency room and code stroke status following acute onset of vertigo and nausea as well as feeling of weakness involving upper extremities primarily.  MRI positive for Large acute R cerebellar infarct PICA terriotory and smaller L medial inferior cerebellar infarct also PICA terriotory.    PT Comments    Pt with more insight to deficits s/p session with OT however con't to remain at significant falls risk due to onset of dizziness with head movement with ambulation and standing. Pt requiring min/modA to maintain balance during higher level tasks, pt demo's poor depth perception, and con't to require constant v/c's to perform compensatory techniques ie. Gaze stabilization. Wife very supportive and involved in care. Con't to highly recommend CIR upon d/c to achieve safe supervision level of function for safe d/c home with spouse who will provide 24/7 assist.  Follow Up Recommendations  CIR     Equipment Recommendations   (TBD)    Recommendations for Other Services Rehab consult;OT consult     Precautions / Restrictions Precautions Precautions: Fall Restrictions Weight Bearing Restrictions: No    Mobility  Bed Mobility               General bed mobility comments: pt in chair  Transfers Overall transfer level: Needs assistance Equipment used: None Transfers: Sit to/from Stand Sit to Stand: Min guard         General transfer comment: pt unsteady upon initial standing requiring min guard to steady and v/c's to perform gaze stabilization  Ambulation/Gait Ambulation/Gait assistance: Min assist Ambulation Distance (Feet): 200 Feet Assistive device: None Gait Pattern/deviations: Step-through  pattern;Decreased stride length;Narrow base of support Gait velocity: decreased Gait velocity interpretation: Below normal speed for age/gender General Gait Details: max directional v/c's to perform gaze stabilization as pt with onset of dizziness with head turns both vertical and horizontal.    Stairs            Wheelchair Mobility    Modified Rankin (Stroke Patients Only) Modified Rankin (Stroke Patients Only) Pre-Morbid Rankin Score: No symptoms Modified Rankin: Moderately severe disability     Balance Overall balance assessment: Needs assistance         Standing balance support: No upper extremity supported Standing balance-Leahy Scale: Fair Standing balance comment: pt with LOB in a dynamic environment. pt able to maintain balance better in a static environment.  Single Leg Stance - Right Leg:  (able to tolerate 10 sec) Single Leg Stance - Left Leg:  (unable to achieve without minA)         High level balance activites: Side stepping;Backward walking High Level Balance Comments: constant LOB with backwards walking and side stopping, improved with gaze stabilization exercises. pt with reports of onset of dizziness when looking down for foot placement    Cognition Arousal/Alertness: Awake/alert Behavior During Therapy: WFL for tasks assessed/performed Overall Cognitive Status: Impaired/Different from baseline Area of Impairment: Awareness           Awareness: Anticipatory   General Comments: pt just worked with OT and reports "I guess i'm not as good as I thought"    Exercises Other Exercises Other Exercises: VORx1 both vertical and horizontal, 3 reps, 3x/day    General Comments        Pertinent Vitals/Pain  Pain Assessment: No/denies pain    Home Living Family/patient expects to be discharged to:: Private residence Living Arrangements: Spouse/significant other Available Help at Discharge: Family Type of Home: House Home Access: Stairs to  enter   Home Layout: One level Home Equipment: None      Prior Function Level of Independence: Independent          PT Goals (current goals can now be found in the care plan section) Acute Rehab PT Goals Patient Stated Goal: to get back to normal Progress towards PT goals: Progressing toward goals    Frequency  Min 4X/week    PT Plan Current plan remains appropriate    Co-evaluation             End of Session Equipment Utilized During Treatment: Gait belt Activity Tolerance: Patient tolerated treatment well Patient left: in chair;with call bell/phone within reach;with family/visitor present     Time: 1200-1225 PT Time Calculation (min) (ACUTE ONLY): 25 min  Charges:  $Gait Training: 8-22 mins $Therapeutic Activity: 8-22 mins                    G Codes:      Marcene BrawnChadwell, Jacelyn Cuen Marie 05/15/2016, 1:30 PM   Lewis ShockAshly Tamisha Nordstrom, PT, DPT Pager #: [phone] Office #: [phone]

## 2016-05-15 NOTE — H&P (View-Only) (Signed)
Physical Medicine and Rehabilitation Admission H&P    Chief Complaint  Patient presents with  . Dizziness with balance deficits    HPI: [name] is a 65 y.o. male in relatively good health who was admitted on 05/09 with sudden onset of dizziness, N/V and weakness. CT head negative and MRI brain aborted due to nausea and abdominal discomfort. Neurology evaluated patient and he was found to have no focal deficits except nystagmus on right lateral gaze. He has had significant vertigo was treated for dizziness. MRI brain done today showing large area of acute infarct in right cerebellar infarct involving most of PICA on right and small areas of acute infarct in left inferior cerebellum with mild mass effect on 4th ventricle. 2D echo with EF 55-60% with grade 1 diastolic dysfunction and mild MVR. Bilateral PICA infarcts felt to be embolic of unknown etiology and patient on ASA for secondary stroke prevention.   He has had complaints of BLE pain and BLE dopplers done 04/12--negative for  DVT.  Intractable N/V is resolving and he is tolerating regular diet. Will need outpatient 30 day loop monitor to rule out Afib as cause of stroke.  Vomiting has resolved with improvement in po intake and nausea now limited with certain movements.  Patient with resultant dizziness, gaze instability and balance deficits affecting mobility. CIR recommended for follow up therapy.   Review of Systems  HENT: Negative for hearing loss.   Eyes: Positive for blurred vision. Negative for double vision and photophobia.  Respiratory: Negative for cough and shortness of breath.   Cardiovascular: Negative for chest pain and palpitations.  Gastrointestinal: Positive for nausea (with head turns). Negative for heartburn and vomiting.  Genitourinary: Negative for dysuria and urgency.  Musculoskeletal: Negative for myalgias, back pain and joint pain.  Skin: Negative for itching and rash.  Neurological: Positive for  dizziness. Negative for speech change, focal weakness and headaches.  Psychiatric/Behavioral: Negative for depression and memory loss. The patient is not nervous/anxious.       Past Medical History  Diagnosis Date  . Healthy adult       History reviewed. No pertinent past surgical history.    Family History  Problem Relation Age of Onset  . Stroke Mother 51  . Diabetes Father   . 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 He quit tobacco 33 years ago. He has never used smokeless tobacco. He reports that he does not drink alcohol or use illicit drugs.    Allergies: No Known Allergies    No prescriptions prior to admission    Home: Home Living Family/patient expects to be discharged to:: Private residence Living Arrangements: Spouse/significant other Available Help at Discharge: Family Type of Home: House Home Access: Stairs to enter Technical brewer of Steps: 2 Home Layout: One level Bathroom Shower/Tub: Chiropodist: Standard Home Equipment: None   Functional History: Prior Function Level of Independence: Independent  Functional Status:  Mobility: Bed Mobility Overal bed mobility: Needs Assistance Bed Mobility: Supine to Sit, Sit to Supine Supine to sit: Supervision Sit to supine: Supervision General bed mobility comments: pt in chair Transfers Overall transfer level: Needs assistance Equipment used: None Transfers: Sit to/from Stand Sit to Stand: Min guard General transfer comment: pt unsteady upon initial standing requiring min guard to steady and v/c's to perform gaze  stabilization Ambulation/Gait Ambulation/Gait assistance: Min assist Ambulation Distance (Feet): 200 Feet Assistive device: None Gait Pattern/deviations: Step-through pattern, Decreased stride length, Narrow base of support General Gait  Details: max directional v/c's to perform gaze stabilization as pt with onset of dizziness with head turns both vertical and horizontal.  Gait velocity: decreased Gait velocity interpretation: Below normal speed for age/gender    ADL: ADL Overall ADL's : Needs assistance/impaired Eating/Feeding: Set up, Sitting Grooming: Wash/dry hands, Wash/dry face, Oral care, Minimal assistance, Standing Grooming Details (indicate cue type and reason): LOB with head turns Upper Body Bathing: Supervision/ safety, Sitting Lower Body Bathing: Minimal assistance, Sit to/from stand Lower Body Bathing Details (indicate cue type and reason): able to sit and cross bil LE. pt able to lean forward and reach socks. pt does reports dizziness with looking downa nd then up. Task that involve sitting to standing such as pulling up pants increases patients fall risk. Pt fall risk attempting to reach down in a shower static standing Upper Body Dressing : Supervision/safety Upper Body Dressing Details (indicate cue type and reason): sitting Lower Body Dressing: Minimal assistance, Sit to/from stand Toilet Transfer: Minimal assistance, Ambulation Tub/ Shower Transfer: Moderate assistance Tub/Shower Transfer Details (indicate cue type and reason): pt with LOB attempting single leg standing immediately. Pt reaching for environmental support Functional mobility during ADLs: Moderate assistance, Minimal assistance General ADL Comments: Pt attempting aspects of DGI this session and visual assessment.   Cognition: Cognition Overall Cognitive Status: Impaired/Different from baseline Orientation Level: Oriented X4 Cognition Arousal/Alertness: Awake/alert Behavior During Therapy: WFL for tasks assessed/performed Overall Cognitive Status: Impaired/Different from baseline Area of Impairment: Awareness Awareness: Anticipatory General Comments: pt just worked with OT and reports "I guess i'm not as good as I thought"   Blood  pressure 121/83, pulse 72, temperature 98 F (36.7 C), temperature source Oral, resp. rate 16, height 6' (1.829 m), weight 90.719 kg (200 lb), SpO2 100 %. Physical Exam  Nursing note and vitals reviewed. Constitutional: He is oriented to person, place, and time. He appears well-developed and well-nourished.  Was up at sink washing hands independently.   HENT:  Head: Normocephalic and atraumatic.  Eyes: Conjunctivae and EOM are normal. Pupils are equal, round, and reactive to light.  Neck: Normal range of motion. Neck supple.  Cardiovascular: Normal rate and regular rhythm.   No murmur heard. Respiratory: Effort normal and breath sounds normal. No stridor. No respiratory distress. He has no wheezes.  GI: Soft. Bowel sounds are normal. He exhibits no distension. There is no tenderness.  Musculoskeletal: Normal range of motion. He exhibits no tenderness.  Neurological: He is alert and oriented to person, place, and time.  Speech clear.  Able to follow multi-step commands without difficulty. Impulsive due to decreased safety awareness.   Right > left Horizontal nystagmus --dizzy with head turns to right. 4-5 beats nystagmus to right 0-1 on left. BUE decrease in fine motor movements and mild pronator drift although improving. Strength grossly 4/5 prox to distal in the bilateral UE. LE also 4/5. No sensory findings. Cognitively intact..   Skin: Skin is warm and dry.  Psychiatric: He has a normal mood and affect. His behavior is normal. Judgment and thought content normal.    Results for orders placed or performed during the hospital encounter of 05/08/16 (from the past 48 hour(s))  Creatinine, serum     Status: None   Collection Time: 05/15/16  6:12 AM  Result Value Ref Range   Creatinine, Ser 0.90 0.61 -  1.24 mg/dL   GFR calc non Af Amer >60 >60 mL/min   GFR calc Af Amer >60 >60 mL/min    Comment: (NOTE) The eGFR has been calculated using the CKD EPI equation. This calculation has not been  validated in all clinical situations. eGFR's persistently <60 mL/min signify possible Chronic Kidney Disease.    No results found.     Medical Problem List and Plan: 1.  Truncal ataxia/balance and functional deficits secondary to bilateral cerebellar infarcts 2.  DVT Prophylaxis/Anticoagulation: Pharmaceutical: Lovenox 3. Pain Management: N/A 4. Mood: Very motivated.  LCSW to follow for evaluation and support.  5. Neuropsych: This patient is capable of making decisions on his own behalf. Safety precautions emphasized.  6. Skin/Wound Care: Maintain adequate  Nutrition and hydration.  7. Fluids/Electrolytes/Nutrition: Monitor I/O. Check lytes in am. Offer supplements between meals prn  Poor intake.  8. Grade 1 diastolic dysfunction/Blood pressure: Metoprolol added but has had episodes of bradycardia as well as hypotension--permissive HTN to allow for adequate perfusion.    9. Dyslipidemia: On lipitor.       Post Admission Physician Evaluation: 1. Functional deficits secondary  to bilateral cerebellar infarcts. 2. Patient is admitted to receive collaborative, interdisciplinary care between the physiatrist, rehab nursing staff, and therapy team. 3. Patient's level of medical complexity and substantial therapy needs in context of that medical necessity cannot be provided at a lesser intensity of care such as a SNF. 4. Patient has experienced substantial functional loss from his/her baseline which was documented above under the "Functional History" and "Functional Status" headings.  Judging by the patient's diagnosis, physical exam, and functional history, the patient has potential for functional progress which will result in measurable gains while on inpatient rehab.  These gains will be of substantial and practical use upon discharge  in facilitating mobility and self-care at the household level. 5. Physiatrist will provide 24 hour management of medical needs as well as oversight of the  therapy plan/treatment and provide guidance as appropriate regarding the interaction of the two. 6. 24 hour rehab nursing will assist with bladder management, bowel management, safety, skin/wound care, disease management, medication administration, pain management and patient education  and help integrate therapy concepts, techniques,education, etc. 7. PT will assess and treat for/with: Lower extremity strength, range of motion, stamina, balance, functional mobility, safety, adaptive techniques and equipment, gaze stabilization, vestibular rx, ego support, education.   Goals are: mod I to supervision. 8. OT will assess and treat for/with: ADL's, functional mobility, safety, upper extremity strength, adaptive techniques and equipment, NMR, vestibular rx, symptom mgt, community reintegration, education.   Goals are: mod I to supervision. Therapy may proceed with showering this patient. 9. SLP will assess and treat for/with: n/a.  Goals are: n/a 10. Case Management and Social Worker will assess and treat for psychological issues and discharge planning. 11. Team conference will be held weekly to assess progress toward goals and to determine barriers to discharge. 12. Patient will receive at least 3 hours of therapy per day at least 5 days per week. 13. ELOS: 7-8 days       14. Prognosis:  excellent     Meredith Staggers, MD, [facility] 05/15/2016   05/15/2016

## 2016-05-15 NOTE — H&P (Signed)
Physical Medicine and Rehabilitation Admission H&P    Chief Complaint  Patient presents with  . Dizziness with balance deficits    HPI: [name] is a 65 y.o. male in relatively good health who was admitted on 05/09 with sudden onset of dizziness, N/V and weakness. CT head negative and MRI brain aborted due to nausea and abdominal discomfort. Neurology evaluated patient and he was found to have no focal deficits except nystagmus on right lateral gaze. He has had significant vertigo was treated for dizziness. MRI brain done today showing large area of acute infarct in right cerebellar infarct involving most of PICA on right and small areas of acute infarct in left inferior cerebellum with mild mass effect on 4th ventricle. 2D echo with EF 55-60% with grade 1 diastolic dysfunction and mild MVR. Bilateral PICA infarcts felt to be embolic of unknown etiology and patient on ASA for secondary stroke prevention.   He has had complaints of BLE pain and BLE dopplers done 04/12--negative for  DVT.  Intractable N/V is resolving and he is tolerating regular diet. Will need outpatient 30 day loop monitor to rule out Afib as cause of stroke.  Vomiting has resolved with improvement in po intake and nausea now limited with certain movements.  Patient with resultant dizziness, gaze instability and balance deficits affecting mobility. CIR recommended for follow up therapy.   Review of Systems  HENT: Negative for hearing loss.   Eyes: Positive for blurred vision. Negative for double vision and photophobia.  Respiratory: Negative for cough and shortness of breath.   Cardiovascular: Negative for chest pain and palpitations.  Gastrointestinal: Positive for nausea (with head turns). Negative for heartburn and vomiting.  Genitourinary: Negative for dysuria and urgency.  Musculoskeletal: Negative for myalgias, back pain and joint pain.  Skin: Negative for itching and rash.  Neurological: Positive for  dizziness. Negative for speech change, focal weakness and headaches.  Psychiatric/Behavioral: Negative for depression and memory loss. The patient is not nervous/anxious.       Past Medical History  Diagnosis Date  . Healthy adult       History reviewed. No pertinent past surgical history.    Family History  Problem Relation Age of Onset  . Stroke Mother 51  . Diabetes Father   . 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 He quit tobacco 33 years ago. He has never used smokeless tobacco. He reports that he does not drink alcohol or use illicit drugs.    Allergies: No Known Allergies    No prescriptions prior to admission    Home: Home Living Family/patient expects to be discharged to:: Private residence Living Arrangements: Spouse/significant other Available Help at Discharge: Family Type of Home: House Home Access: Stairs to enter Technical brewer of Steps: 2 Home Layout: One level Bathroom Shower/Tub: Chiropodist: Standard Home Equipment: None   Functional History: Prior Function Level of Independence: Independent  Functional Status:  Mobility: Bed Mobility Overal bed mobility: Needs Assistance Bed Mobility: Supine to Sit, Sit to Supine Supine to sit: Supervision Sit to supine: Supervision General bed mobility comments: pt in chair Transfers Overall transfer level: Needs assistance Equipment used: None Transfers: Sit to/from Stand Sit to Stand: Min guard General transfer comment: pt unsteady upon initial standing requiring min guard to steady and v/c's to perform gaze  stabilization Ambulation/Gait Ambulation/Gait assistance: Min assist Ambulation Distance (Feet): 200 Feet Assistive device: None Gait Pattern/deviations: Step-through pattern, Decreased stride length, Narrow base of support General Gait  Details: max directional v/c's to perform gaze stabilization as pt with onset of dizziness with head turns both vertical and horizontal.  Gait velocity: decreased Gait velocity interpretation: Below normal speed for age/gender    ADL: ADL Overall ADL's : Needs assistance/impaired Eating/Feeding: Set up, Sitting Grooming: Wash/dry hands, Wash/dry face, Oral care, Minimal assistance, Standing Grooming Details (indicate cue type and reason): LOB with head turns Upper Body Bathing: Supervision/ safety, Sitting Lower Body Bathing: Minimal assistance, Sit to/from stand Lower Body Bathing Details (indicate cue type and reason): able to sit and cross bil LE. pt able to lean forward and reach socks. pt does reports dizziness with looking downa nd then up. Task that involve sitting to standing such as pulling up pants increases patients fall risk. Pt fall risk attempting to reach down in a shower static standing Upper Body Dressing : Supervision/safety Upper Body Dressing Details (indicate cue type and reason): sitting Lower Body Dressing: Minimal assistance, Sit to/from stand Toilet Transfer: Minimal assistance, Ambulation Tub/ Shower Transfer: Moderate assistance Tub/Shower Transfer Details (indicate cue type and reason): pt with LOB attempting single leg standing immediately. Pt reaching for environmental support Functional mobility during ADLs: Moderate assistance, Minimal assistance General ADL Comments: Pt attempting aspects of DGI this session and visual assessment.   Cognition: Cognition Overall Cognitive Status: Impaired/Different from baseline Orientation Level: Oriented X4 Cognition Arousal/Alertness: Awake/alert Behavior During Therapy: WFL for tasks assessed/performed Overall Cognitive Status: Impaired/Different from baseline Area of Impairment: Awareness Awareness: Anticipatory General Comments: pt just worked with OT and reports "I guess i'm not as good as I thought"   Blood  pressure 121/83, pulse 72, temperature 98 F (36.7 C), temperature source Oral, resp. rate 16, height 6' (1.829 m), weight 90.719 kg (200 lb), SpO2 100 %. Physical Exam  Nursing note and vitals reviewed. Constitutional: He is oriented to person, place, and time. He appears well-developed and well-nourished.  Was up at sink washing hands independently.   HENT:  Head: Normocephalic and atraumatic.  Eyes: Conjunctivae and EOM are normal. Pupils are equal, round, and reactive to light.  Neck: Normal range of motion. Neck supple.  Cardiovascular: Normal rate and regular rhythm.   No murmur heard. Respiratory: Effort normal and breath sounds normal. No stridor. No respiratory distress. He has no wheezes.  GI: Soft. Bowel sounds are normal. He exhibits no distension. There is no tenderness.  Musculoskeletal: Normal range of motion. He exhibits no tenderness.  Neurological: He is alert and oriented to person, place, and time.  Speech clear.  Able to follow multi-step commands without difficulty. Impulsive due to decreased safety awareness.   Right > left Horizontal nystagmus --dizzy with head turns to right. 4-5 beats nystagmus to right 0-1 on left. BUE decrease in fine motor movements and mild pronator drift although improving. Strength grossly 4/5 prox to distal in the bilateral UE. LE also 4/5. No sensory findings. Cognitively intact..   Skin: Skin is warm and dry.  Psychiatric: He has a normal mood and affect. His behavior is normal. Judgment and thought content normal.    Results for orders placed or performed during the hospital encounter of 05/08/16 (from the past 48 hour(s))  Creatinine, serum     Status: None   Collection Time: 05/15/16  6:12 AM  Result Value Ref Range   Creatinine, Ser 0.90 0.61 -   1.24 mg/dL   GFR calc non Af Amer >60 >60 mL/min   GFR calc Af Amer >60 >60 mL/min    Comment: (NOTE) The eGFR has been calculated using the CKD EPI equation. This calculation has not been  validated in all clinical situations. eGFR's persistently <60 mL/min signify possible Chronic Kidney Disease.    No results found.     Medical Problem List and Plan: 1.  Truncal ataxia/balance and functional deficits secondary to bilateral cerebellar infarcts 2.  DVT Prophylaxis/Anticoagulation: Pharmaceutical: Lovenox 3. Pain Management: N/A 4. Mood: Very motivated.  LCSW to follow for evaluation and support.  5. Neuropsych: This patient is capable of making decisions on his own behalf. Safety precautions emphasized.  6. Skin/Wound Care: Maintain adequate  Nutrition and hydration.  7. Fluids/Electrolytes/Nutrition: Monitor I/O. Check lytes in am. Offer supplements between meals prn  Poor intake.  8. Grade 1 diastolic dysfunction/Blood pressure: Metoprolol added but has had episodes of bradycardia as well as hypotension--permissive HTN to allow for adequate perfusion.    9. Dyslipidemia: On lipitor.       Post Admission Physician Evaluation: 1. Functional deficits secondary  to bilateral cerebellar infarcts. 2. Patient is admitted to receive collaborative, interdisciplinary care between the physiatrist, rehab nursing staff, and therapy team. 3. Patient's level of medical complexity and substantial therapy needs in context of that medical necessity cannot be provided at a lesser intensity of care such as a SNF. 4. Patient has experienced substantial functional loss from his/her baseline which was documented above under the "Functional History" and "Functional Status" headings.  Judging by the patient's diagnosis, physical exam, and functional history, the patient has potential for functional progress which will result in measurable gains while on inpatient rehab.  These gains will be of substantial and practical use upon discharge  in facilitating mobility and self-care at the household level. 5. Physiatrist will provide 24 hour management of medical needs as well as oversight of the  therapy plan/treatment and provide guidance as appropriate regarding the interaction of the two. 6. 24 hour rehab nursing will assist with bladder management, bowel management, safety, skin/wound care, disease management, medication administration, pain management and patient education  and help integrate therapy concepts, techniques,education, etc. 7. PT will assess and treat for/with: Lower extremity strength, range of motion, stamina, balance, functional mobility, safety, adaptive techniques and equipment, gaze stabilization, vestibular rx, ego support, education.   Goals are: mod I to supervision. 8. OT will assess and treat for/with: ADL's, functional mobility, safety, upper extremity strength, adaptive techniques and equipment, NMR, vestibular rx, symptom mgt, community reintegration, education.   Goals are: mod I to supervision. Therapy may proceed with showering this patient. 9. SLP will assess and treat for/with: n/a.  Goals are: n/a 10. Case Management and Social Worker will assess and treat for psychological issues and discharge planning. 11. Team conference will be held weekly to assess progress toward goals and to determine barriers to discharge. 12. Patient will receive at least 3 hours of therapy per day at least 5 days per week. 13. ELOS: 7-8 days       14. Prognosis:  excellent     Meredith Staggers, MD, [facility] 05/15/2016   05/15/2016

## 2016-05-15 NOTE — Interval H&P Note (Signed)
[name] was admitted today to Inpatient Rehabilitation with the diagnosis of bilateral cerebellar infarcts.  The patient's history has been reviewed, patient examined, and there is no change in status.  Patient continues to be appropriate for intensive inpatient rehabilitation.  I have reviewed the patient's chart and labs.  Questions were answered to the patient's satisfaction. The PAPE has been reviewed and assessment remains appropriate.  [name] T 05/15/2016, 7:58 PM

## 2016-05-15 NOTE — Progress Notes (Signed)
05/15/16 1710 nursing To Rehab unit per wheelchair accompanied by RN and wife alert and oriented patient. Oriented to unit set up. Fall Prevention Contract signed.

## 2016-05-15 NOTE — Progress Notes (Signed)
Tony Oyster, [name] Physician Signed Physical Medicine and Rehabilitation Consult Note 05/10/2016 3:55 PM  Related encounter: ED to Hosp-Admission (Discharged) from 05/08/2016 in [facility]    Expand All Collapse All        Physical Medicine and Rehabilitation Consult   Reason for Consult: Dizziness Referring Physician: Dr. Allena Katz   HPI: [name] is a 65 y.o. male in relatively good health who was admitted on 05/09 with sudden onset of dizziness, N/V and weakness. CT head negative and MRI brain aborted due to nausea and abdominal discomfort. Neurology evaluated patient and he was found to have no focal deficits except nystagmus on right lateral gaze. He has had significant vertigo was treated for dizziness. MRI brain done today showing large area of acute infarct in right cerebellar infarct involving most of PICA on right and small areas of acute infarct in left inferior cerebellum with mild mass effect on 4th ventricle. 2D echo with EF 55-60% with grade 1 diastolic dysfunction and mild MVR. Bilateral PICA infarcts felt to be embolic of unknown etiology and patient on ASA for secondary stroke prevention. Patient with resultant dizziness and problems with ambulation due to vestibular issues. PT evaluation done today and CIR recommended for follow up therapy.   Po intake has been limited to water due to nausea. He has been unable to sit up in bed or chair due to discomfort.   Review of Systems  Constitutional: Positive for malaise/fatigue.  HENT: Negative for hearing loss.  Eyes: Negative for blurred vision and double vision.  Respiratory: Negative for cough and sputum production.  Cardiovascular: Negative for chest pain and palpitations.  Gastrointestinal: Positive for nausea. Negative for heartburn, abdominal pain and constipation.  Genitourinary: Negative for dysuria and urgency.  Musculoskeletal: Negative for myalgias and back pain.    Neurological: Positive for dizziness and weakness. Negative for sensory change, focal weakness and headaches.  Psychiatric/Behavioral: The patient is not nervous/anxious and does not have insomnia.      History reviewed. No pertinent past medical history.    History reviewed. No pertinent past surgical history.    Family History  Problem Relation Age of Onset  . Stroke Mother 68  . Diabetes Father   . 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 He quit tobacco 33 years ago. He has never used smokeless tobacco. He reports that he does not drink alcohol or use illicit drugs.    Allergies: No Known Allergies    No prescriptions prior to admission    Home: Home Living Family/patient expects to be discharged to:: Private residence Living Arrangements: Spouse/significant other Available Help at Discharge: Family Type of Home: House Home Access: Stairs to enter Secretary/administrator of Steps: 2 Home Layout: One level Bathroom Shower/Tub: Engineer, manufacturing systems: Standard Home Equipment: None  Functional History: Prior Function Level of Independence: Independent Functional Status:  Mobility: Bed Mobility Overal bed mobility: Needs Assistance Bed Mobility: Supine to Sit, Sit to Supine Supine to sit: Supervision Sit to supine: Supervision General bed mobility comments: increased time, heavy rail use to come upright, no assist to supine Transfers Overall transfer level: Needs assistance Equipment used: Rolling walker (2 wheeled) Transfers: Sit to/from Stand Sit to Stand: Min assist General transfer comment: assist for balance/cues for focal point to use visual fixation  due to dizziness Ambulation/Gait Ambulation/Gait assistance: Min assist Ambulation Distance (Feet): 22 Feet Assistive device: Rolling walker (2  wheeled) Gait Pattern/deviations: Step-through pattern, Trunk flexed, Decreased stride length General Gait Details: cues again for focal point, but pt rushing due to dizziness; assisted back to EOB and cues not to lie down rapidly and use focal target to help diminish symptoms.    ADL:    Cognition: Cognition Overall Cognitive Status: No family/caregiver present to determine baseline cognitive functioning (thought at [employer] and was 5/10; was asleep earlier) Orientation Level: Oriented X4 Cognition Arousal/Alertness: Awake/alert Behavior During Therapy: Flat affect Overall Cognitive Status: No family/caregiver present to determine baseline cognitive functioning (thought at [facility] and was 5/10; was asleep earlier)   Blood pressure 136/72, pulse 71, temperature 99.3 F (37.4 C), temperature source Oral, resp. rate 18, height 6' (1.829 m), weight 90.719 kg (200 lb), SpO2 96 %. Physical Exam  Nursing note and vitals reviewed. Constitutional: He is oriented to person, place, and time. He appears well-developed and well-nourished.  HENT:  Head: Normocephalic and atraumatic.  Eyes: Conjunctivae are normal. Pupils are equal, round, and reactive to light.  Neck: Normal range of motion. Neck supple.  Cardiovascular: Normal rate and regular rhythm.  No murmur heard. Respiratory: Effort normal and breath sounds normal. He has no wheezes.  GI: Soft. Bowel sounds are normal. He exhibits no distension. There is no tenderness.  Musculoskeletal: He exhibits no edema or tenderness.  Neurological: He is alert and oriented to person, place, and time. He displays normal reflexes. He exhibits normal muscle tone.  Tended to keep eyes closed. Able to follow basic commands without difficulty. No gross limb ataxia. Becomes nauseas with confrontation. 1-2 beats of nystagmus when tracking to right. Strength 4+ RUE and 4 LUE. LE: 4-/5 prox to 4/5 distally..  Skin: Skin is warm and dry.    Psychiatric: He has a normal mood and affect. His behavior is normal. Judgment and thought content normal.     Lab Results Last 24 Hours    No results found for this or any previous visit (from the past 24 hour(s)).    Imaging Results (Last 48 hours)    Ct Head Wo Contrast  05/08/2016 CLINICAL DATA: Sudden onset dizziness, vomiting and bilateral upper extremity weakness. EXAM: CT HEAD WITHOUT CONTRAST TECHNIQUE: Contiguous axial images were obtained from the base of the skull through the vertex without intravenous contrast. COMPARISON: None. FINDINGS: There is some limitation due to patient motion and streak artifacts, despite repeat imaging. Diffusely enlarged ventricles and subarachnoid spaces. No intracranial hemorrhage, mass lesion or CT evidence of acute infarction. Unremarkable bones and included paranasal sinuses. IMPRESSION: Mildly limited examination demonstrating mild atrophy with no acute abnormality. These results were called by telephone at the time of interpretation on 05/08/2016 at 6:09 pm to [name], the nurse working with Tony Bouchard, PA-C , who verbally acknowledged these results. Electronically Signed By: Beckie Salts M.D. On: 05/08/2016 18:11   Mr Brain Wo Contrast  05/10/2016 CLINICAL DATA: Severe dizziness with nausea and vomiting. Benign positional vertigo is suspected. EXAM: MRI HEAD WITHOUT CONTRAST TECHNIQUE: Multiplanar, multiecho pulse sequences of the brain and surrounding structures were obtained without intravenous contrast. COMPARISON: Motion degraded CT head 05/08/2016. FINDINGS: The study had to be terminated prematurely as the patient required removal for severe dizziness with nausea and vomiting. There is a large area of acute infarction affecting the RIGHT inferior cerebellum. This involves most of the territory of the posterior inferior cerebellar  artery on the RIGHT. Smaller areas of acute infarction affect the inferior cerebellum on the LEFT, medial  PICA territory. Within limits for detection on MR, no hemorrhage. Cytotoxic edema is developing in the posterior fossa, the RIGHT tonsil is swollen, and there is mild mass effect on the dorsolateral fourth ventricle. Mild cerebral and cerebellar atrophy. Mild subcortical and periventricular T2 and FLAIR hyperintensities, likely chronic microvascular ischemic change. Flow voids are maintained in the BILATERAL vertebral arteries and basilar artery; LEFT vertebral is slightly larger. Neither demonstrate signs of dissection or occlusion. No midline pituitary abnormality. Cerebellar tonsils are at the level of the foramen magnum without impaction at this time, although the RIGHT is lower. IMPRESSION: Large acute RIGHT cerebellar infarct, nonhemorrhagic, PICA territory. Much smaller infarct of the LEFT medial inferior cerebellum, also PICA territory. Mild mass effect on the fourth ventricle. The patient should be observed for signs of developing obstructive hydrocephalus. A call is in to the care team. Electronically Signed By: Elsie StainJohn T Curnes M.D. On: 05/10/2016 09:29     Assessment/Plan: Diagnosis: Right>left cerebellar infarcts 1. Does the need for close, 24 hr/day medical supervision in concert with the patient's rehab needs make it unreasonable for this patient to be served in a less intensive setting? Yes 2. Co-Morbidities requiring supervision/potential complications: post-stroke sequelae 3. Due to bladder management, bowel management, safety, skin/wound care, disease management, medication administration, pain management and patient education, does the patient require 24 hr/day rehab nursing? Yes 4. Does the patient require coordinated care of a physician, rehab nurse, PT (1-2 hrs/day, 5 days/week) and OT (1-2 hrs/day, 5 days/week) to address physical and functional deficits in the context of the above medical diagnosis(es)? Yes Addressing deficits in the following areas: balance, endurance,  locomotion, strength, transferring, bowel/bladder control, bathing, dressing, feeding, grooming, toileting and psychosocial support 5. Can the patient actively participate in an intensive therapy program of at least 3 hrs of therapy per day at least 5 days per week? Yes 6. The potential for patient to make measurable gains while on inpatient rehab is excellent 7. Anticipated functional outcomes upon discharge from inpatient rehab are modified independent with PT, modified independent with OT, n/a with SLP. 8. Estimated rehab length of stay to reach the above functional goals is: 7-9 days 9. Does the patient have adequate social supports and living environment to accommodate these discharge functional goals? Yes 10. Anticipated D/C setting: Home 11. Anticipated post D/C treatments: HH therapy and Outpatient therapy 12. Overall Rehab/Functional Prognosis: excellent  RECOMMENDATIONS: This patient's condition is appropriate for continued rehabilitative care in the following setting: CIR Patient has agreed to participate in recommended program. Yes Note that insurance prior authorization may be required for reimbursement for recommended care.  Comment: Rehab Admissions Coordinator to follow up. Pt was very active prior to admission and motivated to rehab from these infarcts  Thanks,  Tony OysterZachary T. Swartz, [name], [name]     05/10/2016       Revision History     Date/Time User Provider Type Action   05/11/2016 10:46 AM Tony OysterZachary T Swartz, [name] Physician Sign   05/10/2016 4:38 PM Jacquelynn CreePamela S Love, PA-C Physician Assistant [place]   View Details Report       Routing History     Date/Time From To Method   05/11/2016 10:46 AM Tony OysterZachary T Swartz, [name] Catha GosselinKevin Little, [name] Fax

## 2016-05-15 NOTE — Evaluation (Signed)
Occupational Therapy Evaluation Patient Details Name: [name] MRN: [id number] DOB: 1951-10-01 Today's Date: 05/15/2016    History of Present Illness [name] is an 65 y.o. male with no previously documented medical history abnormal medications who was brought to the emergency room and code stroke status following acute onset of vertigo and nausea as well as feeling of weakness involving upper extremities primarily.  MRI positive for Large acute R cerebellar infarct PICA terriotory and smaller L medial inferior cerebellar infarct also PICA terriotory.   Clinical Impression   Pt could greatly benefit from a CIR admission to address balance with all adls. Pt demonstrates LOB with head turns and single leg standing. Pt with great risk for falling with tub transfer, change of surface ( such as thresholds) and dressing LOB if not in a seated position. Pt unable to static stand with eyes closed which would greatly impact ability to static stand in a shower. Pt demonstrates visual changes as noted below. Pt describes an a very short history of attempting to get corrective glasses and having glasses adjusted multiple times recently. Pt with glasses don during session and demonstrates LOB.     Follow Up Recommendations  CIR;Supervision - Intermittent    Equipment Recommendations  Other (comment) (CIR denies will need a shower seat for TUB)    Recommendations for Other Services Rehab consult     Precautions / Restrictions Precautions Precautions: Fall      Mobility Bed Mobility               General bed mobility comments: in chair on arrival  Transfers Overall transfer level: Needs assistance Equipment used: 1 person hand held assist Transfers: Sit to/from Stand Sit to Stand: Min guard         General transfer comment: pt able to power up from chair. pt with no initial dizziness with task    Balance Overall balance assessment: Needs assistance         Standing  balance support: No upper extremity supported;During functional activity Standing balance-Leahy Scale: Fair Standing balance comment: pt with LOB in a dynamic environment. pt able to maintain balance better in a static environment.              High level balance activites: Side stepping;Backward walking;Direction changes;Turns;Sudden stops;Head turns High Level Balance Comments: LOB with stepping onto single leg and with head turns Standardized Balance Assessment Standardized Balance Assessment : Dynamic Gait Index   Dynamic Gait Index Level Surface: Mild Impairment Change in Gait Speed: Mild Impairment Gait with Horizontal Head Turns: Moderate Impairment      ADL Overall ADL's : Needs assistance/impaired Eating/Feeding: Set up;Sitting   Grooming: Wash/dry hands;Wash/dry face;Oral care;Minimal assistance;Standing Grooming Details (indicate cue type and reason): LOB with head turns Upper Body Bathing: Supervision/ safety;Sitting   Lower Body Bathing: Minimal assistance;Sit to/from stand Lower Body Bathing Details (indicate cue type and reason): able to sit and cross bil LE. pt able to lean forward and reach socks. pt does reports dizziness with looking downa nd then up. Task that involve sitting to standing such as pulling up pants increases patients fall risk. Pt fall risk attempting to reach down in a shower static standing Upper Body Dressing : Supervision/safety Upper Body Dressing Details (indicate cue type and reason): sitting Lower Body Dressing: Minimal assistance;Sit to/from stand   Toilet Transfer: Minimal assistance;Ambulation       Tub/ Shower Transfer: Moderate assistance Tub/Shower Transfer Details (indicate cue type and reason): pt with LOB attempting  single leg standing immediately. Pt reaching for environmental support Functional mobility during ADLs: Moderate assistance;Minimal assistance General ADL Comments: Pt attempting aspects of DGI this session and  visual assessment.      Vision Vision Assessment?: Yes Eye Alignment: Within Functional Limits Ocular Range of Motion: Restricted on the right;Restricted looking up Alignment/Gaze Preference: Head turned;Head tilt Tracking/Visual Pursuits: Impaired - to be further tested in functional context;Requires cues, head turns, or add eye shifts to track;Decreased smoothness of eye movement to RIGHT superior field Additional Comments: Pt provided 13 circles random pattern on paper and asked to number the circles as pt sees them. pt demonstrates a disorganized random search pattern thus visual scanning deficits. Pt demonstrates poor return demo drawing a clock. pt with numbers clustered in the center upper quadrant of clock and bottom left quadrant. pt tilting head to scan clock for errors and needed to redraw number 11 to place in a different location. pt set the time correct with corect hand placment on the clock Pt states "my numbers are wrong now that I look at it again" Pt with visual testing shows deficit in R upper quadrant. pt with L eye occluded and ocolomotor assessment of R eye. pt states "its nearly gone ON the R side. Pt with delayed reaction to visual peripheral input on R upper quadrant with testing.      05/15/16 1200  Vision- Assessment  Vision Assessment? Yes  Eye Alignment [facility]  Ocular Range of Motion Restricted on the right;Restricted looking up  Alignment/Gaze Preference Head turned;Head tilt  Tracking/Visual Pursuits Impaired - to be further tested in functional context;Requires cues, head turns, or add eye shifts to track;Decreased smoothness of eye movement to RIGHT superior field  Additional Comments Pt provided 13 circles random pattern on paper and asked to number the circles as pt sees them. pt demonstrates a disorganized random search pattern thus visual scanning deficits. Pt demonstrates poor return demo drawing a clock. pt with numbers clustered in the center upper quadrant of  clock and bottom left quadrant. pt tilting head to scan clock for errors and needed to redraw number 11 to place in a different location. pt set the time correct with corect hand placment on the clock Pt states "my numbers are wrong now that I look at it again" Pt with visual testing shows deficit in R upper quadrant. pt with L eye occluded and ocolomotor assessment of R eye. pt states "its nearly gone ON the R side. Pt with delayed reaction to visual peripheral input on R upper quadrant with testing.     Perception     Praxis      Pertinent Vitals/Pain Pain Assessment:  (dizziness)     Hand Dominance Right   Extremity/Trunk Assessment Upper Extremity Assessment Upper Extremity Assessment: Generalized weakness   Lower Extremity Assessment Lower Extremity Assessment: Defer to PT evaluation RLE Deficits / Details: reports feeling weaker   Cervical / Trunk Assessment Cervical / Trunk Assessment: Normal   Communication Communication Communication: No difficulties   Cognition Arousal/Alertness: Awake/alert Behavior During Therapy: WFL for tasks assessed/performed Overall Cognitive Status: Impaired/Different from baseline Area of Impairment: Awareness           Awareness: Anticipatory   General Comments: Pt unaware of balance deficits and risk for falls. Pt very stoic look after LOB with movement. Pt states "i thought I was doing better than I really am"   General Comments       Exercises  Shoulder Instructions      Home Living Family/patient expects to be discharged to:: Private residence Living Arrangements: Spouse/significant other Available Help at Discharge: Family Type of Home: House Home Access: Stairs to enter Secretary/administratorntrance Stairs-Number of Steps: 2   Home Layout: One level     Bathroom Shower/Tub: Chief Strategy OfficerTub/shower unit   Bathroom Toilet: Standard     Home Equipment: None          Prior Functioning/Environment Level of Independence: Independent              OT Diagnosis: Generalized weakness;Disturbance of vision   OT Problem List: Decreased strength;Decreased activity tolerance;Impaired balance (sitting and/or standing);Decreased cognition;Decreased safety awareness;Decreased knowledge of use of DME or AE;Impaired vision/perception   OT Treatment/Interventions: Self-care/ADL training;Therapeutic exercise;DME and/or AE instruction;Therapeutic activities;Visual/perceptual remediation/compensation;Cognitive remediation/compensation;Patient/family education;Balance training    OT Goals(Current goals can be found in the care plan section) Acute Rehab OT Goals Patient Stated Goal: to get back to normal OT Goal Formulation: With patient/family Time For Goal Achievement: 05/29/16 Potential to Achieve Goals: Good  OT Frequency: Min 3X/week   Barriers to D/C:            Co-evaluation              End of Session Equipment Utilized During Treatment: Gait belt Nurse Communication: Mobility status;Precautions  Activity Tolerance: Patient tolerated treatment well Patient left: in chair;with call bell/phone within reach;with family/visitor present   Time: 1120-1201 OT Time Calculation (min): 41 min Charges:  OT Evaluation $OT Eval Moderate Complexity: 1 Procedure OT Treatments $Self Care/Home Management : 8-22 mins $Therapeutic Activity: 8-22 mins G-Codes:    Boone MasterJones, Lamees Gable B 05/15/2016, 12:34 PM  Mateo FlowJones, Brynn   OTR/L Pager: [phone]: [phone] Office: [phone] .

## 2016-05-15 NOTE — Progress Notes (Signed)
CSW was following for SNF back up to CIR.  PT note from yesterday saying pt walking 36900ft min assist- [employer] will not approve SNF stay at this level of mobility   CSW informed RNCM who will follow up regarding home health  CSW signing off.  Merlyn LotJenna Holoman, LCSWA Clinical Social Worker [phone]

## 2016-05-15 NOTE — Progress Notes (Signed)
Inpatient Rehabilitation  I received insurance approval to admit pt. To CIR.  Dr. Allena KatzPatel has cleared pt. Medically, and I have updated UnumProvidentJenna Holoman, CSW and Fabio NeighborsKelli Willard, [place]CM via text message.  Pt. And wife are delighted.  Please call if questions.  Weldon PickingSusan Eliceo Gladu PT Inpatient Rehab Admissions Coordinator Cell [phone] Office [phone]

## 2016-05-16 ENCOUNTER — Inpatient Hospital Stay (HOSPITAL_COMMUNITY): Payer: BC Managed Care – PPO | Admitting: Physical Therapy

## 2016-05-16 ENCOUNTER — Inpatient Hospital Stay (HOSPITAL_COMMUNITY): Payer: BC Managed Care – PPO | Admitting: Occupational Therapy

## 2016-05-16 DIAGNOSIS — R110 Nausea: Secondary | ICD-10-CM | POA: Diagnosis present

## 2016-05-16 LAB — COMPREHENSIVE METABOLIC PANEL
ALT: 28 U/L (ref 17–63)
AST: 24 U/L (ref 15–41)
Albumin: 3.1 g/dL — ABNORMAL LOW (ref 3.5–5.0)
Alkaline Phosphatase: 67 U/L (ref 38–126)
Anion gap: 9 (ref 5–15)
BUN: 15 mg/dL (ref 6–20)
CO2: 26 mmol/L (ref 22–32)
Calcium: 8.9 mg/dL (ref 8.9–10.3)
Chloride: 107 mmol/L (ref 101–111)
Creatinine, Ser: 0.98 mg/dL (ref 0.61–1.24)
GFR calc Af Amer: >60 mL/min (ref >60)
GFR calc non Af Amer: >60 mL/min (ref >60)
Glucose, Bld: 88 mg/dL (ref 65–99)
Potassium: 3.9 mmol/L (ref 3.5–5.1)
Sodium: 142 mmol/L (ref 135–145)
Total Bilirubin: 0.7 mg/dL (ref 0.3–1.2)
Total Protein: 6 g/dL — ABNORMAL LOW (ref 6.5–8.1)

## 2016-05-16 LAB — CBC WITH DIFFERENTIAL/PLATELET
Basophils Absolute: 0 K/uL (ref 0.0–0.1)
Basophils Relative: 0 %
Eosinophils Absolute: 0.3 K/uL (ref 0.0–0.7)
Eosinophils Relative: 4 %
HCT: 45.9 % (ref 39.0–52.0)
Hemoglobin: 15.6 g/dL (ref 13.0–17.0)
Lymphocytes Relative: 28 %
Lymphs Abs: 2.1 K/uL (ref 0.7–4.0)
MCH: 32.6 pg (ref 26.0–34.0)
MCHC: 34 g/dL (ref 30.0–36.0)
MCV: 95.8 fL (ref 78.0–100.0)
Monocytes Absolute: 1 K/uL (ref 0.1–1.0)
Monocytes Relative: 12 %
Neutro Abs: 4.3 K/uL (ref 1.7–7.7)
Neutrophils Relative %: 56 %
Platelets: 205 K/uL (ref 150–400)
RBC: 4.79 MIL/uL (ref 4.22–5.81)
RDW: 12.8 % (ref 11.5–15.5)
WBC: 7.7 K/uL (ref 4.0–10.5)

## 2016-05-16 NOTE — Progress Notes (Addendum)
Physical Therapy Session Note  Patient Details  Name: [name] MRN: [id number] Date of Birth: 04/27/1951  Today's Date: 05/16/2016 PT Individual Time: 1130-1200 PT Individual Time Calculation (min): 30 min   Short Term Goals: Week 1:  PT Short Term Goal 1 (Week 1): =LTG due to estimated LOS  Skilled Therapeutic Interventions/Progress Updates:  Pt received in bed; pt agreeable to participate in vestibular evaluation.  1. History and Physical Examination    Pt presents s/p cerebellar CVA with constant vertigo (sensation of spinning) 3/10 as baseline with improvement in vertigo with eyes closed.  Pt denies changes in vision or hearing.  Pt reports vomiting at onset of CVA but no vomiting since admission but pt does report intermittent nausea.  At end of session pt returned to supine due to increase in nausea symptoms; wife present.  Discussed findings with pt and wife and recommendations with pt, wife and primary OT.       Antivertiginous Medications:  Antivert PRN  2. Vestibular Assessment                           Gross neck ROM WFL but pt guarding neck/head movement  Eye Alignment Resting mild L gaze preference, L eye slightly ABD  Oculomotor ROM Range WFL   Spontaneous  Nystagmus (room light and vision occluded) None seen in room light  Gaze holding nystagmus(room light and vision occluded) Mild  Smooth pursuit Not smooth; L eye hopping with gaze holding to L  Saccades Delayed, symptomatic for vertigo and nausea  Vergence WFL  VOR Cancellation Limited assessment due to neck guarding, symptomatic  Pressure Tests (vision occluded) N/T  VOR slow Limited assessment due to neck guarding, symptomatic  Head Thrust Test Limited assessment due to neck guarding but appears negative  Head Shaking Test (vision occluded) N/T  Dynamic Visual Acuity         N/T  Rt. Hallpike Dix Part of MSQ 2-3/10  Lt. Hallpike Dix Part of MSQ 2-3/10  Rt. Roll Test Part of MSQ 2/10  Lt. Roll Test  Part  of MSQ 2/10  MSQ Pt sensitive to all movements; Supine <> sit 3/10, head tipped to L and R knee 2/10, head turns 5/10, head pitches 6/10, 180 deg turns to L and R 4/10.  Cover-Cross Cover (if indicated) WFL  Head-Neck Differentiation Test (if indicated) Rotation and Flex/Ext   3. Assessment of Gait and Balance(static and dynamic): Performed by primary PT; See balance below for reference   4. Findings:  Patient signs and symptoms consistent with significant motion sensitivity, central vertigo, and impaired visual-vestibular interactions.  5. Recommendations for Treatment: Initiate use of compensatory saccades and visual targeting for bed mobility, transfers, gait (eye shift, head shift and then body movement in that direction) for safety with transfers with wife at D/C; continue with therapy Habituation to all movements and inclusion of vertical and horizontal head movements into transfers, dynamic standing balance and gait but keep symptoms <3/10; Adaptation-VOR x 1 viewing in supported sitting with target at 883ft >> 706ft against blank background and work towards performing vertical and horizontal head turns x 30 seconds each 2-3 reps, 2-3x/day.  Therapy Documentation Precautions:  Precautions Precautions: Fall Precaution Comments: vertigo Restrictions Weight Bearing Restrictions: No General: Chart Reviewed: Yes Response to Previous Treatment: Patient reporting fatigue but able to participate. Family/Caregiver Present: Yes (wife present initially, left at beginning of session) Vital Signs: Therapy Vitals Temp: 98.8 F (37.1 C) Temp  Source: Oral Pulse Rate: 65 Resp: 18 BP: 117/84 mmHg Patient Position (if appropriate): Standing Oxygen Therapy SpO2: 98 % O2 Device: Not Delivered Pain: Pain Assessment Pain Assessment: No/denies pain Balance: Balance assessments performed by primary PT Balance Assessed: Yes Standardized Balance Assessment Standardized Balance Assessment: Berg  Balance Test;Dynamic Gait Index;Timed Up and Go Test Berg Balance Test Sit to Stand: Able to stand without using hands and stabilize independently Standing Unsupported: Able to stand safely 2 minutes Sitting with Back Unsupported but Feet Supported on Floor or Stool: Able to sit safely and securely 2 minutes Stand to Sit: Sits safely with minimal use of hands Transfers: Able to transfer safely, minor use of hands Standing Unsupported with Eyes Closed: Able to stand 10 seconds with supervision Standing Ubsupported with Feet Together: Able to place feet together independently and stand for 1 minute with supervision From Standing, Reach Forward with Outstretched Arm: Can reach forward >12 cm safely (5") From Standing Position, Pick up Object from Floor: Able to pick up shoe, needs supervision From Standing Position, Turn to Look Behind Over each Shoulder: Looks behind from both sides and weight shifts well Turn 360 Degrees: Needs close supervision or verbal cueing Standing Unsupported, Alternately Place Feet on Step/Stool: Needs assistance to keep from falling or unable to try Standing Unsupported, One Foot in Front: Able to plae foot ahead of the other independently and hold 30 seconds Standing on One Leg: Tries to lift leg/unable to hold 3 seconds but remains standing independently Total Score: 41  Dynamic Gait Index Level Surface: Mild Impairment Change in Gait Speed: Mild Impairment Gait with Horizontal Head Turns: Mild Impairment Gait with Vertical Head Turns: Mild Impairment Gait and Pivot Turn: Moderate Impairment Step Over Obstacle: Moderate Impairment Step Around Obstacles: Mild Impairment Steps: Mild Impairment Total Score: 14  Timed Up and Go Test TUG: Normal TUG;Cognitive TUG Normal TUG (seconds): 15 Cognitive TUG (seconds): 19   See Function Navigator for Current Functional Status.   Therapy/Group: Individual Therapy  [facility] 05/16/2016, 4:24 PM

## 2016-05-16 NOTE — Progress Notes (Signed)
Patient information reviewed and entered into eRehab system by Bethzy Hauck, RN, CRRN, PPS Coordinator.  Information including medical coding and functional independence measure will be reviewed and updated through discharge.     Per nursing patient was given "Data Collection Information Summary for Patients in Inpatient Rehabilitation Facilities with attached "Privacy Act Statement-Health Care Records" upon admission.  

## 2016-05-16 NOTE — Progress Notes (Signed)
65 y.o. male in relatively good health who was admitted on 05/09 with sudden onset of dizziness, N/V and weakness. CT head negative and MRI brain aborted due to nausea and abdominal discomfort. Neurology evaluated patient and he was found to have no focal deficits except nystagmus on right lateral gaze. He has had significant vertigo was treated for dizziness. MRI brain done today showing large area of acute infarct in right cerebellar infarct involving most of PICA on right and small areas of acute infarct in left inferior cerebellum with mild mass effect on 4th ventricle. 2D echo with EF 55-60% with grade 1 diastolic dysfunction and mild MVR. Bilateral PICA infarcts felt to be embolic of unknown etiology and patient on ASA for secondary stroke prevention.   Subjective/Complaints: Patient had a dizzy spell this morning. He was in a chair. Transferred back to bed with resolution of symptoms. No dizziness with sitting at edge of bed currently.  Review of systems negative for chest pain, shortness of breath, nausea, vomiting, diarrhea, constipation  Objective: Vital Signs: Blood pressure 124/70, pulse 77, temperature 98.4 F (36.9 C), temperature source Oral, resp. rate 20, height _0  (1.854 m), weight 89.359 kg (197 lb), SpO2 98 %. No results found. Results for orders placed or performed during the hospital encounter of 05/15/16 (from the past 72 hour(s))  CBC WITH DIFFERENTIAL     Status: None   Collection Time: 05/16/16  4:30 AM  Result Value Ref Range   WBC 7.7 4.0 - 10.5 K/uL   RBC 4.79 4.22 - 5.81 MIL/uL   Hemoglobin 15.6 13.0 - 17.0 g/dL   HCT 45.9 39.0 - 52.0 %   MCV 95.8 78.0 - 100.0 fL   MCH 32.6 26.0 - 34.0 pg   MCHC 34.0 30.0 - 36.0 g/dL   RDW 12.8 11.5 - 15.5 %   Platelets 205 150 - 400 K/uL   Neutrophils Relative % 56 %   Neutro Abs 4.3 1.7 - 7.7 K/uL   Lymphocytes Relative 28 %   Lymphs Abs 2.1 0.7 - 4.0 K/uL   Monocytes Relative 12 %   Monocytes Absolute 1.0 0.1 - 1.0  K/uL   Eosinophils Relative 4 %   Eosinophils Absolute 0.3 0.0 - 0.7 K/uL   Basophils Relative 0 %   Basophils Absolute 0.0 0.0 - 0.1 K/uL  Comprehensive metabolic panel     Status: Abnormal   Collection Time: 05/16/16  4:30 AM  Result Value Ref Range   Sodium 142 135 - 145 mmol/L   Potassium 3.9 3.5 - 5.1 mmol/L   Chloride 107 101 - 111 mmol/L   CO2 26 22 - 32 mmol/L   Glucose, Bld 88 65 - 99 mg/dL   BUN 15 6 - 20 mg/dL   Creatinine, Ser 0.98 0.61 - 1.24 mg/dL   Calcium 8.9 8.9 - 10.3 mg/dL   Total Protein 6.0 (L) 6.5 - 8.1 g/dL   Albumin 3.1 (L) 3.5 - 5.0 g/dL   AST 24 15 - 41 U/L   ALT 28 17 - 63 U/L   Alkaline Phosphatase 67 38 - 126 U/L   Total Bilirubin 0.7 0.3 - 1.2 mg/dL   GFR calc non Af Amer >60 >60 mL/min   GFR calc Af Amer >60 >60 mL/min    Comment: (NOTE) The eGFR has been calculated using the CKD EPI equation. This calculation has not been validated in all clinical situations. eGFR's persistently <60 mL/min signify possible Chronic Kidney Disease.    Anion gap  9 5 - 15     HEENT: normal Cardio: RRR and no murmur Resp: CTA B/L and unlabored GI: BS positive and NT, ND Extremity:  No Edema Skin:   Intact Neuro: Alert/Oriented, Normal Sensory , Abnormal FMC Ataxic/ dec FMC and Dysarthric Musc/Skel:  Normal Gen NAD Ambulates with standby assistance widened base of support.  Assessment/Plan: 1. Functional deficits secondary to Trunkal ataxia bilateral cerebellar which require 3+ hours per day of interdisciplinary therapy in a comprehensive inpatient rehab setting. Physiatrist is providing close team supervision and 24 hour management of active medical problems listed below. Physiatrist and rehab team continue to assess barriers to discharge/monitor patient progress toward functional and medical goals. FIM:                   Function - Comprehension Comprehension: Auditory Comprehension assist level: Follows basic conversation/direction with extra  time/assistive device  Function - Expression Expression: Verbal Expression assist level: Expresses complex ideas: With no assist  Function - Social Interaction Social Interaction assist level: Interacts appropriately with others - No medications needed.  Function - Problem Solving Problem solving assist level: Solves complex problems: Recognizes & self-corrects  Function - Memory Memory assist level: Complete Independence: No helper Patient normally able to recall (first 3 days only): That he or she is in a hospital, Current season, Location of own room, Staff names and faces   Medical Problem List and Plan: 1.  Truncal ataxia/balance and functional deficits secondary to bilateral cerebellar infarcts- initiate rehab evals today 2.  DVT Prophylaxis/Anticoagulation: Pharmaceutical: Lovenox 3. Pain Management: N/A 4. Mood: Very motivated.  LCSW to follow for evaluation and support.   5. Neuropsych: This patient is capable of making decisions on his own behalf. Safety precautions emphasized.   6. Skin/Wound Care: Maintain adequate  Nutrition and hydration.   7. Fluids/Electrolytes/Nutrition: Monitor I/O. Check lytes in am. Offer supplements between meals prn  Poor intake.   8. Grade 1 diastolic dysfunction/Blood pressure: Metoprolol added but has had episodes of bradycardia as well as hypotension--permissive HTN to allow for adequate perfusion.    Patient had dizzy spell this morning which may be a blood pressure related issue. It resolved when he got back into bed. We'll check orthostatic vitals. 9. Dyslipidemia: On lipitor.  Labs personally reviewed, normal LOS (Days) 1 A FACE TO FACE EVALUATION WAS PERFORMED  Madisan Bice E 05/16/2016, 8:18 AM

## 2016-05-16 NOTE — Progress Notes (Signed)
Occupational Therapy Session Note  Patient Details  Name: [name] MRN: [id number] Date of Birth: 08/31/1951  Today's Date: 05/16/2016 OT Individual Time: 1435-1505 OT Individual Time Calculation (min): 30 min    Short Term Goals: Week 1:  OT Short Term Goal 1 (Week 1): STGs = LTGs  Skilled Therapeutic Interventions/Progress Updates:    Treatment session with focus on management of dizziness during functional mobility.  Pt having received VOR x1 exercises from PT prior to OT session.  Pt reports no further questions about completing those exercises, but asking questions about recovery.  Ambulated to therapy Dayroom with min guard and no LOB.  Increased challenge to incorporating head turns during ambulation to read signs on wall with min assist for balance, discussing carryover into functional everyday tasks.  Pt reports tolerating movements at his selected speed.  Returned to room and reiterated use of VOR x1 exercises during his down time.  Also begun to explain vestibular system and dizziness and balance deficits as pt continues to have questions.  Therapy Documentation Precautions:  Precautions Precautions: Fall Precaution Comments: vertigo Restrictions Weight Bearing Restrictions: No General:   Vital Signs: Therapy Vitals Temp: 98.8 F (37.1 C) Temp Source: Oral Pulse Rate: 65 Resp: 18 BP: 117/84 mmHg Patient Position (if appropriate): Standing Oxygen Therapy SpO2: 98 % O2 Device: Not Delivered Pain:  Pt with no c/o pain  See Function Navigator for Current Functional Status.   Therapy/Group: Individual Therapy  [name], Amie Cowens 05/16/2016, 3:28 PM

## 2016-05-16 NOTE — Telephone Encounter (Signed)
He can do rehab with the monitor.

## 2016-05-16 NOTE — Evaluation (Signed)
Occupational Therapy Assessment and Plan  Patient Details  Name: [name] MRN: [id number] Date of Birth: 11/04/1951  OT Diagnosis: disturbance of vision and muscle weakness (generalized) Rehab Potential: Rehab Potential (ACUTE ONLY): Excellent ELOS: 3-5 days   Today's Date: 05/16/2016 OT Individual Time: 4967-5916 OT Individual Time Calculation (min): 75 min     Problem List:  Patient Active Problem List   Diagnosis Date Noted  . Nausea without vomiting 05/16/2016  . Cerebellar stroke ([place]) 05/15/2016  . Acute right PCA stroke ([place]) 05/10/2016  . Acute CVA (cerebrovascular accident) ([place]) 05/10/2016  . Emesis   . Vertigo 05/08/2016  . Intractable nausea and vomiting 05/08/2016    Past Medical History:  Past Medical History  Diagnosis Date  . Healthy adult    Past Surgical History: History reviewed. No pertinent past surgical history.  Assessment & Plan Clinical Impression:  [name] is a 65 y.o. male in relatively good health who was admitted on 05/09 with sudden onset of dizziness, N/V and weakness. CT head negative and MRI brain aborted due to nausea and abdominal discomfort.  Neurology evaluated patient and he was found to have no focal deficits except nystagmus on right lateral gaze. He has had significant vertigo was treated for dizziness.  MRI brain done today showing large area of acute infarct in right cerebellar infarct involving most of PICA on right and small areas of acute infarct in left inferior cerebellum with mild mass effect on 4th ventricle. 2D echo with EF 55-60% with grade 1 diastolic dysfunction and mild MVR.  Bilateral PICA infarcts felt to be embolic of unknown etiology and patient on ASA for secondary stroke prevention.   He has had complaints of BLE pain and BLE dopplers done 04/12--negative for  DVT.  Intractable N/V is resolving and he is tolerating regular diet. Will need outpatient 30 day loop monitor to rule out Afib as cause of stroke.   Vomiting has resolved with improvement in po intake and nausea now limited with certain movements.  Patient with resultant dizziness, gaze instability and balance deficits affecting mobility. CIR recommended for follow up therapy.   Patient transferred to CIR on 05/15/2016 .    Patient currently requires supervision with basic self-care skills secondary to decreased cardiorespiratoy endurance, decreased visual motor skills and central origin.  Prior to hospitalization, patient was fully I, active, drove a school bus.   Patient will benefit from skilled intervention to increase independence with basic self-care skills and increase level of independence with iADL prior to discharge home with care partner.  Anticipate patient will require intermittent supervision and follow up outpt vestibular rehab.  OT - End of Session Activity Tolerance: Tolerates 30+ min activity with multiple rests OT Assessment Rehab Potential (ACUTE ONLY): Excellent OT Patient demonstrates impairments in the following area(s): Balance;Endurance;Vision OT Basic ADL's Functional Problem(s): Bathing;Dressing;Toileting OT Advanced ADL's Functional Problem(s): Light Housekeeping OT Transfers Functional Problem(s): Toilet;Tub/Shower OT Additional Impairment(s): None OT Plan OT Intensity: Minimum of 1-2 x/day, 45 to 90 minutes OT Frequency: 5 out of 7 days OT Duration/Estimated Length of Stay: 3-5 days OT Treatment/Interventions: Balance/vestibular training;Visual/perceptual remediation/compensation;Discharge planning;Functional mobility training;Self Care/advanced ADL retraining;Therapeutic Activities;Therapeutic Exercise OT Self Feeding Anticipated Outcome(s): I OT Basic Self-Care Anticipated Outcome(s): mod I OT Toileting Anticipated Outcome(s): mod I OT Bathroom Transfers Anticipated Outcome(s): mod I to toilet, S to bathtub OT Recommendation Patient destination: Home Follow Up Recommendations: Outpatient vestibular  rehab Equipment Recommended: Tub/shower seat   Skilled Therapeutic Intervention Pt seen for initial evaluation and  ADL retraining to include shower, dressing, grooming. Pt did have 4-5/10 dizziness at start of session and cued to use visual fixation with sit to stand and walking with RW. Pt tolerated movement well without increased dizziness.  Pt discussed his discharge plans with his wife staying home, he also vocalized his desire to have a short LOS.  Pt worked on visual exercises of letter cancellation, clock drawing, picture copying without difficulty.  Pt demonstrated fair standing balance relying on support from RW.  Pt resting in bed with all needs met.   OT Evaluation Precautions/Restrictions  Precautions Precautions: Fall Precaution Comments: vertigo Restrictions Weight Bearing Restrictions: No   Vital Signs Therapy Vitals Pulse Rate: 77 BP: 124/70 mmHg Pain Pain Assessment Pain Assessment: No/denies pain Pain Score: 0-No pain Home Living/Prior Functioning Home Living Family/patient expects to be discharged to:: Private residence Available Help at Discharge: Family, Available 24 hours/day Type of Home: House Home Access: Stairs to enter [employer] of Steps: 2 Entrance Stairs-Rails: Left Home Layout: One level Bathroom Shower/Tub: Tub/shower unit, Curtain, Other (comment) (has grab bars in tub) Bathroom Toilet: Standard  Lives With: Spouse Prior Function Level of Independence: Independent with basic ADLs, Independent with homemaking with ambulation, Independent with gait, Independent with transfers, Independent with homemaking with wheelchair  Able to Take Stairs?: Yes Driving: Yes Vocation: Full time employment Vocation Requirements: school busdriver Leisure: Hobbies-yes (Comment) Comments: volunteers with [employer] ADL ADL ADL Comments: S with ADLs and ADL transfers Vision/Perception  Vision- History Baseline Vision/History: Wears  glasses Wears Glasses: Reading only;Distance only (pt does not keep glasses on all the time) Patient Visual Report: Blurring of vision Vision- Assessment Eye Alignment: Within Functional Limits Ocular Range of Motion: Within Functional Limits Alignment/Gaze Preference: Within Defined Limits Tracking/Visual Pursuits: Decreased smoothness of vertical tracking Additional Comments: unable to hold eye position at far end of ocular range, pt able to draw clock and complete letter cancellation easily; vertigo with head turns and looking down to floor  Cognition Overall Cognitive Status: Within Functional Limits for tasks assessed Arousal/Alertness: Awake/alert Orientation Level: Person;Place;Situation Person: Oriented Place: Oriented Situation: Oriented Year: 2017 Month: May Day of Week: Correct Memory: Appears intact Immediate Memory Recall: Sock;Blue;Bed Memory Recall: Sock;Blue;Bed Memory Recall Sock: Without Cue Memory Recall Blue: Without Cue Memory Recall Bed: With Cue Awareness: Appears intact Problem Solving: Appears intact Safety/Judgment: Appears intact Sensation Sensation Light Touch: Appears Intact Stereognosis: Appears Intact Hot/Cold: Appears Intact Proprioception: Appears Intact Coordination Gross Motor Movements are Fluid and Coordinated: Yes Fine Motor Movements are Fluid and Coordinated: Yes Finger Nose Finger Test: R hand 20 x in 10sec, L hand 15x in 10 sec but became dizzy with L hand Motor  Motor Motor: Within Functional Limits Motor - Skilled Clinical Observations: pt reports generalized muscle weakness Mobility    S with RW Trunk/Postural Assessment  Cervical Assessment Cervical Assessment: Within Functional Limits Thoracic Assessment Thoracic Assessment: Within Functional Limits Lumbar Assessment Lumbar Assessment: Within Functional Limits Postural Control Postural Control: Within Functional Limits  Balance Dynamic Sitting Balance Dynamic  Sitting - Level of Assistance: 7: Independent Static Standing Balance Static Standing - Level of Assistance: 5: Stand by assistance Dynamic Standing Balance Dynamic Standing - Level of Assistance: 4: Min assist Extremity/Trunk Assessment RUE Assessment RUE Assessment: Within Functional Limits LUE Assessment LUE Assessment: Within Functional Limits   See Function Navigator for Current Functional Status.   Refer to Care Plan for Long Term Goals  Recommendations for other services: Other: vestibular evaluation  Discharge Criteria: Patient  will be discharged from OT if patient refuses treatment 3 consecutive times without medical reason, if treatment goals not met, if there is a change in medical status, if patient makes no progress towards goals or if patient is discharged from hospital.  The above assessment, treatment plan, treatment alternatives and goals were discussed and mutually agreed upon: by patient  [place] 05/16/2016, 11:01 AM

## 2016-05-16 NOTE — Patient Care Conference (Signed)
Inpatient RehabilitationTeam Conference and Plan of Care Update Date: 05/16/2016   Time: 11:00 PM    Patient Name: [name]      Medical Record Number: [id number]  Date of Birth: Sep 22, 1951 Sex: Male         Room/Bed: 4W08C/4W08C-01 Payor Info: Payor: BLUE CROSS BLUE [employer]IELD / Plan: [facility] PPO / Product Type: *No Product type* /    Admitting Diagnosis: B  CVA  Admit Date/Time:  05/15/2016  5:01 PM Admission Comments: No comment available   Primary Diagnosis:  Cerebellar stroke (HCC) Principal Problem: Cerebellar stroke [facility])  Patient Active Problem List   Diagnosis Date Noted  . Nausea without vomiting 05/16/2016  . Cerebellar stroke (HCC) 05/15/2016  . Acute right PCA stroke (HCC) 05/10/2016  . Acute CVA (cerebrovascular accident) (HCC) 05/10/2016  . Emesis   . Vertigo 05/08/2016  . Intractable nausea and vomiting 05/08/2016    Expected Discharge Date: Expected Discharge Date: 05/19/16  Team Members Present: Physician leading conference: Dr. Claudette Laws Social Worker Present: Dossie Der, LCSW Nurse Present: Chana Bode, RN PT Present: Alyson Reedy, Nita Sickle, PT OT Present: Rosalio Loud, OT SLP Present: Jackalyn Lombard, SLP PPS Coordinator present : Tora Duck, RN, CRRN     Current Status/Progress Goal Weekly Team Focus  Medical   Dizziness with standing.  Had blood pressure drop on 5:15  Stable for home discharge withoutRequiring physical assistance  Discharge planning   Bowel/Bladder   Continent of bowel and bladder; LBM 5/18  Mod I  Assess and treat for constipation as needed   Swallow/Nutrition/ Hydration   Appetite adequate         ADL's   Supervision overall  Mod I, supervision shower transfers  vestibular/gaze stabilization, transfers, pt/family education   Mobility             Communication   min guard/S transfers, gait, stairs  mod I, S in community  Pt/family education, HEP for vestibular/balance/LE strength, gait  training, dynamic balance   Safety/Cognition/ Behavioral Observations  No unsafe behavior noted         Pain   Denies pain  < 3  Assess and treat for pain q shift and prn   Skin   No skin breakdown or infection noted  Mod I  Assess skin q shift and prn    Rehab Goals Patient on target to meet rehab goals: Yes Rehab Goals Revised: none *See Care Plan and progress notes for long and short-term goals.  Barriers to Discharge: Inconsistent with balance,    Possible Resolutions to Barriers:  Vestibular evaluation prior to discharge,, follow-up therapies    Discharge Planning/Teaching Needs:  Pt plans to return to his home with his wife to provide 24/7 supervision as needed.  Most of his goals are modified independent.  Pt's wife to come for family education on 05-18-16.   Team Discussion:  Goals-mod/i-supervision level-tub transfers. Vestibular eval today due to dizziness. Short length of stay 3-5 days. MD feels medically stable for Dc Sat  Revisions to Treatment Plan:  None   Continued Need for Acute Rehabilitation Level of Care: The patient requires daily medical management by a physician with specialized training in physical medicine and rehabilitation for the following conditions: Daily direction of a multidisciplinary physical rehabilitation program to ensure safe treatment while eliciting the highest outcome that is of practical value to the patient.: Yes Daily medical management of patient stability for increased activity during participation in an intensive rehabilitation regime.: Yes Daily  analysis of laboratory values and/or radiology reports with any subsequent need for medication adjustment of medical intervention for : Blood pressure problems;Neurological problems  Mirabella Hilario, Vista DeckJennifer Capps 05/17/2016, 2:12 PM

## 2016-05-16 NOTE — Telephone Encounter (Signed)
Left message to call back  

## 2016-05-16 NOTE — Evaluation (Signed)
Physical Therapy Assessment and Plan  Patient Details  Name: [name] MRN: [id number] Date of Birth: 10-16-1951  PT Diagnosis: Abnormality of gait, Difficulty walking, Dizziness and giddiness, Muscle weakness and Vertigo of central origin Rehab Potential: Excellent ELOS: 3-5 days   Today's Date: 05/16/2016 PT Individual Time: 1300-1400 PT Individual Time Calculation (min): 60 min    Problem List:  Patient Active Problem List   Diagnosis Date Noted  . Nausea without vomiting 05/16/2016  . Cerebellar stroke ([place]) 05/15/2016  . Acute right PCA stroke ([place]) 05/10/2016  . Acute CVA (cerebrovascular accident) ([place]) 05/10/2016  . Emesis   . Vertigo 05/08/2016  . Intractable nausea and vomiting 05/08/2016    Past Medical History:  Past Medical History  Diagnosis Date  . Healthy adult    Past Surgical History: History reviewed. No pertinent past surgical history.  Assessment & Plan Clinical Impression: Patient is a 65 y.o. male in relatively good health who was admitted on 05/09 with sudden onset of dizziness, N/V and weakness. CT head negative and MRI brain aborted due to nausea and abdominal discomfort. Neurology evaluated patient and he was found to have no focal deficits except nystagmus on right lateral gaze. He has had significant vertigo was treated for dizziness. MRI brain done today showing large area of acute infarct in right cerebellar infarct involving most of PICA on right and small areas of acute infarct in left inferior cerebellum with mild mass effect on 4th ventricle. 2D echo with EF 55-60% with grade 1 diastolic dysfunction and mild MVR. Bilateral PICA infarcts felt to be embolic of unknown etiology and patient on ASA for secondary stroke prevention. Patient with resultant dizziness and problems with ambulation due to vestibular issues. PT evaluation done today and CIR recommended for follow up therapy. Patient transferred to CIR on 05/15/2016 .   Patient currently  requires min with mobility secondary to central origin vestibular impairments and decreased sitting balance, decreased standing balance, decreased postural control and decreased balance strategies.  Prior to hospitalization, patient was independent  with mobility and lived with Spouse in a House home.  Home access is 2Stairs to enter.  Patient will benefit from skilled PT intervention to maximize safe functional mobility, minimize fall risk and decrease caregiver burden for planned discharge home with 24 hour supervision.  Anticipate patient will benefit from follow up OP at discharge.  PT - End of Session Activity Tolerance: Tolerates 30+ min activity with multiple rests Endurance Deficit: Yes Endurance Deficit Description: requires rest breaks due to fatigue afer 2-3 min standing mobility task PT Assessment Rehab Potential (ACUTE/IP ONLY): Excellent PT Patient demonstrates impairments in the following area(s): Balance;Endurance;Motor;Safety PT Transfers Functional Problem(s): Bed Mobility;Bed to Chair;Car;Floor;Furniture PT Locomotion Functional Problem(s): Ambulation;Stairs PT Plan PT Intensity: Minimum of 1-2 x/day ,45 to 90 minutes PT Frequency: 5 out of 7 days PT Duration Estimated Length of Stay: 3-5 days PT Treatment/Interventions: Ambulation/gait training;Balance/vestibular training;Community reintegration;Discharge planning;Functional mobility training;Neuromuscular re-education;UE/LE Coordination activities;UE/LE Strength taining/ROM;Therapeutic Exercise;Therapeutic Activities;Stair training;Visual/perceptual remediation/compensation;Patient/family education;Disease management/prevention PT Transfers Anticipated Outcome(s): modI PT Locomotion Anticipated Outcome(s): mod I in home, S in community PT Recommendation Recommendations for Other Services: Vestibular eval Follow Up Recommendations: Outpatient PT Patient destination: Home Equipment Recommended: None recommended by  PT  Skilled Therapeutic Intervention Pt received seated on EOB; denies pain and agreeable to treatment. Initial PT evaluation performed and completed with min guard/S overall as described in detail below. Assessed Berg Balance Scale and DGI with results as below, indicative of high falls risk. Instructed  pt in VOR x1 exercises in sitting; able to tolerate approximately 10-15 seconds before increase in dizziness. Educated pt on frequent performance to improve gaze stability and reduce dizziness. Educated pt on rehab goals at [facility] I/S level, estimated length of stay, falls prevention with request that pt call for assist before getting up; pt agreeable to all the above. Pt remained supine in bed at completion of session, all needs within reach.   PT Evaluation Precautions/Restrictions Precautions Precautions: Fall Precaution Comments: vertigo Restrictions Weight Bearing Restrictions: No General Chart Reviewed: Yes Response to Previous Treatment: Patient reporting fatigue but able to participate. Family/Caregiver Present: Yes (wife present initially, left at beginning of session)  Pain Pain Assessment Pain Assessment: No/denies pain Home Living/Prior Functioning Home Living Available Help at Discharge: Family;Available 24 hours/day Type of Home: House Home Access: Stairs to enter [employer] of Steps: 2 Entrance Stairs-Rails: Left Home Layout: One level  Lives With: Spouse Prior Function Level of Independence: Independent with basic ADLs;Independent with homemaking with ambulation;Independent with gait;Independent with transfers;Independent with homemaking with wheelchair  Able to Take Stairs?: Yes Driving: Yes Vocation: Full time employment Vocation Requirements: school busdriver Leisure: Hobbies-yes (Comment) Comments: volunteers with [employer], yard work Vision/Perception  Vision - Risk analyst: Within Passenger transport manager Range of Motion: Within  Functional Limits Alignment/Gaze Preference: Within Defined Limits Tracking/Visual Pursuits: Decreased smoothness of vertical tracking  Cognition Overall Cognitive Status: Within Functional Limits for tasks assessed Arousal/Alertness: Awake/alert Orientation Level: Oriented X4 Memory: Appears intact Awareness: Appears intact Problem Solving: Appears intact Safety/Judgment: Appears intact Sensation Sensation Light Touch: Appears Intact Stereognosis: Not tested Hot/Cold: Not tested Proprioception: Appears Intact Coordination Gross Motor Movements are Fluid and Coordinated: Yes Heel Shin Test: WFL BLE Motor  Motor Motor: Within Functional Limits Motor - Skilled Clinical Observations: pt reports generalized muscle weakness  Mobility Bed Mobility Bed Mobility: Supine to Sit;Sit to Supine Supine to Sit: 6: Modified independent (Device/Increase time) Sit to Supine: 6: Modified independent (Device/Increase time) Transfers Transfers: Yes Stand Pivot Transfers: 5: Supervision Stand Pivot Transfer Details: Verbal cues for technique;Verbal cues for precautions/safety Locomotion  Ambulation Ambulation: Yes Ambulation/Gait Assistance: 4: Min guard Ambulation Distance (Feet): 200 Feet Assistive device: None Gait Gait: Yes Gait Pattern: Impaired Gait Pattern: Shuffle;Decreased stride length;Right flexed knee in stance;Left flexed knee in stance Gait velocity: decreased for age/gender norms High Level Ambulation High Level Ambulation: Head turns;Direction changes Direction Changes: min guard, slow speed Head Turns: min guard Stairs / Additional Locomotion Stairs: Yes Stairs Assistance: 5: Supervision Stairs Assistance Details: Verbal cues for technique;Verbal cues for precautions/safety Stair Management Technique: Two rails;Alternating pattern;Forwards Number of Stairs: 12 Height of Stairs: 6 Ramp: 5: Supervision Curb: 5: Supervision Wheelchair Mobility Wheelchair Mobility:  No  Trunk/Postural Assessment  Cervical Assessment Cervical Assessment: Within Functional Limits Thoracic Assessment Thoracic Assessment: Within Functional Limits Lumbar Assessment Lumbar Assessment: Within Functional Limits Postural Control Postural Control: Deficits on evaluation (impaired/inefficient righting/stepping reactions)  Balance Balance Balance Assessed: Yes Standardized Balance Assessment Standardized Balance Assessment: Berg Balance Test;Dynamic Gait Index;Timed Up and Go Test Berg Balance Test Sit to Stand: Able to stand without using hands and stabilize independently Standing Unsupported: Able to stand safely 2 minutes Sitting with Back Unsupported but Feet Supported on Floor or Stool: Able to sit safely and securely 2 minutes Stand to Sit: Sits safely with minimal use of hands Transfers: Able to transfer safely, minor use of hands Standing Unsupported with Eyes Closed: Able to stand 10 seconds with supervision Standing Ubsupported with Feet  Together: Able to place feet together independently and stand for 1 minute with supervision From Standing, Reach Forward with Outstretched Arm: Can reach forward >12 cm safely (5") From Standing Position, Pick up Object from Floor: Able to pick up shoe, needs supervision From Standing Position, Turn to Look Behind Over each Shoulder: Looks behind from both sides and weight shifts well Turn 360 Degrees: Needs close supervision or verbal cueing Standing Unsupported, Alternately Place Feet on Step/Stool: Needs assistance to keep from falling or unable to try Standing Unsupported, One Foot in Front: Able to plae foot ahead of the other independently and hold 30 seconds Standing on One Leg: Tries to lift leg/unable to hold 3 seconds but remains standing independently Total Score: 41 Dynamic Gait Index Level Surface: Mild Impairment Change in Gait Speed: Mild Impairment Gait with Horizontal Head Turns: Mild Impairment Gait with  Vertical Head Turns: Mild Impairment Gait and Pivot Turn: Moderate Impairment Step Over Obstacle: Moderate Impairment Step Around Obstacles: Mild Impairment Steps: Mild Impairment Total Score: 14 Timed Up and Go Test TUG: Normal TUG;Cognitive TUG Normal TUG (seconds): 15 Cognitive TUG (seconds): 19 Static Sitting Balance Static Sitting - Balance Support: No upper extremity supported;Feet supported Static Sitting - Level of Assistance: 7: Independent Dynamic Sitting Balance Dynamic Sitting - Balance Support: No upper extremity supported;Feet supported Dynamic Sitting - Level of Assistance: 7: Independent Dynamic Sitting - Balance Activities: Lateral lean/weight shifting;Forward lean/weight shifting;Reaching for weighted objects;Reaching across midline Static Standing Balance Static Standing - Balance Support: No upper extremity supported Static Standing - Level of Assistance: 5: Stand by assistance Static Stance: Eyes closed Static Stance: Eyes Closed: x30 sec with S Dynamic Standing Balance Dynamic Standing - Balance Support: No upper extremity supported Dynamic Standing - Level of Assistance: 4: Min assist Dynamic Standing - Balance Activities: Lateral lean/weight shifting;Forward lean/weight shifting;Reaching across midline Extremity Assessment  RUE Assessment RUE Assessment: Within Functional Limits LUE Assessment LUE Assessment: Within Functional Limits RLE Assessment RLE Assessment: Exceptions to [facility] (mild strength deficits 4+/5 throughout) LLE Assessment LLE Assessment: Exceptions to [facility] [facility] (mild strength deficits 4+/5 throughout)   See Function Navigator for Current Functional Status.   Refer to Care Plan for Long Term Goals  Recommendations for other services: None  Discharge Criteria: Patient will be discharged from PT if patient refuses treatment 3 consecutive times without medical reason, if treatment goals not met, if there is a change in medical status, if  patient makes no progress towards goals or if patient is discharged from hospital.  The above assessment, treatment plan, treatment alternatives and goals were discussed and mutually agreed upon: by patient  Luberta Mutter 05/16/2016, 3:50 PM

## 2016-05-17 ENCOUNTER — Inpatient Hospital Stay (HOSPITAL_COMMUNITY): Payer: BC Managed Care – PPO | Admitting: Physical Therapy

## 2016-05-17 ENCOUNTER — Inpatient Hospital Stay (HOSPITAL_COMMUNITY): Payer: BC Managed Care – PPO

## 2016-05-17 ENCOUNTER — Inpatient Hospital Stay (HOSPITAL_COMMUNITY): Payer: BC Managed Care – PPO | Admitting: Occupational Therapy

## 2016-05-17 DIAGNOSIS — R269 Unspecified abnormalities of gait and mobility: Secondary | ICD-10-CM

## 2016-05-17 DIAGNOSIS — E785 Hyperlipidemia, unspecified: Secondary | ICD-10-CM | POA: Insufficient documentation

## 2016-05-17 DIAGNOSIS — I69398 Other sequelae of cerebral infarction: Secondary | ICD-10-CM

## 2016-05-17 MED ORDER — LORAZEPAM 2 MG/ML IJ SOLN
0.5000 mg | Freq: Once | INTRAMUSCULAR | Status: AC
  Administered 2016-05-17: 0.5 mg via INTRAMUSCULAR

## 2016-05-17 MED ORDER — LORAZEPAM 2 MG/ML IJ SOLN
INTRAMUSCULAR | Status: AC
  Filled 2016-05-17: qty 1

## 2016-05-17 MED ORDER — SODIUM CHLORIDE 0.9 % IV SOLN
75.0000 mL/h | INTRAVENOUS | Status: DC
  Administered 2016-05-17: 75 mL/h via INTRAVENOUS

## 2016-05-17 MED ORDER — ACETAMINOPHEN 325 MG PO TABS
650.0000 mg | ORAL_TABLET | ORAL | Status: DC | PRN
  Administered 2016-05-17 (×2): 650 mg via ORAL
  Filled 2016-05-17 (×2): qty 2

## 2016-05-17 MED ORDER — LORAZEPAM 2 MG/ML IJ SOLN: 0.5000 mg | Freq: Three times a day (TID) | INTRAMUSCULAR | Status: DC | PRN

## 2016-05-17 NOTE — Significant Event (Signed)
Rapid Response Event Note  Overview: Time Called: 1453 Arrival Time: 1455 Event Type: Neurologic  Initial Focused Assessment: Patient with sudden onset of dizziness and nausea while sitting in the chair about 1420 this afternoon. BP 132/73  HR 75  RR 24 O2 sat 100% Patient has received 0.5 Ativan and 4mg  Zofran.  He is still complaining of sever dizziness and some nausea, he is also shivering  Interventions: Patient assisted back to bed. NIHSS 0, dizziness improving  NS started at 75cc/hr Stat head CT done Dr Roda ShuttersXu at bedside to assess patient. Rn to call if assistance needed   Event Summary: Name of Physician Notified: Pam PA at bedside at    Name of Consulting Physician Notified: Roda ShuttersXu at 1445  Outcome: Stayed in room and stabalized  Event End Time: 1510  [name], [name]

## 2016-05-17 NOTE — Progress Notes (Signed)
Occupational Therapy Session Note  Patient Details  Name: [name] MRN: [id number] Date of Birth: 1951/11/05  Today's Date: 05/17/2016 OT Individual Time: 6387-5643 OT Individual Time Calculation (min): 55 min    Short Term Goals: No short term goals set  Skilled Therapeutic Interventions/Progress Updates:    Pt seen for skilled OT to facilitate dynamic balance and visual habituation skills with ADL retraining. Pt completed ADLs without AD for mobility. Used shower seat for safety. Pt is now set up only with bathing and dressing.  Pt ambulated to tub room to practice stepping in/out of tub with S using grab bars. Pt able to complete 3x.  Pt has same tub set up at home. Pt ambulated to gym to use [employer] for balance and visual fixation training with the weight shift and limits of stability training tools. Pt enjoyed activity and was eager to increase level of challenge. Pt progressed from level static to level 8.  Pt did feel slightly dizzy at end of activity and needed to stand to rest for a few minutes. Pt then ambulated back to room with all needs met.  Therapy Documentation Precautions:  Precautions Precautions: Fall Precaution Comments: vertigo Restrictions Weight Bearing Restrictions: No    Vital Signs: Therapy Vitals Temp: 97.9 F (36.6 C) Temp Source: Oral Pulse Rate: (!) 53 Resp: 20 BP: 128/81 mmHg Patient Position (if appropriate): Lying Oxygen Therapy SpO2: 99 % O2 Device: Not Delivered Pain: Pain Assessment Pain Assessment: No/denies pain Pain Score: 0-No pain ADL: ADL ADL Comments: S with ADLs and ADL transfers  See Function Navigator for Current Functional Status.   Therapy/Group: Individual Therapy  [place] 05/17/2016, 9:49 AM

## 2016-05-17 NOTE — Progress Notes (Signed)
RN called to room by patients wife stating patient is "dizzy."  Upon entering room, patient was sitting in recliner chair looking distressed holding emesis bag.  Patient stated "the room is spinning."  Patient proceeded to vomit about 200ml of stomach contents.  Assisted the patient to reclining position.  He was unable to fixate eyes on anything specific therefore RN instructed him to close his eyes.  VSS BP 138/80.  Despite position change, patient wasn't feeling any better.  Notified Marissa NestlePam Love, PA who assessed patient and ordered stat CT scan of head, along with IV zofran for nausea, and IV ativan for tremors.  RN accompanied patient to CT and back along with Rapid Response Nurse.  Marissa NestlePam Love re-consulted neurology for input.  Patient states he is feeling better now but is still dizzy. VS still stable at this time.  Wife at bedside.  MRI orders written by neurology.  Awaiting transport to radiology.  Will continue to monitor closely.  Dani Gobbleeardon, Mykelti Goldenstein J, RN

## 2016-05-17 NOTE — Progress Notes (Signed)
Occupational Therapy Session Note  Patient Details  Name: [name] MRN: [id number] Date of Birth: 12/06/1951  Today's Date: 05/17/2016 OT Individual Time: [id number] OT Individual Time Calculation (min): 30 min    Skilled Therapeutic Interventions/Progress Updates: Patient seen vestibular rehab as related to visual positioning and endurance activities.   He tolerated well the visual vestibular exercises well in standing and dizziness resolved after he focused on an object stratight ahead.    Therapy Documentation Precautions:  Precautions Precautions: Fall Precaution Comments: vertigo Restrictions Weight Bearing Restrictions: No Pain: Pain Assessment Pain Assessment: No/denies pain See Function Navigator for Current Functional Status.   Therapy/Group: Individual Therapy  Bud Faceickett, Shanyah Gattuso [facility] HospitalYeary 05/17/2016, 1:01 PM

## 2016-05-17 NOTE — Progress Notes (Signed)
Social Work Patient ID: [name], male   DOB: 08/27/1951, 65 y.o.   MRN: [id number]  CSW spoke with pt and his wife about team conference discussion and targeted d/c date of 05-19-16.  Pt/wife are prepared for his return home at that time.  Wife to come through therapies with pt tomorrow morning from 7:30-9:30.  CSW is working on getting pt established with a PCP at the office where his wife goes.  Pt will need PT for vestibular rehab as an outpt and CSW will arrange this, as well.  Only DME recommended is a shower seat so that pt does not have to stand the entire shower in case he becomes dizzy.  Pt wants to wait on this until he sees how he does at home.  They do plan to put non-slip mat in the shower, however.  CSW will continue to follow and assist as needed.

## 2016-05-17 NOTE — Telephone Encounter (Signed)
Spoke with pt's wife and informed her that Dr. Eldridge DaceVaranasi said it would be ok for pt to wear monitor while in rehab. Appt was cancelled for today because pt is still in the hospital. Did advise wife if there was any way possible since pt is seeing Dr. Eldridge DaceVaranasi on 6/29 it would be great to get the monitor on the pt and have it completed prior to that appt. Wife states that she wants to talk to them at the hospital and see when he is getting out and then will call and make that appt.

## 2016-05-17 NOTE — Progress Notes (Signed)
Physical Therapy Session Note  Patient Details  Name: [name] MRN: [id number] Date of Birth: 03/10/1951  Today's Date: 05/17/2016    Skilled Therapeutic Interventions/Progress Updates:    Pt missed 60 min PT due to N/V, and pt taken for CT. Continue per POC.   Therapy Documentation General: PT Amount of Missed Time (min): 60 Minutes PT Missed Treatment Reason: CT/MRI;Other (Comment) (nausea/vomiting)   See Function Navigator for Current Functional Status.   Carolynn Commentlizabeth J Tygielski 05/17/2016, 3:17 PM

## 2016-05-17 NOTE — Progress Notes (Signed)
Occupational Therapy Session Note  Patient Details  Name: [name] MRN: [id number] Date of Birth: 07/24/1951  Today's Date: 05/17/2016 OT Individual Time: 1015-1100 OT Individual Time Calculation (min): 45 min    Short Term Goals: Week 1:  OT Short Term Goal 1 (Week 1): STGs = LTGs  Skilled Therapeutic Interventions/Progress Updates:    Treatment session with focus on functional mobility and management of dizziness/nausea during functional tasks in home environment.  Pt reports enjoying completing housekeeping tasks, therefore engaged in functional tasks of unloading dishwasher and vacuuming to address oculomotor ROM, saccades, and VOR during functional tasks.  Pt reports mild dizziness (3/10) with unloading dishwasher due to vertical scanning and head movements.  Pt reports 4-5/10 dizziness when bending down and then kneeling down to plug in vacuum cord.  Engaged in vacuuming with pt reporting increased dizziness with long, quick strides of vacuum, therapist recommended shorter, slower strides with pt reporting decrease in dizziness.  Engaged in visual scanning on elevated therapy mat with pt having to scan environment to locate missing numbered disc, progressing to visually locating and tapping specific discs with hand as they were called out.  Incorporated ambulation with visual scanning to locate items on floor and waist height surfaces, pt reports dizziness of 3/10.  Noted improved awareness of oncoming dizziness and needing seated break.  Therapy Documentation Precautions:  Precautions Precautions: Fall Precaution Comments: vertigo Restrictions Weight Bearing Restrictions: No General:   Vital Signs: Therapy Vitals BP: 128/81 mmHg Pain: Pain Assessment Pain Assessment: No/denies pain ADL: ADL ADL Comments: S with ADLs and ADL transfers  See Function Navigator for Current Functional Status.   Therapy/Group: Individual Therapy  [name], [name] 05/17/2016, 12:23 PM

## 2016-05-17 NOTE — Progress Notes (Signed)
Subjective/Complaints: Patient had a dizzy spell this morning. He was in a chair. Transferred back to bed with resolution of symptoms. No dizziness with sitting at edge of bed currently.  Review of systems negative for chest pain, shortness of breath, nausea, vomiting, diarrhea, constipation  Objective: Vital Signs: Blood pressure 128/81, pulse 53, temperature 97.9 F (36.6 C), temperature source Oral, resp. rate 20, height _0  (1.854 m), weight 89.359 kg (197 lb), SpO2 99 %. No results found. Results for orders placed or performed during the hospital encounter of 05/15/16 (from the past 72 hour(s))  CBC WITH DIFFERENTIAL     Status: None   Collection Time: 05/16/16  4:30 AM  Result Value Ref Range   WBC 7.7 4.0 - 10.5 K/uL   RBC 4.79 4.22 - 5.81 MIL/uL   Hemoglobin 15.6 13.0 - 17.0 g/dL   HCT 45.9 39.0 - 52.0 %   MCV 95.8 78.0 - 100.0 fL   MCH 32.6 26.0 - 34.0 pg   MCHC 34.0 30.0 - 36.0 g/dL   RDW 12.8 11.5 - 15.5 %   Platelets 205 150 - 400 K/uL   Neutrophils Relative % 56 %   Neutro Abs 4.3 1.7 - 7.7 K/uL   Lymphocytes Relative 28 %   Lymphs Abs 2.1 0.7 - 4.0 K/uL   Monocytes Relative 12 %   Monocytes Absolute 1.0 0.1 - 1.0 K/uL   Eosinophils Relative 4 %   Eosinophils Absolute 0.3 0.0 - 0.7 K/uL   Basophils Relative 0 %   Basophils Absolute 0.0 0.0 - 0.1 K/uL  Comprehensive metabolic panel     Status: Abnormal   Collection Time: 05/16/16  4:30 AM  Result Value Ref Range   Sodium 142 135 - 145 mmol/L   Potassium 3.9 3.5 - 5.1 mmol/L   Chloride 107 101 - 111 mmol/L   CO2 26 22 - 32 mmol/L   Glucose, Bld 88 65 - 99 mg/dL   BUN 15 6 - 20 mg/dL   Creatinine, Ser 0.98 0.61 - 1.24 mg/dL   Calcium 8.9 8.9 - 10.3 mg/dL   Total Protein 6.0 (L) 6.5 - 8.1 g/dL   Albumin 3.1 (L) 3.5 - 5.0 g/dL   AST 24 15 - 41 U/L   ALT 28 17 - 63 U/L   Alkaline Phosphatase 67 38 - 126 U/L   Total Bilirubin 0.7 0.3 - 1.2 mg/dL   GFR calc non Af Amer >60 >60 mL/min   GFR calc Af Amer  >60 >60 mL/min    Comment: (NOTE) The eGFR has been calculated using the CKD EPI equation. This calculation has not been validated in all clinical situations. eGFR's persistently <60 mL/min signify possible Chronic Kidney Disease.    Anion gap 9 5 - 15     HEENT: normal Cardio: RRR and no murmur Resp: CTA B/L and unlabored GI: BS positive and NT, ND Extremity:  No Edema Skin:   Intact Neuro: Alert/Oriented, Normal Sensory , Abnormal FMC Ataxic/ dec FMC and Dysarthric Musc/Skel:  Normal Gen NAD Ambulates with standby assistance widened base of support.  Assessment/Plan: 1. Functional deficits secondary to Trunkal ataxia bilateral cerebellar which require 3+ hours per day of interdisciplinary therapy in a comprehensive inpatient rehab setting. Physiatrist is providing close team supervision and 24 hour management of active medical problems listed below. Physiatrist and rehab team continue to assess barriers to discharge/monitor patient progress toward functional and medical goals. FIM: Function - Bathing Position: Shower Body parts bathed by patient:  Right arm, Left arm, Chest, Abdomen, Front perineal area, Buttocks, Right upper leg, Left upper leg, Right lower leg, Left lower leg Assist Level: Set up Set up : To obtain items  Function- Upper Body Dressing/Undressing What is the patient wearing?: Pull over shirt/dress Pull over shirt/dress - Perfomed by patient: Thread/unthread right sleeve, Thread/unthread left sleeve, Put head through opening, Pull shirt over trunk Assist Level: No help, No cues Set up : To obtain clothing/put away Function - Lower Body Dressing/Undressing What is the patient wearing?: Pants, Socks, Shoes Position: Sitting EOB Pants- Performed by patient: Thread/unthread right pants leg, Thread/unthread left pants leg, Pull pants up/down Non-skid slipper socks- Performed by patient: Don/doff right sock, Don/doff left sock Socks - Performed by patient:  Don/doff right sock, Don/doff left sock Shoes - Performed by patient: Don/doff right shoe, Don/doff left shoe Assist for footwear: Setup Assist for lower body dressing: No Help, No cues  Function - Toileting Toileting steps completed by patient: Adjust clothing prior to toileting, Performs perineal hygiene, Adjust clothing after toileting Assist level: Supervision or verbal cues  Function - Air cabin crew transfer assistive device: Walker Assist level to toilet: Supervision or verbal cues Assist level from toilet: Supervision or verbal cues  Function - Chair/bed transfer Chair/bed transfer assist level: Supervision or verbal cues Chair/bed transfer assistive device: Walker Chair/bed transfer details: Verbal cues for precautions/safety, Verbal cues for technique  Function - Locomotion: Wheelchair Will patient use wheelchair at discharge?: No Function - Locomotion: Ambulation Assistive device: No device Max distance: 200 Assist level: Touching or steadying assistance (Pt > 75%) Assist level: Touching or steadying assistance (Pt > 75%) Assist level: Touching or steadying assistance (Pt > 75%) Assist level: Touching or steadying assistance (Pt > 75%) Assist level: Supervision or verbal cues  Function - Comprehension Comprehension: Auditory Comprehension assist level: Follows complex conversation/direction with no assist  Function - Expression Expression: Verbal Expression assist level: Expresses complex ideas: With no assist  Function - Social Interaction Social Interaction assist level: Interacts appropriately with others - No medications needed.  Function - Problem Solving Problem solving assist level: Solves complex problems: Recognizes & self-corrects  Function - Memory Memory assist level: Complete Independence: No helper Patient normally able to recall (first 3 days only): That he or she is in a hospital, Current season, Location of own room, Staff names and  faces   Medical Problem List and Plan: 1.  Truncal ataxia/balance and functional deficits secondary to bilateral cerebellar infarcts- Continue rehabilitation. Plan for discharge on 5/20, Patient had few risk factors for stroke. Drank 5 cups of coffee per day., We discussed that this can increase risk for arrhythmia but does not have any vascular risk that I'm aware of 2.  DVT Prophylaxis/Anticoagulation: Pharmaceutical: Lovenox, Ambulation distance improving we'll discontinue 3. Pain Management: N/A 4. Mood: Very motivated.  LCSW to follow for evaluation and support.   5. Neuropsych: This patient is capable of making decisions on his own behalf. Safety precautions emphasized.   6. Skin/Wound Care: Maintain adequate  Nutrition and hydration.   7. Fluids/Electrolytes/Nutrition: Monitor I/O. Check lytes in am. Offer supplements between meals prn  Poor intake.   8. Grade 1 diastolic dysfunction/Blood pressure: Metoprolol added but has had episodes of bradycardia as well as hypotension--permissive HTN to allow for adequate perfusion.     9. Dyslipidemia: On lipitor.   LOS (Days) 2 A FACE TO FACE EVALUATION WAS PERFORMED  Jani Moronta E 05/17/2016, 9:52 AM

## 2016-05-17 NOTE — Progress Notes (Addendum)
STROKE TEAM PROGRESS NOTE   SUBJECTIVE (INTERVAL HISTORY) His wife is at the bedside.  Pt was sitting in chair this afternoon and had acute sudden onset vertigo with room spinning and N/V. BP 130/80. Put in bed, and gave ativan and feeling better. Had CT head which did not show acute abnormality. No hydrocephalus or hemorrhage. Currently pt still felt a little dizzy, but no room spinning, no more N/V. Complaining of coldness.   As per wife and pt, he had extensive work out today than the last several days. He had activity since 6am and he was tired. Also, he was worried about going home and there is anxiety going on.    OBJECTIVE Temp:  [97.5 F (36.4 C)-97.9 F (36.6 C)] 97.5 F (36.4 C) (05/18 1440) Pulse Rate:  [53-92] 77 (05/18 1506) Cardiac Rhythm:  [-]  Resp:  [20-22] 22 (05/18 1440) BP: (108-133)/(66-82) 133/82 mmHg (05/18 1506) SpO2:  [99 %-100 %] 100 % (05/18 1442)  CBC:   Recent Labs Lab 05/12/16 1035 05/16/16 0430  WBC 7.0 7.7  NEUTROABS 4.4 4.3  HGB 17.2* 15.6  HCT 49.7 45.9  MCV 94.0 95.8  PLT 195 205    Basic Metabolic Panel:   Recent Labs Lab 05/12/16 1035 05/15/16 0612 05/16/16 0430  NA 141  --  142  K 3.5  --  3.9  CL 106  --  107  CO2 23  --  26  GLUCOSE 120*  --  88  BUN 13  --  15  CREATININE 0.94 0.90 0.98  CALCIUM 9.1  --  8.9    Lipid Panel:     Component Value Date/Time   CHOL 165 05/09/2016 0042   TRIG 34 05/09/2016 0042   HDL 41 05/09/2016 0042   CHOLHDL 4.0 05/09/2016 0042   VLDL 7 05/09/2016 0042   LDLCALC 117* 05/09/2016 0042   HgbA1c:  Lab Results  Component Value Date   HGBA1C 5.3 05/09/2016   Urine Drug Screen:     Component Value Date/Time   LABOPIA NONE DETECTED 05/08/2016 2040   COCAINSCRNUR NONE DETECTED 05/08/2016 2040   LABBENZ NONE DETECTED 05/08/2016 2040   AMPHETMU NONE DETECTED 05/08/2016 2040   THCU NONE DETECTED 05/08/2016 2040   LABBARB NONE DETECTED 05/08/2016 2040      IMAGING  Ct Head Wo  Contrast 05/08/2016   Mildly limited examination demonstrating mild atrophy with no acute abnormality.   Mr Brain Wo Contrast 05/10/2016   Large acute RIGHT cerebellar infarct, nonhemorrhagic, PICA territory. Much smaller infarct of the LEFT medial inferior cerebellum, also PICA territory. Mild mass effect on the fourth ventricle. The patient should be observed for signs of developing obstructive hydrocephalus. A call is in to the care team.   CTA head ane neck - Acute PICA infarct right greater than left. There appears to be decreased flow in the right PICA which may be related to the acute infarct and partial occlusion. No filling defect or thrombus identified. No vertebral artery stenosis or dissection is identified as a cause of the infarct. No significant carotid or vertebral artery stenosis in the neck. Negative for dissection.  TTE - - Left ventricle: The cavity size was normal. Wall thickness was  increased increased in a pattern of mild to moderate LVH.  Systolic function was normal. The estimated ejection fraction was  in the range of 55% to 60%. Wall motion was normal; there were no  regional wall motion abnormalities. Doppler parameters are  consistent with abnormal  left ventricular relaxation (grade 1  diastolic dysfunction). - Mitral valve: There was mild regurgitation. - Left atrium: The atrium was mildly dilated. Impressions: - No cardiac source of emboli was indentified.  LE venous doppler - Bilateral: No evidence of DVT, superficial thrombosis, or Baker's Cyst.  CT head 05/17/16 1. Expected evolution of subacute infarction in the RIGHT cerebellum. 2. No evidence of new infarction or hemorrhage. 3. No new mass effect.  PHYSICAL EXAM  Temp:  [97.5 F (36.4 C)-97.9 F (36.6 C)] 97.5 F (36.4 C) (05/18 1440) Pulse Rate:  [53-92] 77 (05/18 1506) Resp:  [20-22] 22 (05/18 1440) BP: (108-133)/(66-82) 133/82 mmHg (05/18 1506) SpO2:  [99 %-100 %] 100 % (05/18  1442)  General - Well nourished, well developed, in no apparent distress.  Ophthalmologic - Fundi not visualized due to noncooperation.  Cardiovascular - Regular rate and rhythm with no murmur.  Mental Status -  Level of arousal and orientation to time, place, and person were intact. Language including expression, naming, repetition, comprehension was assessed and found intact. Fund of Knowledge was assessed and was intact.  Cranial Nerves II - XII - II - Visual field intact OU. III, IV, VI - Extraocular movements intact. V - Facial sensation intact bilaterally. VII - Facial movement intact bilaterally. VIII - Hearing & vestibular intact bilaterally, no nystagmus. Head thrust test negative but can see unsustained nystagmus and pt felt dizzy. X - Palate elevates symmetrically. XI - Chin turning & shoulder shrug intact bilaterally. XII - Tongue protrusion intact.  Motor Strength - The patient's strength was normal in all extremities and pronator drift was absent.  Bulk was normal and fasciculations were absent.   Motor Tone - Muscle tone was assessed at the neck and appendages and was normal.  Reflexes - The patient's reflexes were 1+ in all extremities and he had no pathological reflexes.  Sensory - Light touch, temperature/pinprick were assessed and were symmetrical.    Coordination - The patient had normal movements in the hands and legs with no ataxia or dysmetria. Tremor was absent.  Gait and Station - deferrerd due to dizzy feeling.  Dix Hallpike test not done due to dizziness.    ASSESSMENT/PLAN Mr. Denali Becvar is a 65 y.o. male with no significant past medical history presenting with sudden onset vertigo and nausea. He did not receive IV t-PA.   Likely recrudescence of previous stroke - will do MRI to rule out new stroke  CT no new findings  Head thrust test negative indicating central vertigo  Pt had extensive work up today and feeling tired with anxiety going  home  Will do MRI brain limited to rule out new infarcts  Recommend gradual rehab schedule  Cope with stress and anxiety, benzo PRN  Stroke:  bilateral PICA infarct, R>L, consistent with right PICA occlusion or high grade stenosis.  Resultant  Dizziness, nausea  MRI  bilateral PICA infarct, R>L  CTA head and neck possible right PICA occlusion vs. High grade stenosis, no VA dissection or stenosis   Repeat CT stable no hydrocephalus  2D Echo  Unremarkable  LE venous doppler no DVT  LDL 117  HgbA1c 5.3  Lovenox 40 mg sq daily for VTE prophylaxis  Diet Heart Room service appropriate?: Yes; Fluid consistency:: Thin. On thin liquid diet given nausea.  No antithrombotic prior to admission, now on aspirin 325 mg daily.   Patient counseled to be compliant with his antithrombotic medications  Ongoing aggressive stroke risk factor management  Hyperlipidemia  Home meds:  No statin  LDL 117, goal < 70  Now on lipitor 40 mg daily  Continue statin at discharge  Other Stroke Risk Factors  Advanced age  Hospital day # 2  The patient has recurrent stroke like symptoms and rapid response team was called. He is at risk for recurrent strokes and TIAs and neurological worsening and he needs ongoing stroke evaluation and aggressive risk factor modification. I had obtained history for pt and family, reviewed images personally, and had long discussion with wife and pt regarding the possible diagnosis and further treatment plan. I spend more than 35 min in taking care of this pt.   Marvel PlanJindong Orpheus Hayhurst, MD PhD Stroke Neurology 05/17/2016 3:59 PM     To contact Stroke Continuity provider, please refer to [email or web address]. After hours, contact General Neurology

## 2016-05-18 ENCOUNTER — Inpatient Hospital Stay (HOSPITAL_COMMUNITY): Payer: BC Managed Care – PPO | Admitting: Occupational Therapy

## 2016-05-18 ENCOUNTER — Inpatient Hospital Stay (HOSPITAL_COMMUNITY): Payer: BC Managed Care – PPO | Admitting: Physical Therapy

## 2016-05-18 DIAGNOSIS — E785 Hyperlipidemia, unspecified: Secondary | ICD-10-CM

## 2016-05-18 MED ORDER — ATORVASTATIN CALCIUM 40 MG PO TABS: 40.0000 mg | ORAL_TABLET | Freq: Every day | ORAL | Status: AC

## 2016-05-18 MED ORDER — MECLIZINE HCL 12.5 MG PO TABS: 12.5000 mg | ORAL_TABLET | Freq: Three times a day (TID) | ORAL | Status: AC | PRN

## 2016-05-18 NOTE — Discharge Summary (Signed)
Physician Discharge Summary  Patient ID: [name] MRN: [id number] DOB/AGE: 66/03/1951 66 y.o.  Admit date: 05/15/2016 Discharge date: 05/18/2016  Discharge Diagnoses:  Principal Problem:   Cerebellar stroke [facility](HCC) Active Problems:   Vertigo   Nausea without vomiting   HLD (hyperlipidemia)   Discharged Condition: Stable  Significant Diagnostic Studies: Ct Head Wo Contrast  05/17/2016  CLINICAL DATA:  64 YOM. Patient unable to keep eyes open, nauseous and dizzy. Patient has history of stroke. EXAM: CT HEAD WITHOUT CONTRAST TECHNIQUE: Contiguous axial images were obtained from the base of the skull through the vertex without intravenous contrast. COMPARISON:  05/13/2016 FINDINGS: Hypodense segment of the medial RIGHT cerebellar lobe is not changed from comparison exam. Note new sites of infarction. No intracranial hemorrhage.  No extra-axial fluid collections. Basilar cisterns are patent.  No hydrocephalus. Mild periventricular white matter hypodensities. IMPRESSION: 1. Expected evolution of subacute infarction in the RIGHT cerebellum. 2. No evidence of new infarction or hemorrhage. 3. No new mass effect. Electronically Signed   By: Genevive BiStewart  Edmunds M.D.   On: 05/17/2016 15:31   Mr Brain Ltd W/o Cm  05/17/2016  CLINICAL DATA:  Cerebellar infarct last week. Sudden onset vertigo with nausea and vomiting today. Concern for new or expanding infarct. EXAM: MRI HEAD WITHOUT CONTRAST TECHNIQUE: Multiplanar, multiecho pulse sequences of the brain and surrounding structures were obtained without intravenous contrast. COMPARISON:  Head CT 05/17/2016 and MRI 05/10/2016 FINDINGS: Only axial and coronal diffusion and axial FLAIR imaging was performed at the request of the ordering neurologist. Right larger than left PICA territory cerebellar infarcts demonstrate expected interval evolution. ADC appears essentially normal in these areas now with residual but mildly improved trace diffusion signal abnormality.  There is associated cytotoxic edema with similar, mild mass effect on the fourth ventricle. There is no evidence of hydrocephalus. No new infarct is identified. No parenchymal hematoma is identified on this limited study. There is no midline shift or extra-axial fluid collection. Mild cerebral white matter disease is unchanged. IMPRESSION: Expected evolution of now subacute cerebellar infarcts. No evidence of infarct extension or new infarct. Electronically Signed   By: Sebastian AcheAllen  Grady M.D.   On: 05/17/2016 18:54    Labs:  Basic Metabolic Panel:  Recent Labs Lab 05/12/16 1035 05/15/16 0612 05/16/16 0430  NA 141  --  142  K 3.5  --  3.9  CL 106  --  107  CO2 23  --  26  GLUCOSE 120*  --  88  BUN 13  --  15  CREATININE 0.94 0.90 0.98  CALCIUM 9.1  --  8.9    CBC:  Recent Labs Lab 05/12/16 1035 05/16/16 0430  WBC 7.0 7.7  NEUTROABS 4.4 4.3  HGB 17.2* 15.6  HCT 49.7 45.9  MCV 94.0 95.8  PLT 195 205    CBG: No results for input(s): GLUCAP in the last 168 hours.  Brief HPI:   [name] is a 65 y.o. male in relatively good health who was admitted on 05/09 with sudden onset of dizziness, N/V and weakness. CT head negative and MRI brain aborted due to nausea and abdominal discomfort. Neurology evaluated patient and he was found to have no focal deficits except nystagmus on right lateral gaze. He has had significant vertigo was treated for dizziness. MRI brain done today showing large area of acute infarct in right cerebellar infarct involving most of PICA on right and small areas of acute infarct in left inferior cerebellum with mild mass effect on  4th ventricle. 2D echo with EF 55-60% with grade 1 diastolic dysfunction and mild MVR. Bilateral PICA infarcts felt to be embolic of unknown etiology and patient on ASA for secondary stroke prevention.   He has had complaints of BLE pain and BLE dopplers done 04/12--negative for DVT. Intractable N/V is resolving and he is tolerating  regular diet. Will need outpatient 30 day loop monitor to rule out Afib as cause of stroke. Vomiting has resolved with improvement in po intake and nausea now limited with certain movements. Patient with resultant dizziness, gaze instability and balance deficits affecting mobility. CIR recommended for follow up therapy.   Hospital Course: [name] was admitted to rehab 05/15/2016 for inpatient therapies to consist of PT, ST and OT at least three hours five days a week. Past admission physiatrist, therapy team and rehab RN have worked together to provide customized collaborative inpatient rehab.  Nausea had resolved and po intake has improved.  Vestibular evaluation reveled significant motion sensitivity, central vertigo and impaired visual-vestibular interaction. He has been educated on compensatory strategies and VOR  to help with habituation.   Blood pressures have been well controlled and BB was held to allow for adequate perfusion and to prevent orthostatic symptoms. He did have   He had acute onset of vertigo with N/V on 05/18.  CT head and MRI brain were negative for extension or hemorrhagic conversion.  Dr. Roda Shutters was consulted for input and felt that recurrent symptoms were due to fatigue and/or anxiety.  His symptoms had resolved by that evening and he was educated on pacing his activity thorough out the day.  He has made good progress during his stay and is modified independent at discharge. He will continue to receive follow up outpatient PT at [facility] outpatient  Rehab after discharge.   Rehab course: During patient's stay in rehab weekly team conferences were held to monitor patient's progress, set goals and discuss barriers to discharge. At admission, patient required min assist with mobility and supervision with basic self care task. He has had improvement in activity tolerance, balance, postural control, as well as ability to compensate for deficits.  He is able to complete ADL tasks  independently. He is modified independent for ambulating in home and needs supervision in community setting.    Disposition:  Home  Diet: Regular.  Special Instructions: 1. No driving. No strenuous  activity.  2. Drink plenty of fluids.       Discharge Instructions    Ambulatory referral to Physical Medicine Rehab    Complete by:  As directed   1-2 week follow up post cerebellar stroke/moderate complexity.            Medication List    STOP taking these medications        metoprolol tartrate 25 MG tablet  Commonly known as:  LOPRESSOR      TAKE these medications        aspirin 325 MG EC tablet  Take 1 tablet (325 mg total) by mouth daily.     atorvastatin 40 MG tablet  Commonly known as:  LIPITOR  Take 1 tablet (40 mg total) by mouth daily at 6 PM.     diphenhydrAMINE 25 mg capsule  Commonly known as:  BENADRYL  Take 1 capsule (25 mg total) by mouth at bedtime as needed for sleep.     meclizine 12.5 MG tablet  Commonly known as:  ANTIVERT  Take 1 tablet (12.5 mg total) by mouth 3 (three)  times daily as needed for dizziness.       Follow-up Information    Follow up with [name], MD.   Specialty:  Physical Medicine and Rehabilitation   Why:  office will call you with follow up appointment   Contact information:   [address] [place] [address] [phone]       Follow up with Xu,Jindong, MD. Schedule an appointment as soon as possible for a visit in 2 months.   Specialty:  Neurology   Contact information:   [address] [place] [address] [phone]       Follow up with Lance Muss, MD On 06/28/2016.   Specialties:  Cardiology, Radiology, Interventional Cardiology   Why:  appointment at 9 am   Contact information:   [address] [place] [address] [phone]       Follow up with Georgianne Fick, MD On 05/30/2016.   Specialty:  Internal Medicine   Why:  12 noon    Contact information:   [address] [place] [address] [phone]       Signed: Jacquelynn Cree 05/18/2016, 3:06 PM

## 2016-05-18 NOTE — Progress Notes (Signed)
Subjective/Complaints: Episode of dizziness yesterday, CT of the had no new lesions, neurology evaluation appreciated. MRI of the brain showed no new CVA Patient feels better today. Still occasionally dizzy when he stands  Review of systems negative for chest pain, shortness of breath, nausea, vomiting, diarrhea, constipation  Objective: Vital Signs: Blood pressure 117/77, pulse 65, temperature 97.9 F (36.6 C), temperature source Oral, resp. rate 18, height _0  (1.854 m), weight 89.359 kg (197 lb), SpO2 98 %. Ct Head Wo Contrast  05/17/2016  CLINICAL DATA:  48 YOM. Patient unable to keep eyes open, nauseous and dizzy. Patient has history of stroke. EXAM: CT HEAD WITHOUT CONTRAST TECHNIQUE: Contiguous axial images were obtained from the base of the skull through the vertex without intravenous contrast. COMPARISON:  05/13/2016 FINDINGS: Hypodense segment of the medial RIGHT cerebellar lobe is not changed from comparison exam. Note new sites of infarction. No intracranial hemorrhage.  No extra-axial fluid collections. Basilar cisterns are patent.  No hydrocephalus. Mild periventricular white matter hypodensities. IMPRESSION: 1. Expected evolution of subacute infarction in the RIGHT cerebellum. 2. No evidence of new infarction or hemorrhage. 3. No new mass effect. Electronically Signed   By: Suzy Bouchard M.D.   On: 05/17/2016 15:31   Mr Brain Ltd W/o Cm  05/17/2016  CLINICAL DATA:  Cerebellar infarct last week. Sudden onset vertigo with nausea and vomiting today. Concern for new or expanding infarct. EXAM: MRI HEAD WITHOUT CONTRAST TECHNIQUE: Multiplanar, multiecho pulse sequences of the brain and surrounding structures were obtained without intravenous contrast. COMPARISON:  Head CT 05/17/2016 and MRI 05/10/2016 FINDINGS: Only axial and coronal diffusion and axial FLAIR imaging was performed at the request of the ordering neurologist. Right larger than left PICA territory cerebellar infarcts  demonstrate expected interval evolution. ADC appears essentially normal in these areas now with residual but mildly improved trace diffusion signal abnormality. There is associated cytotoxic edema with similar, mild mass effect on the fourth ventricle. There is no evidence of hydrocephalus. No new infarct is identified. No parenchymal hematoma is identified on this limited study. There is no midline shift or extra-axial fluid collection. Mild cerebral white matter disease is unchanged. IMPRESSION: Expected evolution of now subacute cerebellar infarcts. No evidence of infarct extension or new infarct. Electronically Signed   By: Logan Bores M.D.   On: 05/17/2016 18:54   Results for orders placed or performed during the hospital encounter of 05/15/16 (from the past 72 hour(s))  CBC WITH DIFFERENTIAL     Status: None   Collection Time: 05/16/16  4:30 AM  Result Value Ref Range   WBC 7.7 4.0 - 10.5 K/uL   RBC 4.79 4.22 - 5.81 MIL/uL   Hemoglobin 15.6 13.0 - 17.0 g/dL   HCT 45.9 39.0 - 52.0 %   MCV 95.8 78.0 - 100.0 fL   MCH 32.6 26.0 - 34.0 pg   MCHC 34.0 30.0 - 36.0 g/dL   RDW 12.8 11.5 - 15.5 %   Platelets 205 150 - 400 K/uL   Neutrophils Relative % 56 %   Neutro Abs 4.3 1.7 - 7.7 K/uL   Lymphocytes Relative 28 %   Lymphs Abs 2.1 0.7 - 4.0 K/uL   Monocytes Relative 12 %   Monocytes Absolute 1.0 0.1 - 1.0 K/uL   Eosinophils Relative 4 %   Eosinophils Absolute 0.3 0.0 - 0.7 K/uL   Basophils Relative 0 %   Basophils Absolute 0.0 0.0 - 0.1 K/uL  Comprehensive metabolic panel     Status:  Abnormal   Collection Time: 05/16/16  4:30 AM  Result Value Ref Range   Sodium 142 135 - 145 mmol/L   Potassium 3.9 3.5 - 5.1 mmol/L   Chloride 107 101 - 111 mmol/L   CO2 26 22 - 32 mmol/L   Glucose, Bld 88 65 - 99 mg/dL   BUN 15 6 - 20 mg/dL   Creatinine, Ser 0.98 0.61 - 1.24 mg/dL   Calcium 8.9 8.9 - 10.3 mg/dL   Total Protein 6.0 (L) 6.5 - 8.1 g/dL   Albumin 3.1 (L) 3.5 - 5.0 g/dL   AST 24 15 - 41  U/L   ALT 28 17 - 63 U/L   Alkaline Phosphatase 67 38 - 126 U/L   Total Bilirubin 0.7 0.3 - 1.2 mg/dL   GFR calc non Af Amer >60 >60 mL/min   GFR calc Af Amer >60 >60 mL/min    Comment: (NOTE) The eGFR has been calculated using the CKD EPI equation. This calculation has not been validated in all clinical situations. eGFR's persistently <60 mL/min signify possible Chronic Kidney Disease.    Anion gap 9 5 - 15     HEENT: normal Cardio: RRR and no murmur Resp: CTA B/L and unlabored GI: BS positive and NT, ND Extremity:  No Edema Skin:   Intact Neuro: Alert/Oriented, Normal Sensory , Abnormal FMC Ataxic/ dec FMC and Dysarthric Musc/Skel:  Normal Gen NAD Ambulates with standby assistance widened base of support.  Assessment/Plan: 1. Functional deficits secondary to Trunkal ataxia bilateral cerebellar  Stable from a functional standpoint. Should be able to go home today but will check with neurology whether they want to hold for additional 24 hours. We will want to see the patient back in approximately 2 weeks. Refer to discharge instructions FIM: Function - Bathing Position: Shower Body parts bathed by patient: Right arm, Left arm, Chest, Abdomen, Front perineal area, Buttocks, Right upper leg, Left upper leg, Right lower leg, Left lower leg, Back Assist Level: No help, No cues Set up : To obtain items  Function- Upper Body Dressing/Undressing What is the patient wearing?: Pull over shirt/dress Pull over shirt/dress - Perfomed by patient: Thread/unthread right sleeve, Thread/unthread left sleeve, Put head through opening, Pull shirt over trunk Assist Level: No help, No cues Set up : To obtain clothing/put away Function - Lower Body Dressing/Undressing What is the patient wearing?: Pants, Shoes Position:  (recliner) Pants- Performed by patient: Thread/unthread right pants leg, Thread/unthread left pants leg, Pull pants up/down Non-skid slipper socks- Performed by patient:  Don/doff right sock, Don/doff left sock Socks - Performed by patient: Don/doff right sock, Don/doff left sock Shoes - Performed by patient: Don/doff right shoe, Don/doff left shoe Assist for footwear: Independent Assist for lower body dressing: No Help, No cues  Function - Toileting Toileting steps completed by patient: Adjust clothing prior to toileting, Performs perineal hygiene, Adjust clothing after toileting Assist level: Supervision or verbal cues  Function - Air cabin crew transfer assistive device: Walker Assist level to toilet: Supervision or verbal cues Assist level from toilet: Supervision or verbal cues  Function - Chair/bed transfer Chair/bed transfer assist level: No Help, no cues, assistive device, takes more than a reasonable amount of time Chair/bed transfer assistive device: Walker Chair/bed transfer details: Verbal cues for precautions/safety, Verbal cues for technique  Function - Locomotion: Wheelchair Will patient use wheelchair at discharge?: No Function - Locomotion: Ambulation Assistive device: No device Max distance: 200 Assist level: No help, No cues, assistive device, takes  more than a reasonable amount of time Assist level: No help, No cues, assistive device, takes more than a reasonable amount of time Assist level: No help, No cues, assistive device, takes more than a reasonable amount of time Assist level: No help, No cues, assistive device, takes more than a reasonable amount of time Assist level: No help, No cues, assistive device, takes more than a reasonable amount of time  Function - Comprehension Comprehension: Auditory Comprehension assist level: Follows complex conversation/direction with no assist  Function - Expression Expression: Verbal Expression assist level: Expresses complex ideas: With no assist  Function - Social Interaction Social Interaction assist level: Interacts appropriately with others - No medications  needed.  Function - Problem Solving Problem solving assist level: Solves complex problems: Recognizes & self-corrects  Function - Memory Memory assist level: Complete Independence: No helper Patient normally able to recall (first 3 days only): Current season, Location of own room, Staff names and faces, That he or she is in a hospital   Medical Problem List and Plan: 1.  Truncal ataxia/balance and functional deficits secondary to bilateral cerebellar infarcts- plan DC today unless neuro-wishes to hold, discussed with patient and his wife. 2.  DVT Prophylaxis/Anticoagulation: Pharmaceutical: Lovenox, Ambulation distance improving we'll discontinue 3. Pain Management: N/A 4. Mood: Very motivated.  LCSW to follow for evaluation and support.   5. Neuropsych: This patient is capable of making decisions on his own behalf. Safety precautions emphasized.   6. Skin/Wound Care: Maintain adequate  Nutrition and hydration.   7. Fluids/Electrolytes/Nutrition: Monitor I/O. Check lytes in am. Offer supplements between meals prn  Poor intake.   8. Grade 1 diastolic dysfunction/Blood pressure: Metoprolol added but has had episodes of bradycardia as well as hypotension--permissive HTN to allow for adequate perfusion.     9. Dyslipidemia: On lipitor.   LOS (Days) 3 A FACE TO FACE EVALUATION WAS PERFORMED  Lyndsee Casa E 05/18/2016, 9:28 AM

## 2016-05-18 NOTE — Progress Notes (Signed)
Social Work Discharge Note  The overall goal for the admission was met for:   Discharge location: Yes - home  Length of Stay: Yes - 3 days  Discharge activity level: Yes - modified independent with supervision for showering and community ambulation  Home/community participation: Yes  Services provided included: MD, RD, PT, OT, RN, Pharmacy and SW  Financial Services: Medicare and Private Insurance: [place]  Follow-up services arranged: Outpatient: PT for vestibular program, DME: shower seat recommended - want to wait on this until they get home and Patient/Family request agency HH: [facility], DME: none at this time  Comments (or additional information):  Patient/Family verbalized understanding of follow-up arrangements: Yes  Individual responsible for coordination of the follow-up plan: pt with wife for supervision as needed  Confirmed correct DME delivered: Trey Sailors 05/18/2016    Reyaan Thoma, Silvestre Mesi

## 2016-05-18 NOTE — Progress Notes (Signed)
Occupational Therapy Discharge Summary  Patient Details  Name: [name] MRN: [id number] Date of Birth: Jul 20, 1951  Patient has met 8 of 8 long term goals due to improved activity tolerance, improved balance, ability to compensate for deficits and improved awareness.  Patient to discharge at overall Modified Independent level, supervision for tub/shower transfers.  Patient's care partner is independent to provide the necessary assistance at discharge.    Reasons goals not met: N/A  Recommendation:  Patient will benefit from follow up Vestibular rehab to address vestibular symptoms.  No OT follow up recommended at this time.  Equipment: shower seat  Reasons for discharge: treatment goals met and discharge from hospital  Patient/family agrees with progress made and goals achieved: Yes  OT Discharge Precautions/Restrictions  Precautions Precautions: Fall Precaution Comments: vertigo General   Vital Signs Therapy Vitals Temp: 97.9 F (36.6 C) Temp Source: Oral Pulse Rate: 65 Resp: 18 BP: 117/77 mmHg Patient Position (if appropriate): Lying Oxygen Therapy SpO2: 98 % O2 Device: Not Delivered Pain Pain Assessment Pain Assessment: No/denies pain ADL See Function Navigator Vision/Perception  Vision- History Baseline Vision/History: Wears glasses Wears Glasses: Reading only;Distance only Patient Visual Report: Blurring of vision Vision- Assessment Vision Assessment?: Yes Eye Alignment: Within Functional Limits Ocular Range of Motion: Within Functional Limits Alignment/Gaze Preference: Within Defined Limits Tracking/Visual Pursuits: Decreased smoothness of vertical tracking;Decreased smoothness of horizontal tracking Additional Comments: vertigo with head turns and looking down to floor  Cognition Overall Cognitive Status: Within Functional Limits for tasks assessed Arousal/Alertness: Awake/alert Orientation Level: Oriented X4 Attention: Alternating Alternating  Attention: Appears intact Memory: Appears intact Awareness: Appears intact Problem Solving: Appears intact Safety/Judgment: Appears intact Sensation Sensation Light Touch: Appears Intact Stereognosis: Not tested Hot/Cold: Not tested Proprioception: Appears Intact Coordination Gross Motor Movements are Fluid and Coordinated: Yes Fine Motor Movements are Fluid and Coordinated: Yes Extremity/Trunk Assessment RUE Assessment RUE Assessment: Within Functional Limits LUE Assessment LUE Assessment: Within Functional Limits   See Function Navigator for Current Functional Status.  [name] 05/18/2016, 2:26 PM

## 2016-05-18 NOTE — IPOC Note (Addendum)
Overall Plan of Care [facility](IPOC) Patient Details Name: [name] MRN: [id number] DOB: 10/09/1951  Admitting Diagnosis: B  CVA  Hospital Problems: Principal Problem:   Cerebellar stroke (HCC) Active Problems:   Vertigo   Nausea without vomiting   HLD (hyperlipidemia)     Functional Problem List: Nursing Endurance, Medication Management, Motor, Safety, Pain, Behavior, Bladder, Bowel  PT Balance, Endurance, Motor, Safety  OT Balance, Endurance, Vision  SLP    TR         Basic ADL's: OT Bathing, Dressing, Toileting     Advanced  ADL's: OT Light Housekeeping     Transfers: PT Bed Mobility, Bed to Chair, Car, Floor, Occupational psychologisturniture  OT Toilet, Tub/Shower     Locomotion: PT Ambulation, Stairs     Additional Impairments: OT None  SLP        TR      Anticipated Outcomes Item Anticipated Outcome  Self Feeding I  Swallowing      Basic self-care  mod I  Toileting  mod I   Bathroom Transfers mod I to toilet, S to bathtub  Bowel/Bladder  Mod I  Transfers  modI  Locomotion  mod I in home, S in community  Communication     Cognition     Pain  less 2  Safety/Judgment  Mod I   Therapy Plan: PT Intensity: Minimum of 1-2 x/day ,45 to 90 minutes PT Frequency: 5 out of 7 days PT Duration Estimated Length of Stay: 3-5 days OT Intensity: Minimum of 1-2 x/day, 45 to 90 minutes OT Frequency: 5 out of 7 days OT Duration/Estimated Length of Stay: 3-5 days         Team Interventions: Nursing Interventions Patient/Family Education, Disease Management/Prevention, Medication Management, Discharge Planning, Psychosocial Support  PT interventions Ambulation/gait training, Warden/rangerBalance/vestibular training, Community reintegration, Discharge planning, Functional mobility training, Neuromuscular re-education, UE/LE Coordination activities, UE/LE Strength taining/ROM, Therapeutic Exercise, Therapeutic Activities, Stair training, Visual/perceptual remediation/compensation, Patient/family  education, Disease management/prevention  OT Interventions Balance/vestibular training, Visual/perceptual remediation/compensation, Discharge planning, Functional mobility training, Self Care/advanced ADL retraining, Therapeutic Activities, Therapeutic Exercise  SLP Interventions    TR Interventions    SW/CM Interventions Discharge Planning, Psychosocial Support, Patient/Family Education    Team Discharge Planning: Destination: PT-Home ,OT- Home , SLP-  Projected Follow-up: PT-Outpatient PT, OT-  Outpatient OT, SLP-  Projected Equipment Needs: PT-None recommended by PT, OT- Tub/shower seat, SLP-  Equipment Details: PT- , OT-  Patient/family involved in discharge planning: PT- Patient,  OT-Patient, SLP-   MD ELOS: 5-7d Medical Rehab Prognosis:  Good Assessment: 65 y.o. male in relatively good health who was admitted on 05/09 with sudden onset of dizziness, N/V and weakness. CT head negative and MRI brain aborted due to nausea and abdominal discomfort. Neurology evaluated patient and he was found to have no focal deficits except nystagmus on right lateral gaze. He has had significant vertigo was treated for dizziness. MRI brain done today showing large area of acute infarct in right cerebellar infarct involving most of PICA on right and small areas of acute infarct in left inferior cerebellum with mild mass effect on 4th ventricle. 2D echo with EF 55-60% with grade 1 diastolic dysfunction and mild MVR. Bilateral PICA infarcts felt to be embolic of unknown etiology and patient on ASA for secondary stroke prevention.  Now requiring 24/7 Rehab RN,MD, as well as CIR level PT, OT   Treatment team will focus on ADLs and mobility [facility]   See Team Conference  Notes for weekly updates to the plan of care

## 2016-05-18 NOTE — Discharge Instructions (Signed)
Inpatient Rehab Discharge Instructions  [name] Discharge date and time:  05/18/16  Activities/Precautions/ Functional Status: Activity: No lifting, driving, or strenuous exercise till cleared by MD.  Diet: cardiac diet Wound Care: keep wound clean and dry   Functional status:  ___ No restrictions     ___ Walk up steps independently ___ 24/7 supervision/assistance   ___ Walk up steps with assistance _X__ Intermittent supervision/assistance  _X__ Bathe/dress independently ___ Walk with walker     ___ Bathe/dress with assistance ___ Walk Independently    ___ Shower independently ___ Walk with assistance    ___ Shower with assistance _X__ No alcohol     ___ Return to work/school ________    COMMUNITY REFERRALS UPON DISCHARGE:    Outpatient: PT  [facility] Phone:  [phone]   Appointment Date/Time:  Monday, May 22 at 3 PM (be there at 2:30 PM) Medical Equipment/Items Ordered: tub seat recommended, but you wanted to wait until you get home   GENERAL COMMUNITY RESOURCES FOR PATIENT/FAMILY: Support Groups:  [facility]                              Meets the 2nd Thursday of every month at 3 PM (except for June, July, and August)                              In the dayroom [facility]  Special Instructions:    STROKE/TIA DISCHARGE INSTRUCTIONS SMOKING Cigarette smoking nearly doubles your risk of having a stroke & is the single most alterable risk factor  If you smoke or have smoked in the last 12 months, you are advised to quit smoking for your health.  Most of the excess cardiovascular risk related to smoking disappears within a year of stopping.  Ask you doctor about anti-smoking medications   Quit Line: 1-800-QUIT NOW  Free Smoking Cessation Classes [phone]  CHOLESTEROL Know your levels; limit fat & cholesterol in your diet  Lipid Panel     Component Value  Date/Time   CHOL 165 05/09/2016 0042   TRIG 34 05/09/2016 0042   HDL 41 05/09/2016 0042   CHOLHDL 4.0 05/09/2016 0042   VLDL 7 05/09/2016 0042   LDLCALC 117* 05/09/2016 0042      Many patients benefit from treatment even if their cholesterol is at goal.  Goal: Total Cholesterol (CHOL) less than 160  Goal:  Triglycerides (TRIG) less than 150  Goal:  HDL greater than 40  Goal:  LDL (LDLCALC) less than 100   BLOOD PRESSURE American Stroke Association blood pressure target is less that 120/80 mm/Hg  Your discharge blood pressure is:  BP: 117/84 mmHg  Monitor your blood pressure  Limit your salt and alcohol intake  Many individuals will require more than one medication for high blood pressure  DIABETES (A1c is a blood sugar average for last 3 months) Goal HGBA1c is under 7% (HBGA1c is blood sugar average for last 3 months)  Diabetes:     Lab Results  Component Value Date   HGBA1C 5.3 05/09/2016     Your HGBA1c can be lowered with medications, healthy diet, and exercise.  Check your blood sugar as directed by your physician  Call your physician if you experience unexplained or low blood sugars.  PHYSICAL ACTIVITY/REHABILITATION Goal is 30 minutes  at least 4 days per week  Activity: No driving, Therapies: See above Return to work: to be decided on follow up  Activity decreases your risk of heart attack and stroke and makes your heart stronger.  It helps control your weight and blood pressure; helps you relax and can improve your mood.  Participate in a regular exercise program.  Talk with your doctor about the best form of exercise for you (dancing, walking, swimming, cycling).  DIET/WEIGHT Goal is to maintain a healthy weight  Your discharge diet is: Diet Heart Room service appropriate?: Yes; Fluid consistency:: Thin liquids Your height is:  Height:  (185.4 cm) Your current weight is: Weight: 89.359 kg (197 lb) Your Body Mass Index (BMI) is:  BMI (Calculated): 26   Following the type of diet specifically designed for you will help prevent another stroke.  Your goal weight is: 189 lbs  Your goal Body Mass Index (BMI) is 19-24.  Healthy food habits can help reduce 3 risk factors for stroke:  High cholesterol, hypertension, and excess weight.  RESOURCES Stroke/Support Group:  Call [phone]   STROKE EDUCATION PROVIDED/REVIEWED AND GIVEN TO PATIENT Stroke warning signs and symptoms How to activate emergency medical system (call 911). Medications prescribed at discharge. Need for follow-up after discharge. Personal risk factors for stroke. Pneumonia vaccine given:  Flu vaccine given:  My questions have been answered, the writing is legible, and I understand these instructions.  I will adhere to these goals & educational materials that have been provided to me after my discharge from the hospital.      My questions have been answered and I understand these instructions. I will adhere to these goals and the provided educational materials after my discharge from the hospital.  Patient/Caregiver Signature _______________________________ Date __________  Clinician Signature _______________________________________ Date __________  Please bring this form and your medication list with you to all your follow-up doctor's appointments.

## 2016-05-18 NOTE — Progress Notes (Signed)
Occupational Therapy Session Note  Patient Details  Name: Voncille LoJohnny Migliaccio MRN: [id number] Date of Birth: 12/16/1951  Today's Date: 05/18/2016 OT Individual Time: 1100-1130 OT Individual Time Calculation (min): 30 min    Short Term Goals: Week 1:  OT Short Term Goal 1 (Week 1): STGs = LTGs No short term goals set  Skilled Therapeutic Interventions/Progress Updates:    Pt seen this session for family ed with pt and spouse on HEP to challenge his balance and for generalized strengthening. Pt provided with theraband and printed handouts. Practiced all exercises and stretches. Pt feels prepared for discharge home today.   Therapy Documentation Precautions:  Precautions Precautions: Fall Precaution Comments: vertigo Restrictions Weight Bearing Restrictions: No   Pain: Pain Assessment Pain Assessment: No/denies pain ADL: ADL ADL Comments: S with ADLs and ADL transfers  See Function Navigator for Current Functional Status.   Therapy/Group: Individual Therapy  Hever Castilleja 05/18/2016, 12:40 PM

## 2016-05-18 NOTE — Progress Notes (Signed)
STROKE TEAM PROGRESS NOTE   SUBJECTIVE (INTERVAL HISTORY) His wife is at the bedside.  Pt doing well this am. Had MRI last night and showed no new infarcts. Plan for d/c today.   OBJECTIVE Temp:  [97.5 F (36.4 C)-97.9 F (36.6 C)] 97.9 F (36.6 C) (05/19 1610) Pulse Rate:  [65-77] 65 (05/19 0632) Cardiac Rhythm:  [-]  Resp:  [18-22] 18 (05/19 0632) BP: (115-133)/(66-82) 117/77 mmHg (05/19 0632) SpO2:  [98 %-100 %] 98 % (05/19 0632)  CBC:   Recent Labs Lab 05/12/16 1035 05/16/16 0430  WBC 7.0 7.7  NEUTROABS 4.4 4.3  HGB 17.2* 15.6  HCT 49.7 45.9  MCV 94.0 95.8  PLT 195 205    Basic Metabolic Panel:   Recent Labs Lab 05/12/16 1035 05/15/16 0612 05/16/16 0430  NA 141  --  142  K 3.5  --  3.9  CL 106  --  107  CO2 23  --  26  GLUCOSE 120*  --  88  BUN 13  --  15  CREATININE 0.94 0.90 0.98  CALCIUM 9.1  --  8.9    Lipid Panel:     Component Value Date/Time   CHOL 165 05/09/2016 0042   TRIG 34 05/09/2016 0042   HDL 41 05/09/2016 0042   CHOLHDL 4.0 05/09/2016 0042   VLDL 7 05/09/2016 0042   LDLCALC 117* 05/09/2016 0042   HgbA1c:  Lab Results  Component Value Date   HGBA1C 5.3 05/09/2016   Urine Drug Screen:     Component Value Date/Time   LABOPIA NONE DETECTED 05/08/2016 2040   COCAINSCRNUR NONE DETECTED 05/08/2016 2040   LABBENZ NONE DETECTED 05/08/2016 2040   AMPHETMU NONE DETECTED 05/08/2016 2040   THCU NONE DETECTED 05/08/2016 2040   LABBARB NONE DETECTED 05/08/2016 2040      IMAGING I have personally reviewed the radiological images below and agree with the radiology interpretations.  Ct Head Wo Contrast 05/08/2016   Mildly limited examination demonstrating mild atrophy with no acute abnormality.   Mr Brain Wo Contrast 05/10/2016   Large acute RIGHT cerebellar infarct, nonhemorrhagic, PICA territory. Much smaller infarct of the LEFT medial inferior cerebellum, also PICA territory. Mild mass effect on the fourth ventricle. The patient  should be observed for signs of developing obstructive hydrocephalus. A call is in to the care team.   CTA head ane neck - Acute PICA infarct right greater than left. There appears to be decreased flow in the right PICA which may be related to the acute infarct and partial occlusion. No filling defect or thrombus identified. No vertebral artery stenosis or dissection is identified as a cause of the infarct. No significant carotid or vertebral artery stenosis in the neck. Negative for dissection.  TTE - - Left ventricle: The cavity size was normal. Wall thickness was  increased increased in a pattern of mild to moderate LVH.  Systolic function was normal. The estimated ejection fraction was  in the range of 55% to 60%. Wall motion was normal; there were no  regional wall motion abnormalities. Doppler parameters are  consistent with abnormal left ventricular relaxation (grade 1  diastolic dysfunction). - Mitral valve: There was mild regurgitation. - Left atrium: The atrium was mildly dilated. Impressions: - No cardiac source of emboli was indentified.  LE venous doppler - Bilateral: No evidence of DVT, superficial thrombosis, or Baker's Cyst.  Ct Head Wo Contrast 05/17/2016  IMPRESSION: 1. Expected evolution of subacute infarction in the RIGHT cerebellum. 2. No evidence  of new infarction or hemorrhage. 3. No new mass effect.  Mr Brain Ltd W/o Cm 05/17/2016  IMPRESSION: Expected evolution of now subacute cerebellar infarcts. No evidence of infarct extension or new infarct.    PHYSICAL EXAM  Temp:  [97.5 F (36.4 C)-97.9 F (36.6 C)] 97.9 F (36.6 C) (05/19 1610) Pulse Rate:  [65-77] 65 (05/19 0632) Resp:  [18-22] 18 (05/19 0632) BP: (115-133)/(66-82) 117/77 mmHg (05/19 0632) SpO2:  [98 %-100 %] 98 % (05/19 9604)  General - Well nourished, well developed, in no apparent distress.  Ophthalmologic - Fundi not visualized due to noncooperation.  Cardiovascular - Regular  rate and rhythm with no murmur.  Mental Status -  Level of arousal and orientation to time, place, and person were intact. Language including expression, naming, repetition, comprehension was assessed and found intact. Fund of Knowledge was assessed and was intact.  Cranial Nerves II - XII - II - Visual field intact OU. III, IV, VI - Extraocular movements intact. V - Facial sensation intact bilaterally. VII - Facial movement intact bilaterally. VIII - Hearing & vestibular intact bilaterally, no nystagmus.  X - Palate elevates symmetrically. XI - Chin turning & shoulder shrug intact bilaterally. XII - Tongue protrusion intact.  Motor Strength - The patient's strength was normal in all extremities and pronator drift was absent.  Bulk was normal and fasciculations were absent.   Motor Tone - Muscle tone was assessed at the neck and appendages and was normal.  Reflexes - The patient's reflexes were 1+ in all extremities and he had no pathological reflexes.  Sensory - Light touch, temperature/pinprick were assessed and were symmetrical.    Coordination - The patient had normal movements in the hands and legs with no ataxia or dysmetria. Tremor was absent.  Gait and Station - slow and cautious gait, but normal stride and no ataxia.    ASSESSMENT/PLAN Mr. Lenford Beddow is a 65 y.o. male with no significant past medical history presenting with sudden onset vertigo and nausea. He did not receive IV t-PA.   Likely recrudescence of previous stroke - will do MRI to rule out new stroke  CT no new findings  Head thrust test negative indicating central vertigo  Pt had extensive work up today and feeling tired with anxiety going home  MRI brain limited study showed no new infarcts  Recommend gradual rehab schedule  Cope with stress and anxiety  Stroke:  bilateral PICA infarct, R>L, consistent with right PICA occlusion or high grade stenosis.  Resultant  Dizziness, nausea  MRI   bilateral PICA infarct, R>L  CTA head and neck possible right PICA occlusion vs. High grade stenosis, no VA dissection or stenosis   Repeat CT stable no hydrocephalus  2D Echo  Unremarkable  LE venous doppler no DVT  LDL 117  HgbA1c 5.3  Lovenox 40 mg sq daily for VTE prophylaxis  Diet Heart Room service appropriate?: Yes; Fluid consistency:: Thin. On thin liquid diet given nausea.  No antithrombotic prior to admission, now on aspirin 325 mg daily.   Patient counseled to be compliant with his antithrombotic medications  Ongoing aggressive stroke risk factor management  Hyperlipidemia  Home meds:  No statin  LDL 117, goal < 70  Now on lipitor 40 mg daily  Continue statin at discharge  Other Stroke Risk Factors  Advanced age  Hospital day # 3  Neurology will sign off. Please call with questions. Pt will follow up with Dr. Roda Shutters at [facility] in about 2 months.  Thanks for the consult.   Marvel PlanJindong Leshon Armistead, MD PhD Stroke Neurology 05/18/2016 12:32 PM     To contact Stroke Continuity provider, please refer to [email or web address]. After hours, contact General Neurology

## 2016-05-18 NOTE — Progress Notes (Signed)
Physical Therapy Discharge Summary  Patient Details  Name: [name] MRN: [id number] Date of Birth: 06-22-1951  Today's Date: 05/18/2016 PT Individual Time: 0830-0930 PT Individual Time Calculation (min): 60 min    Patient has met 9 of 10 long term goals due to improved activity tolerance, improved balance, improved postural control, ability to compensate for deficits and improved coordination.  Patient to discharge at an ambulatory level Modified Independent.   Pt to d/c at mod I level in the home, S in community. Patient's wife participated in education session and is independent to provide the necessary physical assistance/supervision at discharge.  Reasons goals not met: Stair goal unmet; recommending pt have supervision for stairs  Recommendation:  Patient will benefit from ongoing skilled PT services in outpatient setting to continue to advance safe functional mobility, address ongoing impairments in vestibular, balance, activity tolerance, and minimize fall risk.  Equipment: No equipment provided  Reasons for discharge: treatment goals met  Patient/family agrees with progress made and goals achieved: Yes  PT Discharge Precautions/Restrictions Precautions Precautions: Fall Precaution Comments: vertigo Vital Signs Therapy Vitals Temp: 97.9 F (36.6 C) Temp Source: Oral Pulse Rate: 65 Resp: 18 BP: 117/77 mmHg Patient Position (if appropriate): Lying Oxygen Therapy SpO2: 98 % O2 Device: Not Delivered Pain Pain Assessment Pain Assessment: No/denies pain Vision/Perception  Vision - Assessment Eye Alignment: Within Functional Limits Ocular Range of Motion: Within Functional Limits Alignment/Gaze Preference: Within Defined Limits Tracking/Visual Pursuits: Decreased smoothness of vertical tracking;Decreased smoothness of horizontal tracking Additional Comments: vertigo with head turns and looking down to floor  Cognition Overall Cognitive Status: Within Functional  Limits for tasks assessed Arousal/Alertness: Awake/alert Orientation Level: Oriented X4 Attention: Alternating Alternating Attention: Appears intact Memory: Appears intact Awareness: Appears intact Problem Solving: Appears intact Safety/Judgment: Appears intact Sensation Sensation Light Touch: Appears Intact Stereognosis: Not tested Hot/Cold: Not tested Proprioception: Appears Intact Coordination Gross Motor Movements are Fluid and Coordinated: Yes Fine Motor Movements are Fluid and Coordinated: Yes Motor  Motor Motor: Within Functional Limits Motor - Discharge Observations: Mild strength deficits BLE  Mobility Bed Mobility Bed Mobility: Supine to Sit;Sit to Supine Supine to Sit: 7: Independent Sit to Supine: 7: Independent Transfers Transfers: Yes Stand Pivot Transfers: 6: Modified independent (Device/Increase time) Locomotion  Ambulation Ambulation: Yes Ambulation/Gait Assistance: 6: Modified independent (Device/Increase time) Ambulation Distance (Feet): 250 Feet Assistive device: None Gait Gait: Yes Gait Pattern: Impaired Gait Pattern: Shuffle;Decreased stride length Gait velocity: decreased for age/gender norms High Level Ambulation High Level Ambulation: Head turns;Direction changes Direction Changes: mod I, slow speed Head Turns: mod I, slow speed Stairs / Additional Locomotion Stairs: Yes Stairs Assistance: 5: Supervision Stairs Assistance Details: Verbal cues for technique;Verbal cues for precautions/safety Stair Management Technique: One rail Right;Alternating pattern;Forwards Number of Stairs: 12 Height of Stairs: 6 Ramp: 6: Modified independent (Device) Curb: 6: Modified independent (Device/increase time) Product manager Mobility: No  Trunk/Postural Assessment  Cervical Assessment Cervical Assessment: Within Functional Limits Thoracic Assessment Thoracic Assessment: Within Functional Limits Lumbar Assessment Lumbar Assessment:  Within Functional Limits Postural Control Postural Control: Deficits on evaluation (impaired stepping/righting reactions for balance recovery)  Balance Balance Balance Assessed: Yes Standardized Balance Assessment Standardized Balance Assessment: Berg Balance Test Berg Balance Test Sit to Stand: Able to stand without using hands and stabilize independently Standing Unsupported: Able to stand safely 2 minutes Sitting with Back Unsupported but Feet Supported on Floor or Stool: Able to sit safely and securely 2 minutes Stand to Sit: Sits safely with minimal use of hands Transfers:  Able to transfer safely, minor use of hands Standing Unsupported with Eyes Closed: Able to stand 10 seconds with supervision Standing Ubsupported with Feet Together: Able to place feet together independently and stand 1 minute safely From Standing, Reach Forward with Outstretched Arm: Can reach confidently >25 cm (10") From Standing Position, Pick up Object from Floor: Able to pick up shoe safely and easily From Standing Position, Turn to Look Behind Over each Shoulder: Looks behind from both sides and weight shifts well Turn 360 Degrees: Able to turn 360 degrees safely one side only in 4 seconds or less Standing Unsupported, Alternately Place Feet on Step/Stool: Able to stand independently and complete 8 steps >20 seconds Standing Unsupported, One Foot in Front: Able to place foot tandem independently and hold 30 seconds Standing on One Leg: Able to lift leg independently and hold equal to or more than 3 seconds Total Score: 51 Dynamic Gait Index Level Surface: Normal Change in Gait Speed: Mild Impairment Gait with Horizontal Head Turns: Mild Impairment Gait with Vertical Head Turns: Mild Impairment Gait and Pivot Turn: Mild Impairment Step Over Obstacle: Mild Impairment Step Around Obstacles: Mild Impairment Steps: Mild Impairment Total Score: 17 Timed Up and Go Test TUG: Normal TUG Normal TUG (seconds):  10 Cognitive TUG (seconds): 13.4 Static Sitting Balance Static Sitting - Balance Support: No upper extremity supported;Feet supported Static Sitting - Level of Assistance: 7: Independent Dynamic Sitting Balance Dynamic Sitting - Balance Support: No upper extremity supported;Feet supported Dynamic Sitting - Level of Assistance: 7: Independent Dynamic Sitting - Balance Activities: Lateral lean/weight shifting;Forward lean/weight shifting;Reaching for weighted objects;Reaching across midline Static Standing Balance Static Standing - Balance Support: No upper extremity supported Static Standing - Level of Assistance: 7: Independent Static Stance: Eyes closed Static Stance: Eyes Closed: x30 sec mod I Dynamic Standing Balance Dynamic Standing - Balance Support: No upper extremity supported Dynamic Standing - Level of Assistance: 6: Modified independent (Device/Increase time) Dynamic Standing - Balance Activities: Lateral lean/weight shifting;Forward lean/weight shifting;Reaching across midline Extremity Assessment  RUE Assessment RUE Assessment: Within Functional Limits LUE Assessment LUE Assessment: Within Functional Limits RLE Assessment RLE Assessment: Exceptions to [facility] (mild strength deficits 4+/5 throughout) LLE Assessment LLE Assessment: Exceptions to [facility] (mild strength deficits, 4+/5 throughout)  Skilled Therapeutic Intervention:  Pt received seated in recliner with wife present, denies pain and agreeable to treatment. Reports he "may have done too much yesterday; discussed energy conservation and gradual increase in resumption of activities after returning home. Assessed all mobility as described above with mod I overall. Reviewed vestibular exercises with pt and wife, including VOR x1 and x2 viewing, as well as appropriate progressions. Instructed pt in otago a/b/c exercises for continued strengthening and balance at home. Pt and wife with no additional questions or concerns regarding  d/c or home safety at this time. Remained seated in recliner, all needs within reach. Sign hung indicating pt mod I in room at this time.   See Function Navigator for Current Functional Status.  Benjiman Core Tygielski 05/18/2016, 12:28 PM

## 2016-05-18 NOTE — Progress Notes (Signed)
Social Work Assessment and Plan  Patient Details  Name: [name] MRN: [id number] Date of Birth: 09/13/1951  Today's Date: 05/17/2016  Problem List:  Patient Active Problem List   Diagnosis Date Noted  . HLD (hyperlipidemia)   . Nausea without vomiting 05/16/2016  . Cerebellar stroke ([place]) 05/15/2016  . Acute right PCA stroke ([place]) 05/10/2016  . Acute CVA (cerebrovascular accident) ([place]) 05/10/2016  . Emesis   . Vertigo 05/08/2016  . Intractable nausea and vomiting 05/08/2016   Past Medical History:  Past Medical History  Diagnosis Date  . Healthy adult    Past Surgical History: History reviewed. No pertinent past surgical history. Social History:  reports that he has never smoked. He has never used smokeless tobacco. He reports that he does not drink alcohol or use illicit drugs.  Family / Support Systems Marital Status: Married How Long?: 38 years Patient Roles: Spouse Spouse/Significant Other: [name] - wife - [phone] Children: [name] - dtr - [phone] Anticipated Caregiver: wife, [name] Ability/Limitations of Caregiver: none, wife works as a Oceanographer on a PRN basis but is available as much as needed for pt. 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  Safety precautions emphasized.   Abuse/Neglect Physical Abuse: Denies Verbal Abuse: Denies Sexual Abuse: Denies Exploitation of patient/patient's resources: Denies Self-Neglect: Denies  Emotional Status Pt's affect,  behavior and adjustment status: Pt is motivated to get well and reports feeling well emotionally, yet has been observed to be anxious at times. Recent Psychosocial Issues: none reported Psychiatric History: none reported Substance Abuse History: none reported  Patient / Family Perceptions, Expectations & Goals Pt/Family understanding of illness & functional limitations: Pt/family understand pt's condition and limitations and wife stated they were going to make some changes. 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 Anticipated changes in roles/activities/participation: Pt would like to resume these activites as he is able. He realizes it will be a while before he can drive. Pt/family expectations/goals: Pt wants to regain his independence and have his body back in working order.  Community Resources [employer] Scripts: None Premorbid Home Care/DME Agencies: None Transportation available at discharge: wife Resource referrals recommended: Support group (specify) ([place] [facility])  Discharge Planning Living Arrangements: Spouse/significant other Support Systems: Spouse/significant other, Children, Other relatives, Friends/neighbors Type of Residence: Private residence [employer] Resources: Multimedia programmer (specify), Medicare Printmaker) Museum/gallery curator Resources: Employment, Other (Comment) (wife's income/retirement) Financial Screen Referred: No Money Management: Patient, Spouse Does the patient have any problems obtaining your medications?: No Home Management: Pt and wife share these tasks.  Wife can manage the home while pt is recovering. Patient/Family Preliminary Plans: Pt to return home with his wife to provide supervision as needed. Barriers to Discharge: Steps Social Work Anticipated Follow Up Needs: HH/OP, Support Group Expected length of stay: 3-5 days  Clinical Impression CSW  met with pt and his wife to introduce self and role of CSW, as well as complete assessment.  Pt will be here for a short stay and feels  ready to go home.  Pt's wife is supportive and will be with him.  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  Wife substitutes, so she can be home with him as needed.  Pt was hoping he'd be done with his rehab when he left CIR, but CSW explained that our PT wants him to keep working on vestibular issues.  He expressed understanding.  CSW set him up with [facility] for PT vestibular program.  CSW also established pt with Dr. Ashby Dawes for PCP at [facility] where his wife goes for PCP.  OT recommended a shower seat, but pt wants to wait and see how he does at home.  Wife said she can purchase one and plans to put down a non-slip mat in the shower.  CSW remains available to assist with any other needs.  [name], Silvestre Mesi 05/18/2016, 1:08 PM

## 2016-05-18 NOTE — Progress Notes (Signed)
Occupational Therapy Session Note  Patient Details  Name: Tony LoJohnny Samudio MRN: [id number] Date of Birth: 11/19/1951  Today's Date: 05/18/2016 OT Individual Time: [id number] and 1300-1400 OT Individual Time Calculation (min): 45 min and 60 min   Short Term Goals: Week 1:  OT Short Term Goal 1 (Week 1): STGs = LTGs  Skilled Therapeutic Interventions/Progress Updates:    1) Treatment session with focus on family education, functional mobility, and self-care tasks. Recounted events from yesterday and engaged in discussion regarding "rebound" of symptoms.  Discussed pacing self with activities and incorporating rest breaks between activities.  Ambulated to room shower and completed bathing at sit > stand level from shower seat.  Discussed recommendation for use of shower seat and recommendation for supervision with tub/shower transfers due to increase in dizziness with vertical scanning (stepping over tub ledge).  Pt overall Mod I with bathing and dressing tasks.  Wife reports she doesn't work and that she will be home to assist as needed.  2) Treatment session with focus on family education and functional mobility during home making tasks.  Pt ambulated to ADL apt without assist and with no c/o dizziness.  Engaged in home making tasks of unloading dishwasher and placing items in high and low cabinets with reports of dizziness 2-3/10 with upper cabinets and solid 3/10 with lower cabinets.  Discussed transitioning from dishwasher to counter top to break up the transition. Completed tub/shower transfer with stepping over tub ledge and then sitting on shower chair.  Encouraged pt's wife to be present during shower transfers for safety as pt's dizziness tends to increase with vertical scanning.  Engaged in vacuuming with pt able to carryover techniques discussed in previous session.  Reports decrease in dizziness symptoms this session.  Engaged in [employer] with focus on visual scanning and saccades to locate and  select lights.  Increased challenge to adding grocery list with pt scanning between lights and t-scope listing grocery items.  Pt reports increased dizziness with increased scanning challenge.  Discussed transportation home and concerns with dizziness and nausea while riding in the car.  Pt and pt's wife report no further questions and ready to d/c home.  Therapy Documentation Precautions:  Precautions Precautions: Fall Precaution Comments: vertigo Restrictions Weight Bearing Restrictions: No General:   Vital Signs: Therapy Vitals Temp: 97.9 F (36.6 C) Temp Source: Oral Pulse Rate: 65 Resp: 18 BP: 117/77 mmHg Patient Position (if appropriate): Lying Oxygen Therapy SpO2: 98 % O2 Device: Not Delivered Pain:  Pt with no c/o pain  See Function Navigator for Current Functional Status.   Therapy/Group: Individual Therapy  [name], Destina Mantei 05/18/2016, 8:22 AM

## 2016-05-18 NOTE — Progress Notes (Signed)
[facility] Individual Statement of Services  Patient Name:  [name]  Date:  05/18/2016  Welcome to the [facility].  Our goal is to provide you with an individualized program based on your diagnosis and situation, designed to meet your specific needs.  With this comprehensive rehabilitation program, you will be expected to participate in at least 3 hours of rehabilitation therapies Monday-Friday, with modified therapy programming on the weekends.  Your rehabilitation program will include the following services:  Physical Therapy (PT), Occupational Therapy (OT), 24 hour per day rehabilitation nursing, Case Management (Social Worker), Rehabilitation Medicine, Nutrition Services and Pharmacy Services  Weekly team conferences will be held on Wednesdays to discuss your progress.  Your Social Worker will talk with you frequently to get your input and to update you on team discussions.  Team conferences with you and your family in attendance may also be held.  Expected length of stay: 3 to 5 days  Overall anticipated outcome: Modified Independent with supervision for community ambulation and showering  Depending on your progress and recovery, your program may change. Your Social Worker will coordinate services and will keep you informed of any changes. Your Social Worker's name and contact numbers are listed  below.  The following services may also be recommended but are not provided by the [facility]:   Driving Evaluations  Home Health Rehabiltiation Services  Outpatient Rehabilitation Services  Vocational Rehabilitation   Arrangements will be made to provide these services after discharge if needed.  Arrangements include referral to agencies that provide these services.  Your insurance has been verified to be:  BCBS and Medicare Your primary doctor is:  Dr. Georgianne FickAjith Ramachandran  Pertinent information will be shared with your  doctor and your insurance company.  Social Worker:  Staci AcostaJenny Rosario Duey, LCSW  [phone] or [phone]) [phone]  Information discussed with and copy given to patient by: Elvera LennoxPrevatt, [name], 05/18/2016, 11:12 AM

## 2016-05-18 NOTE — Progress Notes (Signed)
Patient discussed and reviwed discharged instructions with PA.He went home with his wife.

## 2016-05-21 ENCOUNTER — Ambulatory Visit: Payer: BC Managed Care – PPO | Attending: Physical Medicine & Rehabilitation | Admitting: Physical Therapy

## 2016-05-21 ENCOUNTER — Other Ambulatory Visit: Payer: Self-pay | Admitting: Physician Assistant

## 2016-05-21 ENCOUNTER — Telehealth: Payer: Self-pay

## 2016-05-21 DIAGNOSIS — I63439 Cerebral infarction due to embolism of unspecified posterior cerebral artery: Secondary | ICD-10-CM

## 2016-05-21 DIAGNOSIS — R42 Dizziness and giddiness: Secondary | ICD-10-CM

## 2016-05-21 DIAGNOSIS — I4891 Unspecified atrial fibrillation: Secondary | ICD-10-CM

## 2016-05-21 DIAGNOSIS — I639 Cerebral infarction, unspecified: Secondary | ICD-10-CM

## 2016-05-21 NOTE — Telephone Encounter (Signed)
Left message for patient to call back to complete Transitional care call.

## 2016-05-21 NOTE — Telephone Encounter (Signed)
Patient packet has been mailed. Appointment is scheduled for June 1st, 2017 at 1pm with Dr. Claudette LawsAndrew Kirsteins.

## 2016-05-21 NOTE — Therapy (Signed)
[facility] [address] [place], [place], [address] Phone: [phone]   Fax:  [phone]  Physical Therapy Evaluation  Patient Details  Name: [name] MRN: [id number] Date of Birth: [birth year] Referring Provider: [name], MD  Encounter Date: 05/21/2016      PT End of Session - 05/21/16 1554    Visit Number 1   Number of Visits 6   Date for PT Re-Evaluation 07/02/16   PT Start Time 1500   PT Stop Time 1542   PT Time Calculation (min) 42 min   Activity Tolerance Patient tolerated treatment well   Behavior During Therapy [facility] for tasks assessed/performed      Past Medical History  Diagnosis Date  . Healthy adult     No past surgical history on file.  There were no vitals filed for this visit.       Subjective Assessment - 05/21/16 1502    Subjective Pt is a 65 y/o male who presents to OPPT for dizziness following CVA on 05/08/16.  Pt hospitalized with CIR 3 days.  Pt presents today with continued dizziness.   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 No/denies            [facility] PT Assessment - 05/21/16 1506    Assessment   Medical Diagnosis CVA/vertigo   Referring Provider [name], MD   Onset Date/Surgical Date 05/08/16   Next MD Visit in process of scheduling   Prior Therapy CIR   Precautions   Precautions None   Balance Screen   Has the patient fallen in the past 6 months No   Has the patient had a decrease in activity level because of a fear of falling?  No   Is the patient reluctant to leave their home because of a fear of falling?  No   Home [employer]ist information centre manager residence   Living Arrangements Spouse/significant other   Available Help at Discharge Family   Type of Home House   Home Access Stairs to enter   Entrance Stairs-Number of Steps 2   Entrance  Stairs-Rails Left   Home Layout One level   Prior Function   Level of Independence Independent   Vocation Full time employment   Vocation Requirements school bus driver   [employer] [place], work on cars   Cognition   Overall Cognitive Status Within Functional Limits for tasks assessed   Observation/Other Assessments   Focus on Therapeutic Outcomes (FOTO)  40 (60% limited; predicted 28% limited)   Dizziness Handicap Inventory (DHI)  72 (severe)   Ambulation/Gait   Ambulation/Gait Yes   Ambulation/Gait Assistance 7: Independent   Ambulation Distance (Feet) 100 Feet   Assistive device None   Gait Pattern Within Functional Limits   Ambulation Surface Level;Indoor            Vestibular Assessment - 05/21/16 1512    Symptom Behavior   Type of Dizziness Spinning   Frequency of Dizziness occurs with positional changes - described as imbalance and lightheaded   Duration of Dizziness 1-2 min with positional changes; 2 episodes of true spinning lasting 1-3 hours   Aggravating Factors Supine to sit;Sit to stand;Turning head quickly   Relieving Factors Head stationary;Comments;Closing eyes  be still   Occulomotor Exam   Occulomotor Alignment Normal   Spontaneous Absent   Gaze-induced Absent   Smooth  Pursuits Intact   Saccades Intact;Comment  increase in symptoms   Vestibulo-Occular Reflex   VOR 1 Head Only (x 1 viewing) increase in symptoms vertical > horizontal - able to maintain gaze   Positional Testing   Dix-Hallpike Dix-Hallpike Right;Dix-Hallpike Left   Horizontal Canal Testing Horizontal Canal Right;Horizontal Canal Left   Dix-Hallpike Right   Dix-Hallpike Right Duration none   Dix-Hallpike Right Symptoms No nystagmus   Dix-Hallpike Left   Dix-Hallpike Left Duration none   Dix-Hallpike Left Symptoms No nystagmus   Horizontal Canal Right   Horizontal Canal Right Duration none   Horizontal Canal Right Symptoms Normal   Horizontal Canal Left   Horizontal Canal Left  Duration none   Horizontal Canal Left Symptoms Normal   Orthostatics   BP supine (x 5 minutes) 113/79 mmHg   HR supine (x 5 minutes) 63   BP standing (after 1 minute) 90/74 mmHg   HR standing (after 1 minute) 101   BP standing (after 3 minutes) 106/84 mmHg   HR standing (after 3 minutes) 86                       PT Education - 05/21/16 1553    Education provided Yes   Education Details orthostatic hypotension - definition, diagnosis, symptoms, treatment   Person(s) Educated Patient;Spouse   Methods Explanation;Handout   Comprehension Verbalized understanding             PT Long Term Goals - 05/21/16 1559    PT LONG TERM GOAL #1   Title independent with HEP (07/02/16)   Time 6   Period Weeks   Status New   PT LONG TERM GOAL #2   Title perform sensory organization test with goals to be written as indicated (7/317)   Time 6   Period Weeks   Status New   PT LONG TERM GOAL #3   Title improve DHI (FOTO) to < 50 for improved function (07/02/16)   Time 6   Period Weeks   Status New   PT LONG TERM GOAL #4   Title ambulate > 1000' without device on various indoor/outdoor surfaces independently for return to yardwork activities (07/02/16)   Time 6   Period Weeks   Status New               Plan - 05/21/16 1556    Clinical Impression Statement Pt is a 65 y/o male who presents to OPPT s/p CVA with residual c/o dizziness and vertigo.  Pt demonstrates decreased visuo-vestibular input and has increased symptoms with eye and head movements.  Pt with current gaze stabilization HEP from CIR and recommended to continue.  Pt also positive for orthostatic hypotension and recommended follow up with MD for further recommendations.  Will benefit from PT to address deficits listed.   Rehab Potential Good   PT Frequency 1x / week   PT Duration 6 weeks   PT Treatment/Interventions ADLs/Self Care Home Management;Vestibular;Visual/perceptual  remediation/compensation;Patient/family education;Neuromuscular re-education;Balance training;Therapeutic exercise;Therapeutic activities;Functional mobility training;Gait training;Stair training   PT Next Visit Plan SOT; add saccades to HEP and corner balance exercises as indicated   Consulted and Agree with Plan of Care Patient;Family member/caregiver   Family Member Consulted wife      Patient will benefit from skilled therapeutic intervention in order to improve the following deficits and impairments:  Decreased mobility, Dizziness  Visit Diagnosis: Dizziness and giddiness - Plan: PT plan of care cert/re-cert      G-Codes -  05/21/16 1602    Functional Assessment Tool Used DHI   Functional Limitation Mobility: Walking and moving around   Mobility: Walking and Moving Around Current Status [phone]) At least 60 percent but less than 80 percent impaired, limited or restricted   Mobility: Walking and Moving Around Goal Status [phone]) At least 40 percent but less than 60 percent impaired, limited or restricted       Problem List Patient Active Problem List   Diagnosis Date Noted  . HLD (hyperlipidemia)   . Nausea without vomiting 05/16/2016  . Cerebellar stroke (HCC) 05/15/2016  . Acute right PCA stroke (HCC) 05/10/2016  . Acute CVA (cerebrovascular accident) (HCC) 05/10/2016  . Emesis   . Vertigo 05/08/2016  . Intractable nausea and vomiting 05/08/2016   Clarita Crane, PT, DPT 05/21/2016 4:03 PM  [facility] [address] [place], [place], [address] Phone: [phone]   Fax:  [phone]  Name: Butler Vegh MRN: [id number] Date of Birth: Mar 08, 1951

## 2016-05-22 ENCOUNTER — Ambulatory Visit (INDEPENDENT_AMBULATORY_CARE_PROVIDER_SITE_OTHER): Payer: BC Managed Care – PPO

## 2016-05-22 DIAGNOSIS — I639 Cerebral infarction, unspecified: Secondary | ICD-10-CM

## 2016-05-22 DIAGNOSIS — I63439 Cerebral infarction due to embolism of unspecified posterior cerebral artery: Secondary | ICD-10-CM | POA: Diagnosis not present

## 2016-05-22 DIAGNOSIS — R42 Dizziness and giddiness: Secondary | ICD-10-CM

## 2016-05-22 DIAGNOSIS — I4891 Unspecified atrial fibrillation: Secondary | ICD-10-CM

## 2016-05-22 MED ORDER — ONDANSETRON HCL 4 MG PO TABS: 4.0000 mg | ORAL_TABLET | Freq: Four times a day (QID) | ORAL | Status: DC | PRN

## 2016-05-22 NOTE — Telephone Encounter (Signed)
1. Are you/is patient experiencing any problems since coming home? Are there any questions regarding any aspect of care? Everything going well except the orthostatic hypotension 2. Are there any questions regarding medications administration/dosing? Are meds being taken as prescribed? Yes. Would like Zofran 3. Have there been any falls?No 4. Has Home Health been to the house and/or have they contacted you? If not, have you tried to contact them? Can we help you contact them? Everything is scheduled 5. Are bowels and bladder emptying properly? Are there any unexpected incontinence issues? If applicable, is patient following bowel/bladder programs? No problems 6. Any fevers, problems with breathing, unexpected pain? No pain 7. Are there any skin problems or new areas of breakdown? No breakdown or other skin problems 8. Has the patient/family member arranged specialty MD follow up (ie cardiology/neurology/renal/surgical/etc)?  Can we help arrange? All appointments from discharge summary have been made by the wife 9. Does the patient need any other services or support that we can help arrange? Wife is primary support 10. Are caregivers following through as expected in assisting the patient?    Meclizine for dizziness. Wife wants to know if we can RX Zofran for nausea? [facility] on [address]. Please call wife when this is called in [phone] cell

## 2016-05-22 NOTE — Telephone Encounter (Signed)
Call in Zofran 4mg  #30 one by mouth every 6 hours when necessary nausea, no refill

## 2016-05-22 NOTE — Addendum Note (Signed)
Addended by: Lucretia RoersWOOD, Averey Koning L on: 05/22/2016 11:10 AM   Modules accepted: Orders

## 2016-05-22 NOTE — Telephone Encounter (Signed)
Wife states he was given Zofran in hospital and it helped with nausea. She would like that called in if possible.

## 2016-05-22 NOTE — Telephone Encounter (Signed)
Zofran sent to pharmacy. Pt's wife aware.

## 2016-05-31 ENCOUNTER — Ambulatory Visit (HOSPITAL_BASED_OUTPATIENT_CLINIC_OR_DEPARTMENT_OTHER): Payer: BC Managed Care – PPO | Admitting: Physical Medicine & Rehabilitation

## 2016-05-31 ENCOUNTER — Encounter: Payer: BC Managed Care – PPO | Attending: Physical Medicine & Rehabilitation

## 2016-05-31 ENCOUNTER — Encounter: Payer: Self-pay | Admitting: Physical Medicine & Rehabilitation

## 2016-05-31 VITALS — BP 115/72 | HR 77 | Resp 14

## 2016-05-31 DIAGNOSIS — I69393 Ataxia following cerebral infarction: Secondary | ICD-10-CM | POA: Diagnosis not present

## 2016-05-31 DIAGNOSIS — I639 Cerebral infarction, unspecified: Secondary | ICD-10-CM | POA: Diagnosis not present

## 2016-05-31 DIAGNOSIS — E785 Hyperlipidemia, unspecified: Secondary | ICD-10-CM | POA: Insufficient documentation

## 2016-05-31 NOTE — Patient Instructions (Addendum)
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

## 2016-05-31 NOTE — Progress Notes (Signed)
Subjective:    Patient ID: [name], male    DOB: 07/05/1951, 65 y.o.   MRN: [id number] 65 y.o. male in relatively good health who was admitted on 05/09 with sudden onset of dizziness, N/V and weakness. CT head negative and MRI brain aborted due to nausea and abdominal discomfort. Neurology evaluated patient and he was found to have no focal deficits except nystagmus on right lateral gaze. He has had significant vertigo was treated for dizziness. MRI brain done today showing large area of acute infarct in right cerebellar infarct involving most of PICA on right and small areas of acute infarct in left inferior cerebellum with mild mass effect on 4th ventricle. 2D echo with EF 55-60% with grade 1 diastolic dysfunction and mild MVR.  Transitional care visit after inpatient rehabilitation stay from 5/16 -5/19/ 2017 Phone call documented HPI  Seen by primary care physician yesterday, Dr. Nicholos [name]. This was an initial visit patient had some blood work done Has not required any Zofran for nausea Had to take 2 Antivert's since going home due to dizziness Orthostatic blood pressure measured on 05/21/2016 while in physical therapy Laying down 113/79 with heart rate of 63 Standing 90/74 heart rate 101 Standing 3 minutes 106/84 heart rate 86 Pain Inventory Average Pain 0 Pain Right Now 0 My pain is NA  In the last 24 hours, has pain interfered with the following? General activity 0 Relation with others 0 Enjoyment of life 0 What TIME of day is your pain at its worst? NA Sleep (in general) Fair  Pain is worse with: NA Pain improves with: NA Relief from Meds: NA  Mobility walk without assistance how many minutes can you walk? 2-3 ability to climb steps?  yes do you drive?  no Do you have any goals in this area?  yes  Function employed # of hrs/week 38-40 Do you have any goals in this area?  yes  Neuro/Psych weakness dizziness anxiety  Prior Studies Any changes since  last visit?  no  Physicians involved in your care Primary care [name] Neurologist [name]   Family History  Problem Relation Age of Onset  . Stroke Mother [id number]  . Diabetes Father   . Cancer Brother     lung   Social History   Social History  . Marital Status: Single    Spouse Name: N/A  . Number of Children: N/A  . Years of Education: N/A   Social History Main Topics  . Smoking status: Never Smoker   . Smokeless tobacco: Never Used  . Alcohol Use: No  . Drug Use: No  . Sexual Activity: Not Asked   Other Topics Concern  . None   Social History Narrative   History reviewed. No pertinent past surgical history. Past Medical History  Diagnosis Date  . Healthy adult    BP 115/72 mmHg  Pulse 77  Resp 14  SpO2 97%  Opioid Risk Score:   Fall Risk Score:  `1  Depression screen PHQ 2/9  Depression screen PHQ 2/9 05/31/2016  Decreased Interest 0  Down, Depressed, Hopeless 0  PHQ - 2 Score 0  Altered sleeping 1  Tired, decreased energy 1  Change in appetite 0  Feeling bad or failure about yourself  0  Trouble concentrating 1  Moving slowly or fidgety/restless 0  Suicidal thoughts 0  PHQ-9 Score 3  Difficult doing work/chores Not difficult at all     Review of Systems  Neurological: Positive for dizziness and weakness.  Psychiatric/Behavioral: The patient is nervous/anxious.   All other systems reviewed and are negative.      Objective:   Physical Exam  Constitutional: He appears well-developed and well-nourished.  HENT:  Head: Normocephalic and atraumatic.  Nursing note and vitals reviewed.  Mild dysmetria left finger-nose-finger  General: No acute distress Mood and affect are appropriate Heart: Regular rate and rhythm no rubs murmurs or extra sounds Lungs: Clear to auscultation, breathing unlabored, no rales or wheezes Abdomen: Positive bowel sounds, soft nontender to palpation, nondistended Extremities: No clubbing, cyanosis, or edema Skin: No  evidence of breakdown, no evidence of rash Neurologic: Cranial nerves II through XII intact, motor strength is 5/5 in bilateral deltoid, bicep, tricep, grip, hip flexor, knee extensors, ankle dorsiflexor and plantar flexor Sensory exam normal sensation to light touch and proprioception in bilateral upper and lower extremities  Musculoskeletal: Full range of motion in all 4 extremities. No joint swelling        Assessment & Plan:  Medical Problem List and Plan: 1.  Truncal ataxia/balance and functional deficits secondary to bilateral cerebellar infarcts, Still has some mild dysmetria on left finger-nose-finger  Still has occasional dizziness which I think is central mainly with head position changes. I do not think he should be driving yet.  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 He has an appointment with neurology at the end of July and he may stay discuss that with Dr. Roda Berg at that time  Continue outpatient physical therapy  Physical medicine and rehabilitation follow-up on an as-needed basis  2. Neuropsych:No cognitive dysfunction post CVA  3. Grade 1 diastolic dysfunction/Blood pressure, Has orthostatic hypotension. We'll follow-up with primary care as well as cardiology on this. This is contributing to his dizziness.   4. Dyslipidemia: On lipitor. Follow-up PCP

## 2016-06-01 ENCOUNTER — Encounter: Payer: Self-pay | Admitting: Physical Medicine & Rehabilitation

## 2016-06-01 NOTE — Telephone Encounter (Signed)
Please advise 

## 2016-06-05 ENCOUNTER — Ambulatory Visit
Payer: BC Managed Care – PPO | Attending: Physical Medicine & Rehabilitation | Admitting: Rehabilitative and Restorative Service Providers"

## 2016-06-05 DIAGNOSIS — R42 Dizziness and giddiness: Secondary | ICD-10-CM

## 2016-06-05 NOTE — Patient Instructions (Signed)
Gaze Stabilization: Tip Card 1.Target must remain in focus, not blurry, and appear stationary while head is in motion. 2.Perform exercises with small head movements (45 to either side of midline). 3.Increase speed of head motion so long as target is in focus. 4.If you wear eyeglasses, be sure you can see target through lens (therapist will give specific instructions for bifocal / progressive lenses). 5.These exercises may provoke dizziness or nausea. Work through these symptoms. If too dizzy, slow head movement slightly. Rest between each exercise. 6.Exercises demand concentration; avoid distractions. 7.For safety, perform standing exercises close to a counter, wall, corner, or next to someone.  Copyright  VHI. All rights reserved.  Gaze Stabilization: Standing Feet Apart   Feet shoulder width apart, keeping eyes on target on wall 3 feet away, tilt head down slightly and move head side to side for 30 seconds. Repeat while moving head up and down for 30 seconds. Do 2 sessions per day.   Copyright  VHI. All rights reserved.   Feet Together (Compliant Surface) Varied Arm Positions - Eyes Closed    Stand on compliant surface: _pillow__ with feet together and arms at your side. Close eyes and visualize upright position. Hold__30__ seconds. Repeat __3__ times per session. Do __2__ sessions per day.  Copyright  VHI. All rights reserved.   Feet Together (Compliant Surface) Head Motion - Eyes Open    With eyes open, standing on compliant surface: __pillow______, feet together, move head slowly: up and down x 5 reps.  Side to side x 5 reps. Do _2___ sessions per day.  Copyright  VHI. All rights reserved.   Feet Partial Heel-Toe, Varied Arm Positions - Eyes Open    With eyes open, right foot partially in front of the other, arms out, look straight ahead at a stationary object. Hold __30__ seconds. Repeat __3__ times with each foot forward per session. Do _2___ sessions per  day.  Copyright  VHI. All rights reserved.   Single Leg - Eyes Open    Holding support, lift right leg while maintaining balance over other leg. Progress to removing hands from support surface for longer periods of time. Hold__10__ seconds. Repeat __3__ times per leg per session. Do _2___ sessions per day.  Copyright  VHI. All rights reserved.

## 2016-06-05 NOTE — Therapy (Signed)
[facility] [address] [place], [place], [address] Phone: [phone]   Fax:  [phone]  Physical Therapy Treatment  Patient Details  Name: [name] MRN: [id number] Date of Birth: 07/19/1951 Referring Provider: Andrew E Kirsteins, MD  Encounter Date: 06/05/2016      PT End of Session - 06/05/16 1501    Visit Number 2   Number of Visits 6   Date for PT Re-Evaluation 07/02/16   PT Start Time 1452   PT Stop Time 1332   PT Time Calculation (min) 1360 min   Activity Tolerance Patient tolerated treatment well   Behavior During Therapy WFL for tasks assessed/performed      Past Medical History  Diagnosis Date  . Healthy adult     No past surgical history on file.  There were no vitals filed for this visit.      Subjective Assessment - 06/05/16 1457    Subjective "I'm getting a lot better."  Patient is returning to regular walking and household activities.  He notes he gets tired quickly with walking.     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 No/denies      NEUROMUSCULAR RE-EDUCATION: Sensory Organization Testing=73% compared to age/height normative value of 70%.   Patient demonstrated WNLs use of somatosensory feedback for balance, WNLs use of visual feedback for balance, and diminished (scores 24% compared to norm of 55%) use of vestibular feedback for balance.  Instructed patient in HEP in corner for high level balance including feet together eyes closed,  Feet together head motion Partial heel toe with eyes open Single leg stance  Also performed standing with wider base with eyes open and attempted tandem with difficulty.   Gaze x 1 viewing home exercises reviewed with education on rationale for performance   *Patient needed multiple rest breaks due to fatigue during session.       PT Education - 06/05/16  1536    Education provided Yes   Education Details gaze stabilization, corner balance:  foam with eyes closed, foam with head motion, partial tandem, single leg stance   Person(s) Educated Patient   Methods Explanation;Demonstration;Handout   Comprehension Verbalized understanding;Returned demonstration             PT Long Term Goals - 06/05/16 1536    PT LONG TERM GOAL #1   Title independent with HEP (07/02/16)   Time 6   Period Weeks   Status On-going   PT LONG TERM GOAL #2   Title perform sensory organization test with goals to be written as indicated (7/317)   Baseline Met on 06/05/2016- see new LTG #5   Time 6   Period Weeks   Status Achieved   PT LONG TERM GOAL #3   Title improve DHI (FOTO) to < 50 for improved function (07/02/16)   Time 6   Period Weeks   Status On-going   PT LONG TERM GOAL #4   Title ambulate > 1000' without device on various indoor/outdoor surfaces independently for return to yardwork activities (07/02/16)   Time 6   Period Weeks   Status On-going   PT LONG TERM GOAL #5   Title The patient will improve sensory organization test vestibular use to WNLs for age/height.   Baseline Target date 07/02/2016   Time 6   Period Weeks   Status New                 Plan - 06/05/16 1538    Clinical Impression Statement The patient is returning to functional tasks at home and walking, which seem to be improving dizziness and overall mobility.  He is still limited by fatigue and use of vestibular inputs for balance.  Patient has difficulty with multi-sensory balance environments and single limb stance activities.     PT Treatment/Interventions ADLs/Self Care Home Management;Vestibular;Visual/perceptual remediation/compensation;Patient/family education;Neuromuscular re-education;Balance training;Therapeutic exercise;Therapeutic activities;Functional mobility training;Gait training;Stair training   PT Next Visit Plan Check HEP, functional habituation (diagonal  reaching), exercises for improved vestibular inputs/ narrow base of support, dynamic gait with head motion, check saccades   Consulted and Agree with Plan of Care Patient      Patient will benefit from skilled therapeutic intervention in order to improve the following deficits and impairments:  Decreased mobility, Dizziness  Visit Diagnosis: Dizziness and giddiness     Problem List Patient Active Problem List   Diagnosis Date Noted  . 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 HLD (hyperlipidemia)   . Nausea without vomiting 05/16/2016  . Cerebellar stroke (HCC) 05/15/2016  . Acute right PCA stroke (HCC) 05/10/2016  . Acute CVA (cerebrovascular accident) (HCC) 05/10/2016  . Emesis   . Vertigo 05/08/2016  . Intractable nausea and vomiting 05/08/2016    WEAVER,CHRISTINA, PT 06/05/2016, 3:40 PM  [facility] [address] [place], [place], [address] Phone: [phone]   Fax:  [phone]  Name: [name] MRN: [id number] Date of Birth: 10/26/1951     

## 2016-06-14 ENCOUNTER — Ambulatory Visit: Payer: BC Managed Care – PPO | Admitting: Physical Therapy

## 2016-06-14 DIAGNOSIS — R42 Dizziness and giddiness: Secondary | ICD-10-CM | POA: Diagnosis not present

## 2016-06-14 NOTE — Patient Instructions (Addendum)
Gaze Stabilization: Tip Card 1.Target must remain in focus, not blurry, and appear stationary while head is in motion. 2.Perform exercises with small head movements (45 to either side of midline). 3.Increase speed of head motion so long as target is in focus. 4.If you wear eyeglasses, be sure you can see target through lens (therapist will give specific instructions for bifocal / progressive lenses). 5.These exercises may provoke dizziness or nausea. Work through these symptoms. If too dizzy, slow head movement slightly. Rest between each exercise. 6.Exercises demand concentration; avoid distractions. 7.For safety, perform standing exercises close to a counter, wall, corner, or next to someone.  Copyright  VHI. All rights reserved.  Gaze Stabilization: Standing Feet Apart   Feet shoulder width apart, keeping eyes on target on wall 3 feet away, tilt head down slightly and move head side to side for __45__ seconds. Repeat while moving head up and down for __45__ seconds. Do 2 sessions per day.   Copyright  VHI. All rights reserved.   Feet Together (Compliant Surface) Varied Arm Positions - Eyes Closed    Stand on compliant surface: _2 pillows__ with feet together and arms at your side. Close eyes and visualize upright position. Hold__30__ seconds. Repeat __3__ times per session. Do __2__ sessions per day.  Copyright  VHI. All rights reserved.   Feet Together (Compliant Surface) Head Motion - Eyes Open    With eyes open, standing on compliant surface: __1 pillow______, feet together, move head slowly: up and down x 10 reps. Side to side x 10 reps. Find something to look at in each direction you're turning your head. Do _2___ sessions per day.  Copyright  VHI. All rights reserved.   Feet Partial Heel-Toe, Varied Arm Positions - Eyes Open   Repeat __3__ times with each foot forward per session. Do _2___ sessions per day.  Feet Heel-Toe "Tandem", Varied Arm Positions - Eyes  Open    Stand next to your countertop. With eyes open, right foot directly in front of the other. Place hand next to countertop (hold on if you lose your balance). Look straight ahead at a stationary object. Hold __30__ seconds. Repeat __3__ times per session. Do __2__ sessions per day.  Copyright  VHI. All rights reserved.    Single Leg - Eyes Open    Holding support, lift right leg while maintaining balance over other leg. Progress to removing hands from support surface for longer periods of time. Hold__10__ seconds. Repeat __3__ times per leg per session. Do _2___ sessions per day.

## 2016-06-14 NOTE — Therapy (Signed)
[facility] [address] [place], [name], [address] Phone: [phone]   Fax:  [phone]  Physical Therapy Treatment  Patient Details  Name: [name] MRN: [id number] Date of Birth: 12-Nov-1951 Referring Provider: Charlett Blake, MD  Encounter Date: 06/14/2016      PT End of Session - 06/14/16 1706    Visit Number 3   Number of Visits 6   Date for PT Re-Evaluation 07/02/16   PT Start Time 1410   PT Stop Time 1448   PT Time Calculation (min) 38 min   Activity Tolerance Patient tolerated treatment well   Behavior During Therapy [facility] for tasks assessed/performed      Past Medical History  Diagnosis Date  . Healthy adult     No past surgical history on file.  There were no vitals filed for this visit.      Subjective Assessment - 06/14/16 1412    Subjective "I'm doing those exercises. 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 No/denies                Vestibular Assessment - 06/14/16 0001    Occulomotor Exam   Saccades Dysmetria;Comment  see below   Comment Hypermetric saccades (asymptomatic) from L to midline; hypometric saccades in all other directions                      Balance Exercises - 06/14/16 1705    Balance Exercises: Standing   Turning Both;3 reps  mild LOB x1 with effective self-recovery           PT Education - 06/14/16 1459    Education provided Yes   Education Details Reviewed/progressed current HEP; see Pt Instructions for details. Per patient inquiries as to rationale for specific exercises, explained importance of increasing safety/stability in situations in which pt must rely more on vestibular system.    Person(s) Educated Patient   Methods  Explanation;Verbal cues;Handout   Comprehension Verbalized understanding;Returned demonstration             PT Long Term Goals - 06/05/16 1536    PT LONG TERM GOAL #1   Title independent with HEP (07/02/16)   Time 6   Period Weeks   Status On-going   PT LONG TERM GOAL #2   Title perform sensory organization test with goals to be written as indicated (7/317)   Baseline Met on 06/05/2016- see new LTG #5   Time 6   Period Weeks   Status Achieved   PT LONG TERM GOAL #3   Title improve DHI (FOTO) to < 50 for improved function (07/02/16)   Time 6   Period Weeks   Status On-going   PT LONG TERM GOAL #4   Title ambulate > 1000' without device on various indoor/outdoor surfaces independently for return to yardwork activities (07/02/16)   Time 6   Period Weeks   Status On-going   PT LONG TERM GOAL #5   Title The patient will improve sensory organization test vestibular use to WNLs for age/height.   Baseline Target date 07/02/2016   Time 6   Period Weeks   Status New               Plan -  06/14/16 1707    Clinical Impression Statement Session focused on assessing and progressing current HEP to mitigate dizziness/disequilibrium and increase stability/balance. Pt tolerated interventions well. Appears to be better tolerating functional head movement during mobility; however, continues to demo decreased stability with single limb stance and 180-degree turns.    PT Treatment/Interventions ADLs/Self Care Home Management;Vestibular;Visual/perceptual remediation/compensation;Patient/family education;Neuromuscular re-education;Balance training;Therapeutic exercise;Therapeutic activities;Functional mobility training;Gait training;Stair training   PT Next Visit Plan Exercises for improved vestibular inputs/ narrow base of support, dynamic gait with head motion.   Consulted and Agree with Plan of Care Patient      Patient will benefit from skilled therapeutic intervention in order to improve  the following deficits and impairments:  Decreased mobility, Dizziness  Visit Diagnosis: Dizziness and giddiness     Problem List Patient Active Problem List   Diagnosis Date Noted  . 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 HLD (hyperlipidemia)   . Nausea without vomiting 05/16/2016  . Cerebellar stroke ([place]) 05/15/2016  . Acute right PCA stroke ([place]) 05/10/2016  . Acute CVA (cerebrovascular accident) ([place]) 05/10/2016  . Emesis   . Vertigo 05/08/2016  . Intractable nausea and vomiting 05/08/2016    Billie Ruddy, PT, DPT [facility] [address] [place], [name], [address] Phone: [phone]   Fax:  [phone] 06/14/2016, 5:10 PM  Name: [name] MRN: [id number] Date of Birth: 09/14/1951

## 2016-06-18 ENCOUNTER — Ambulatory Visit: Payer: BC Managed Care – PPO | Admitting: Physical Therapy

## 2016-06-18 DIAGNOSIS — R42 Dizziness and giddiness: Secondary | ICD-10-CM | POA: Diagnosis not present

## 2016-06-18 NOTE — Therapy (Signed)
[name] [address] [place], [name], [address] Phone: [phone]   Fax:  [phone]  Physical Therapy Treatment  Patient Details  Name: [name] MRN: [id number] Date of Birth: 1951/12/04 Referring Provider: Charlett Blake, MD  Encounter Date: 06/18/2016      PT End of Session - 06/18/16 1606    Visit Number 4   Number of Visits 6   Date for PT Re-Evaluation 07/02/16   PT Start Time 1450   PT Stop Time 1535   PT Time Calculation (min) 45 min   Equipment Utilized During Treatment Gait belt      Past Medical History  Diagnosis Date  . Healthy adult     No past surgical history on file.  There were no vitals filed for this visit.      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 No/denies                              Balance Exercises - 06/18/16 1602    Balance Exercises: Standing   Standing Eyes Opened Wide (BOA);Foam/compliant surface;30 secs;2 reps  1 pillow then 2 pillows   Standing Eyes Closed Narrow base of support (BOS);Wide (BOA);Head turns;5 reps   SLS Foam/compliant surface;Eyes open;Intermittent upper extremity support;2 reps;Other (comment)  5 secs   Stepping Strategy Foam/compliant surface;Anterior;Posterior;5 reps   Rockerboard Anterior/posterior;Head turns;EO;EC;10 reps  horizontal and vertical head turns   Gait with Head Turns Forward;Other (comment)   Turning Right;Left;5 reps  with 180 degree turn       Standing on mat (blue) - partial tandem stance with head turns with targets horizontal and vertical - 10 reps each with CGA  Figure  8 with ball between legs - on blue mat - stand upright and turn 180 degrees - fig. 8 with ball - 180 degree turn - 5 reps; pt reported no dizziness with this activity      PT Education - 06/18/16 1605    Education provided Yes   Education Details progressed to head turns with EC on compliant surface for HEP   Person(s) Educated Patient   Methods Explanation;Demonstration   Comprehension Verbalized understanding;Returned demonstration             PT Long Term Goals - 06/18/16 1537    PT LONG TERM GOAL #1   Title independent with HEP (07/02/16)   Time 6   Period Weeks   Status On-going   PT LONG TERM GOAL #2   Title perform sensory organization test with goals to be written as indicated (7/317)   Baseline Met on 06/05/2016- see new LTG #5   Time 6   Period Weeks   Status Achieved   PT LONG TERM GOAL #3   Title improve DHI (FOTO) to < 50 for improved function (07/02/16)   Time 6   Period Weeks   Status On-going   PT LONG TERM GOAL #4   Title ambulate > 1000' without device on various indoor/outdoor surfaces independently for return to yardwork activities (07/02/16)   Time  6   Period Weeks   Status On-going   PT LONG TERM GOAL #5   Title The patient will improve sensory organization test vestibular use to WNLs for age/height.   Baseline Target date 07/02/2016   Time 6   Period Weeks   Status New               Plan - 06/18/16 1615    Clinical Impression Statement Pt reported no true dizziness with any activities but did report that he frequently gets light-headed with supine to sit and with sit to stand;  pt had > LOB toward L side than toward R side but performed vestibular/balance activites with minimal LOB overall; pt appears to be ready for discharge at next session   Rehab Potential Good   PT Frequency 1x / week   PT Duration 6 weeks   PT Treatment/Interventions ADLs/Self Care Home Management;Vestibular;Visual/perceptual remediation/compensation;Patient/family  education;Neuromuscular re-education;Balance training;Therapeutic exercise;Therapeutic activities;Functional mobility training;Gait training;Stair training   PT Next Visit Plan check LTG's and D/C   PT Home Exercise Plan added head turns with EC on compliant surface      Patient will benefit from skilled therapeutic intervention in order to improve the following deficits and impairments:  Decreased mobility, Dizziness  Visit Diagnosis: Dizziness and giddiness     Problem List Patient Active Problem List   Diagnosis Date Noted  . 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 HLD (hyperlipidemia)   . Nausea without vomiting 05/16/2016  . Cerebellar stroke ([place]) 05/15/2016  . Acute right PCA stroke ([place]) 05/10/2016  . Acute CVA (cerebrovascular accident) ([place]) 05/10/2016  . Emesis   . Vertigo 05/08/2016  . Intractable nausea and vomiting 05/08/2016    Alda Lea, PT 06/18/2016, 4:23 PM  [facility] [address] [place], [name], [address] Phone: [phone]   Fax:  [phone]  Name: [name] MRN: [id number] Date of Birth: 01/12/1951

## 2016-06-25 ENCOUNTER — Telehealth: Payer: Self-pay | Admitting: *Deleted

## 2016-06-25 NOTE — Telephone Encounter (Signed)
Patient has his stroke f/u scheduled for July 26 but wanted something sooner if possible.  I scheduled him for July 10 but left the July 26 in case the 10th is too soon.  I explained to patient and wife that he probably needs to be seen by Dr. Roda ShuttersXu in f/u before he can be released but the nurse will call to discuss.

## 2016-06-25 NOTE — Telephone Encounter (Signed)
Rn call patient back a sooner appt. Pts appt was move to 07/09/2016 with Dr. Roda ShuttersXu. 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 Rn stated Dr. Roda ShuttersXu will have to make that final decision. Rn stated a letter can be written if he is approve for work, or a form can be filled out. Rn stated he should call his employer to find out what information they need from [employer]. Pt will call and found out before 07/09/2016. Rn stated if a form is required there is a charge for [employer] md to fill out. Pt verbalized understanding.

## 2016-06-27 ENCOUNTER — Encounter: Payer: BC Managed Care – PPO | Admitting: Physical Therapy

## 2016-06-28 ENCOUNTER — Ambulatory Visit (INDEPENDENT_AMBULATORY_CARE_PROVIDER_SITE_OTHER): Payer: BC Managed Care – PPO | Admitting: Interventional Cardiology

## 2016-06-28 ENCOUNTER — Encounter: Payer: Self-pay | Admitting: Interventional Cardiology

## 2016-06-28 VITALS — BP 110/80 | HR 80 | Ht 73.0 in | Wt 196.0 lb

## 2016-06-28 DIAGNOSIS — I63341 Cerebral infarction due to thrombosis of right cerebellar artery: Secondary | ICD-10-CM | POA: Diagnosis not present

## 2016-06-28 DIAGNOSIS — E785 Hyperlipidemia, unspecified: Secondary | ICD-10-CM | POA: Diagnosis not present

## 2016-06-28 NOTE — Progress Notes (Signed)
Cardiology Office Note   Date:  06/28/2016   ID:  [name], DOB 12/24/1951, MRN [id number]  PCP:  Georgianne FickAMACHANDRAN,AJITH, MD    No chief complaint on file. f/u cerebellar CVA   Wt Readings from Last 3 Encounters:  06/28/16 196 lb (88.905 kg)  05/15/16 197 lb (89.359 kg)  05/08/16 200 lb (90.719 kg)       History of Present Illness: [name] is a 65 y.o. male  who was admitted on 05/09 with sudden onset of dizziness, N/V and weakness. CT head negative and MRI brain aborted due to nausea and abdominal discomfort. Neurology evaluated patient and he was found to have no focal deficits except nystagmus on right lateral gaze. He has had significant vertigo was treated for dizziness. MRI brain done today showing large area of acute infarct in right cerebellar infarct involving most of PICA on right and small areas of acute infarct in left inferior cerebellum with mild mass effect on 4th ventricle. 2D echo with EF 55-60% with grade 1 diastolic dysfunction and mild MVR. Bilateral PICA infarcts felt to be embolic of unknown etiology and patient on ASA for secondary stroke prevention.  He had acute onset of vertigo with N/V on 05/18. CT head and MRI brain were negative for extension or hemorrhagic conversion.  Monitor was ordered to evaluate for AFib as cause of stroke.  No atrial arrhythmias were detected.  He is walking more.  He denies any chest discomfort or shortness of breath. He continues to do outpatient rehabilitation. He feels quite well. He denies any palpitations.    Past Medical History  Diagnosis Date  . Healthy adult     History reviewed. No pertinent past surgical history.   Current Outpatient Prescriptions  Medication Sig Dispense Refill  . aspirin EC 325 MG EC tablet Take 1 tablet (325 mg total) by mouth daily. 30 tablet 0  . atorvastatin (LIPITOR) 40 MG tablet Take 1 tablet (40 mg total) by mouth daily at 6 PM. 30 tablet 0  . diphenhydrAMINE (BENADRYL)  25 mg capsule Take 1 capsule (25 mg total) by mouth at bedtime as needed for sleep. 30 capsule 0  . meclizine (ANTIVERT) 12.5 MG tablet Take 1 tablet (12.5 mg total) by mouth 3 (three) times daily as needed for dizziness. 90 tablet 0  . ondansetron (ZOFRAN) 4 MG tablet Take 1 tablet (4 mg total) by mouth every 6 (six) hours as needed for nausea. 30 tablet 0   No current facility-administered medications for this visit.    Allergies:   Review of patient's allergies indicates no known allergies.    Social History:  The patient  reports that he has never smoked. He has never used smokeless tobacco. He reports that he does not drink alcohol or use illicit drugs.   Family History:  The patient's family history includes Cancer in his brother; Diabetes in his father; Stroke (age of onset: 5883) in his mother. There is no history of Heart attack or Hypertension.    ROS:  Please see the history of present illness.   Otherwise, review of systems are positive for improving strength.   All other systems are reviewed and negative.    PHYSICAL EXAM: VS:  BP 110/80 mmHg  Pulse 80  Ht 6\' 1"  (1.854 m)  Wt 196 lb (88.905 kg)  BMI 25.86 kg/m2 , BMI Body mass index is 25.86 kg/(m^2). GEN: Well nourished, well developed, in no acute distress HEENT: normal Neck: no JVD, carotid bruits,  or masses Cardiac: RRR; no murmurs, rubs, or gallops,no edema  Respiratory:  clear to auscultation bilaterally, normal work of breathing GI: soft, nontender, nondistended, + BS MS: no deformity or atrophy Skin: warm and dry, no rash Neuro:  Strength and sensation are intact Psych: euthymic mood, full affect   EKG:   The ekg ordered in 5/17 demonstrates NSR, no ST segment changes   Recent Labs: 05/16/2016: ALT 28; BUN 15; Creatinine, Ser 0.98; Hemoglobin 15.6; Platelets 205; Potassium 3.9; Sodium 142   Lipid Panel    Component Value Date/Time   CHOL 165 05/09/2016 0042   TRIG 34 05/09/2016 0042   HDL 41  05/09/2016 0042   CHOLHDL 4.0 05/09/2016 0042   VLDL 7 05/09/2016 0042   LDLCALC 117* 05/09/2016 0042     Other studies Reviewed: Additional studies/ records that were reviewed today with results demonstrating: .   ASSESSMENT AND PLAN:  1. CVA: No cardiac etiology found.  I reviewed his ECG and echocardiogram results. He did not have a transesophageal echo. I did not see any carotid Dopplers. I will defer to his neurologist as to whether these tests are needed. If they do want a transesophageal echocardiogram, we can arrange for this. I explained the procedure to the patient as well. 2. Hyperlipidemia: LDL target less than 100 given CVA. Agree with starting atorvastatin. 3. He is hoping to return to work. He is a school bus driver. He feels that he is ready. He will need neurology approval. 4. No other cardiac symptoms. No indication for stress testing at this time. He will follow-up as needed.   Current medicines are reviewed at length with the patient today.  The patient concerns regarding his medicines were addressed.  The following changes have been made:  No change  Labs/ tests ordered today include:  No orders of the defined types were placed in this encounter.    Recommend 150 minutes/week of aerobic exercise Low fat, low carb, high fiber diet recommended  Disposition:   FU prn   Signed, Lance MussJayadeep Varanasi, MD  06/28/2016 9:39 AM    [facility] [address], [place], [place]  [address] Phone: [phone]([phone]; Fax: [phone]

## 2016-06-28 NOTE — Patient Instructions (Signed)
**Note De-identified [name] Obfuscation** Medication Instructions:  Same-no changes  Labwork: None  Testing/Procedures: None  Follow-Up: As needed     If you need a refill on your cardiac medications before your next appointment, please call your pharmacy.   

## 2016-07-02 ENCOUNTER — Ambulatory Visit: Payer: BC Managed Care – PPO | Attending: Physical Medicine & Rehabilitation | Admitting: Physical Therapy

## 2016-07-02 DIAGNOSIS — R42 Dizziness and giddiness: Secondary | ICD-10-CM

## 2016-07-03 NOTE — Therapy (Signed)
[facility] [address] [place], [name], [address] Phone: [phone]   Fax:  [phone]  Physical Therapy Treatment  Patient Details  Name: Rosario Kushner MRN: [id number] Date of Birth: Mar 05, 1951 Referring Provider: Charlett Blake, MD  Encounter Date: 07/02/2016      PT End of Session - 07/03/16 1287    Visit Number 5   Number of Visits 6   Date for PT Re-Evaluation 07/02/16   PT Start Time 1401   PT Stop Time 8676   PT Time Calculation (min) 44 min      Past Medical History  Diagnosis Date  . Healthy adult     No past surgical history on file.  There were no vitals filed for this visit.      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 No/denies       NeuroRe-ed:  SOT score WNL's - composite score 80/100 with N= 68/100  Somatosensory, visual and vestibular inputs WNL's    Self Care;  Discussed LTG's, HEP reviewed;  [place] 2%   FOTO score 93/100; risk adjusted statistical FOTO score 68/100                        PT Long Term Goals - 07/02/16 1407    PT LONG TERM GOAL #1   Title independent with HEP (07/02/16)   Baseline met 07-02-16   Status Achieved   PT LONG TERM GOAL #2   Title perform sensory organization test with goals to be written as indicated (7/317)   Baseline Met on 06/05/2016- see new LTG #5   Status Achieved   PT LONG TERM GOAL #3   Title improve DHI (FOTO) to < 50 for improved function (07/02/16)   Baseline 2% DHI score 07-02-16   Status Achieved   PT LONG TERM GOAL #4   Title ambulate > 1000' without device on  various indoor/outdoor surfaces independently for return to yardwork activities (07/02/16)   Baseline pt reports walking 1 - 1 1/2 mile/day depending on weather - 07-02-16   Status Achieved   PT LONG TERM GOAL #5   Title The patient will improve sensory organization test vestibular use to [facility] for age/height.   Status Achieved               Plan - 07/03/16 1835    Clinical Impression Statement Pt has met all LTG's; SOT score is WNL's; balance and gait are WNL's with no physical deficits present at this time   Rehab Potential Good   PT Frequency 1x / week   PT Duration 6 weeks   PT Treatment/Interventions ADLs/Self Care Home Management;Vestibular;Visual/perceptual remediation/compensation;Patient/family education;Neuromuscular re-education;Balance training;Therapeutic exercise;Therapeutic activities;Functional mobility training;Gait training;Stair training   PT Next Visit Plan N/A - D/C   Consulted and Agree with Plan of Care Patient      Patient will benefit from skilled therapeutic intervention in order to improve the following deficits and impairments:  Decreased mobility, Dizziness  Visit Diagnosis: Dizziness and giddiness       G-Codes -  07/03/16 1836    Functional Assessment Tool Used DHI 2% ; SOT WNL's   Functional Limitation Mobility   Mobility: Walking and Moving Around Goal Status (A2633) CK   Mobility: Walking and Moving Around Discharge Status [phone]) [place]      Problem List Patient Active Problem List   Diagnosis Date Noted  . 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 HLD (hyperlipidemia)   . Nausea without vomiting 05/16/2016  . Cerebellar stroke ([place]) 05/15/2016  . Acute right PCA stroke ([place]) 05/10/2016  . Acute CVA (cerebrovascular accident) ([place]) 05/10/2016  . Emesis   . Vertigo 05/08/2016  . Intractable nausea and vomiting 05/08/2016     PHYSICAL THERAPY DISCHARGE SUMMARY  Visits from Start of Care: 5  Current functional level related to goals  / functional outcomes: All LTG's achieved - see above for progress towards goals   Remaining deficits: None regarding mobility;  No dizziness reported at this time:  [place] [place] [place] only 2% at this time   Education / Equipment: Pt has been instructed in a HEP consisting of vestibular exercises Plan: Patient agrees to discharge.  Patient goals were met. Patient is being discharged due to meeting the stated rehab goals.  ?????       YBWLSL, HTDSK AJGOTLX, PT 07/03/2016, [facility] [address] [place], [name], [address] Phone: [phone]   Fax:  [phone]  Name: Jayvion Stefanski MRN: [id number] Date of Birth: Aug 19, 1951

## 2016-07-09 ENCOUNTER — Encounter: Payer: Self-pay | Admitting: Neurology

## 2016-07-09 ENCOUNTER — Ambulatory Visit (INDEPENDENT_AMBULATORY_CARE_PROVIDER_SITE_OTHER): Payer: BC Managed Care – PPO | Admitting: Neurology

## 2016-07-09 VITALS — BP 100/68 | HR 76 | Ht 73.0 in | Wt 195.2 lb

## 2016-07-09 DIAGNOSIS — I639 Cerebral infarction, unspecified: Secondary | ICD-10-CM | POA: Diagnosis not present

## 2016-07-09 DIAGNOSIS — E785 Hyperlipidemia, unspecified: Secondary | ICD-10-CM

## 2016-07-09 NOTE — Progress Notes (Signed)
STROKE NEUROLOGY FOLLOW UP NOTE  NAME: Tony LoJohnny Cheslock DOB: 05/10/1951  REASON FOR VISIT: stroke follow up HISTORY FROM: pt and wife and chart  Today we had the pleasure of seeing [name] in follow-up at our Neurology Clinic. Pt was accompanied by wife.   History Summary Mr. Tony LoJohnny Wiker is a 65 y.o. male with no significant past medical history was admitted on 05/08/16 for sudden onset vertigo and nausea. MRI showed bilateral PICA infarcts, R>L. And CTA head and neck showed possible right PICA occlusion vs. High grade stenosis, otherwise, extracranial and intracranial vessels are free of atherosclerosis. Other stroke work up including TTE, LE venous doppler, A1C all negative. Except LDL 117. His symptoms improved and d/c to CIR on ASA and lipitor and recommendation to do 30 day cardiac monitoring as outpt. During CIR admission, he had one episode of recurrent vertigo, MRI negative, and symptoms recovered quick. It considered as recrudescence of last stroke. He was then d/c from CIR to home in good condition.  Interval History During the interval time, the patient has been doing well. No recurrent symptoms. He all feels well without complains. He has not back to drive or work yet. He followed with Dr. Eldridge DaceVaranasi once in clinic and reviewed 30 day cardiac event monitoring and did not see afib. However, as per pt, the external monitoring was not a good experience due to constant beeping of the machine. BP 100/68.   REVIEW OF SYSTEMS: Full 14 system review of systems performed and notable only for those listed below and in HPI above, all others are negative:  Constitutional:   Cardiovascular:  Ear/Nose/Throat:   Skin:  Eyes:   Respiratory:   Gastroitestinal:   Genitourinary:  Hematology/Lymphatic:   Endocrine:  Musculoskeletal:   Allergy/Immunology:   Neurological:   Psychiatric:  Sleep:   The following represents the patient's updated allergies and side effects list: No Known  Allergies  The neurologically relevant items on the patient's problem list were reviewed on today's visit.  Neurologic Examination  A problem focused neurological exam (12 or more points of the single system neurologic examination, vital signs counts as 1 point, cranial nerves count for 8 points) was performed.  Blood pressure 100/68, pulse 76, height 6\' 1"  (1.854 m), weight 195 lb 3.2 oz (88.542 kg).  General - Well nourished, well developed, in no apparent distress.  Ophthalmologic - Sharp disc margins OU. Fundi not visualized due to .  Cardiovascular - Regular rate and rhythm with no murmur.  Mental Status -  Level of arousal and orientation to time, place, and person were intact. Language including expression, naming, repetition, comprehension was assessed and found intact. Fund of Knowledge was assessed and was intact.  Cranial Nerves II - XII - II - Visual field intact OU. III, IV, VI - Extraocular movements intact. V - Facial sensation intact bilaterally. VII - Facial movement intact bilaterally. VIII - Hearing & vestibular intact bilaterally. X - Palate elevates symmetrically. XI - Chin turning & shoulder shrug intact bilaterally. XII - Tongue protrusion intact.  Motor Strength - The patient's strength was normal in all extremities and pronator drift was absent.  Bulk was normal and fasciculations were absent.   Motor Tone - Muscle tone was assessed at the neck and appendages and was normal.  Reflexes - The patient's reflexes were 1+ in all extremities and he had no pathological reflexes.  Sensory - Light touch, temperature/pinprick, vibration and proprioception, and Romberg testing were assessed and were normal.  Coordination - The patient had normal movements in the hands and feet with no ataxia or dysmetria.  Tremor was absent.  Gait and Station - The patient's transfers, posture, gait, station, and turns were observed as normal.   Functional score  mRS =  0   0 - No symptoms.   1 - No significant disability. Able to carry out all usual activities, despite some symptoms.   2 - Slight disability. Able to look after own affairs without assistance, but unable to carry out all previous activities.   3 - Moderate disability. Requires some help, but able to walk unassisted.   4 - Moderately severe disability. Unable to attend to own bodily needs without assistance, and unable to walk unassisted.   5 - Severe disability. Requires constant nursing care and attention, bedridden, incontinent.   6 - Dead.   NIH Stroke Scale = 0   Data reviewed: I personally reviewed the images and agree with the radiology interpretations.  Ct Head Wo Contrast 05/08/2016 Mildly limited examination demonstrating mild atrophy with no acute abnormality.   05/17/16 1. Expected evolution of subacute infarction in the RIGHT cerebellum. 2. No evidence of new infarction or hemorrhage. 3. No new mass effect.  Mr Brain Wo Contrast 05/10/2016 Large acute RIGHT cerebellar infarct, nonhemorrhagic, PICA territory. Much smaller infarct of the LEFT medial inferior cerebellum, also PICA territory. Mild mass effect on the fourth ventricle. The patient should be observed for signs of developing obstructive hydrocephalus. A call is in to the care team.   05/17/16 - Expected evolution of now subacute cerebellar infarcts. No evidence of infarct extension or new infarct.  CTA head ane neck  05/10/16- Acute PICA infarct right greater than left. There appears to be decreased flow in the right PICA which may be related to the acute infarct and partial occlusion. No filling defect or thrombus identified. No vertebral artery stenosis or dissection is identified as a cause of the infarct. No significant carotid or vertebral artery stenosis in the neck. Negative for dissection.  TTE - - Left ventricle: The cavity size was normal. Wall thickness was  increased increased in a  pattern of mild to moderate LVH.  Systolic function was normal. The estimated ejection fraction was  in the range of 55% to 60%. Wall motion was normal; there were no  regional wall motion abnormalities. Doppler parameters are  consistent with abnormal left ventricular relaxation (grade 1  diastolic dysfunction). - Mitral valve: There was mild regurgitation. - Left atrium: The atrium was mildly dilated. Impressions: - No cardiac source of emboli was indentified.  LE venous doppler - Bilateral: No evidence of DVT, superficial thrombosis, or Baker's Cyst.  Component     Latest Ref Rng 05/09/2016  Cholesterol     0 - 200 mg/dL 161  Triglycerides     <150 mg/dL 34  HDL Cholesterol     >40 mg/dL 41  Total CHOL/HDL Ratio      4.0  VLDL     0 - 40 mg/dL 7  LDL (calc)     0 - 99 mg/dL 096 (H)  Hemoglobin E4V     4.8 - 5.6 % 5.3  Mean Plasma Glucose      105    Assessment: As you may recall, he is a 65 y.o. Caucasian male with no PMH admitted on 05/08/16 for bilateral cerebellar infarcts, R>L, consistent with right PICA territory. CTA head and neck showed possible right PICA occlusion vs. High grade stenosis, otherwise,  extracranial and intracranial vessels are free of atherosclerosis. Other stroke work up including TTE, LE venous doppler, A1C all negative, except LDL 117. He was put on ASA and lipitor and recommendation to do 30 day cardiac monitoring on discharge. During CIR admission, he had one episode of recurrent vertigo, MRI and CT negative, and symptoms recovered quick. It was considered as recrudescence of the stroke. During the interval time, the patient has been doing well. No recurrent symptoms. 30 day cardiac monitoring negative for afib. He has not back to drive or work yet.   Pt stroke etiology still not very clear. All work up essentially normal except LDL 117. Will prefer to have loop recorder placement to rule out afib. Pt so far declined TEE procedure.   Plan:  -  continue ASA and lipitor for stroke prevention - will recommend loop recorder for afib detection. Will refer to Dr. Johney Frame to consider. - follow up with cardiology as scheduled - low suspicious for abnormalities on TEE, and pt declined for TEE also - Follow up with your primary care physician for stroke risk factor modification. Recommend maintain blood pressure goal <130/80, diabetes with hemoglobin A1c goal below 7.0% and lipids with LDL cholesterol goal below 70 mg/dL.  - check BP at home  - No restriction for driving, but recommend to drive with family members on board for 2-3 times initially. If both parties feel comfortable of your driving, you can drive alone after. However, you are recommended to drive during the day not at night, no long distance and drive in familiar roads. 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   I spent more than 25 minutes of face to face time with the patient. Greater than 50% of time was spent in counseling and coordination of care. We discussed further stroke work up and back to driving and work.   Orders Placed This Encounter  Procedures  . Ambulatory referral to Cardiac Electrophysiology    Referral Priority:  Routine    Referral Type:  Consultation    Referral Reason:  Specialty Services Required    Referred to Provider:  Hillis Range, MD    Requested Specialty:  Cardiology    Number of Visits Requested:  1    Meds ordered this encounter  Medications  . acetaminophen (TYLENOL) 500 MG tablet    Sig: Take 500 mg by mouth every 6 (six) hours as needed.    Patient Instructions  - continue ASA and lipitor for stroke prevention - will recommend loop recorder for afib detection. Will refer to Dr. Johney Frame to consider. - follow up with cardiology as scheduled - low suspicious for abnormalities on TEE. Will try to avoid if able - Follow up with your primary care physician for stroke risk factor modification.  Recommend maintain blood pressure goal <130/80, diabetes with hemoglobin A1c goal below 7.0% and lipids with LDL cholesterol goal below 70 mg/dL.  - check BP at home  - No restriction for driving, but recommend to drive with family members on board for 2-3 times initially. If both parties feel comfortable of your driving, you can drive alone after. However, you are recommended to drive during the day not at night, no long distance and drive in familiar roads. 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     Marvel Plan, MD PhD [facility] [address],  [place], [place] [address] [phone]

## 2016-07-09 NOTE — Patient Instructions (Signed)
-   continue ASA and lipitor for stroke prevention - will recommend loop recorder for afib detection. Will refer to Dr. Johney FrameAllred to consider. - follow up with cardiology as scheduled - low suspicious for abnormalities on TEE. Will try to avoid if able - Follow up with your primary care physician for stroke risk factor modification. Recommend maintain blood pressure goal <130/80, diabetes with hemoglobin A1c goal below 7.0% and lipids with LDL cholesterol goal below 70 mg/dL.  - check BP at home  - No restriction for driving, but recommend to drive with family members on board for 2-3 times initially. If both parties feel comfortable of your driving, you can drive alone after. However, you are recommended to drive during the day not at night, no long distance and drive in familiar roads. 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

## 2016-07-17 ENCOUNTER — Encounter: Payer: Self-pay | Admitting: *Deleted

## 2016-07-17 ENCOUNTER — Ambulatory Visit (INDEPENDENT_AMBULATORY_CARE_PROVIDER_SITE_OTHER): Payer: BC Managed Care – PPO | Admitting: Internal Medicine

## 2016-07-17 ENCOUNTER — Encounter: Payer: Self-pay | Admitting: Internal Medicine

## 2016-07-17 VITALS — BP 102/68 | HR 64 | Ht 73.0 in | Wt 194.6 lb

## 2016-07-17 DIAGNOSIS — I63341 Cerebral infarction due to thrombosis of right cerebellar artery: Secondary | ICD-10-CM | POA: Diagnosis not present

## 2016-07-17 NOTE — Patient Instructions (Signed)
Medication Instructions: - Your physician recommends that you continue on your current medications as directed. Please refer to the Current Medication list given to you today.  Labwork: - none  Procedures/Testing: - Your physician has recommended that you have an implantable loop recorder (LINQ) placed  Follow-Up: - Your physician recommends that you schedule a follow-up appointment in: 10-14 days (from 07/20/16) for a wound check with the device clinic.  Any Additional Special Instructions Will Be Listed Below (If Applicable).     If you need a refill on your cardiac medications before your next appointment, please call your pharmacy.

## 2016-07-17 NOTE — Progress Notes (Signed)
ELECTROPHYSIOLOGY CONSULT NOTE  Patient ID: [name], MRN: [id number], DOB/AGE: Nov 23, 1951 65 y.o. Admit date: (Not on file) Date of Consult: 07/17/2016  Primary Physician: Tony Fick, MD Primary Cardiologist:new Consulting Physician Xu  Chief Complaint: Loop recorder   HPI [name] is a 65 y.o. male  Seen for consideration of a loop recorder insertion  He has a history of acute stroke 5/17 MRI at that point showing   bilateral PICA infarcts. A CTA showed possible right sided PICA occlusion  Echo normal LV function   The patient denies chest pain, shortness of breath, nocturnal dyspnea, orthopnea or peripheral edema.  There have been no palpitations, lightheadedness or syncope.   No hx of HTN CAD normal lipids   Past Medical History  Diagnosis Date  . Healthy adult   . Stroke [facility])       Surgical History: No past surgical history on file.   Home Meds: Prior to Admission medications   Medication Sig Start Date End Date Taking? Authorizing Provider  acetaminophen (TYLENOL) 500 MG tablet Take 500 mg by mouth every 6 (six) hours as needed (pain).     Historical Provider, MD  aspirin EC 325 MG EC tablet Take 1 tablet (325 mg total) by mouth daily. 05/15/16   Rolly Salter, MD  atorvastatin (LIPITOR) 40 MG tablet Take 1 tablet (40 mg total) by mouth daily at 6 PM. 05/18/16   Evlyn Kanner Love, PA-C  meclizine (ANTIVERT) 12.5 MG tablet Take 1 tablet (12.5 mg total) by mouth 3 (three) times daily as needed for dizziness. 05/18/16   Evlyn Kanner Love, PA-C  ondansetron (ZOFRAN) 4 MG tablet Take 1 tablet (4 mg total) by mouth every 6 (six) hours as needed for nausea. 05/22/16   [name], MD    Allergies: No Known Allergies  Social History   Social History  . Marital Status: Single    Spouse Name: N/A  . Number of Children: N/A  . Years of Education: N/A   Occupational History  . Not on file.   Social History Main Topics  . Smoking status: Never  Smoker   . Smokeless tobacco: Never Used  . Alcohol Use: No  . Drug Use: No  . Sexual Activity: Not on file   Other Topics Concern  . Not on file   Social History Narrative     Family History  Problem Relation Age of Onset  . Stroke Mother 57  . Diabetes Father   . Cancer Brother     lung  . Heart attack Neg Hx   . Hypertension Neg Hx      ROS:  Please see the history of present illness.     All other systems reviewed and negative.    Physical Exam: Blood pressure 102/68, pulse 64, height  (1.854 m), weight 194 lb 9.6 oz (88.27 kg), SpO2 96 %. General: Well developed, well nourished male in no acute distress. Head: Normocephalic, atraumatic, sclera non-icteric, no xanthomas, nares are without discharge. EENT: normal  Lymph Nodes:  none Neck: Negative for carotid bruits. JVD not elevated. Back:without scoliosis kyphosis Lungs: Clear bilaterally to auscultation without wheezes, rales, or rhonchi. Breathing is unlabored. Heart: RRR with S1 S2. No  murmur . No rubs, or gallops appreciated. Abdomen: Soft, non-tender, non-distended with normoactive bowel sounds. No hepatomegaly. No rebound/guarding. No obvious abdominal masses. Msk:  Strength and tone appear normal for age. Extremities: No clubbing or cyanosis. No  edema.  Distal pedal  pulses are 2+ and equal bilaterally. Skin: Warm and Dry Neuro: Alert and oriented X 3. CN III-XII intact Grossly normal sensory and motor function . Psych:  Responds to questions appropriately with a normal affect.      Labs: Cardiac Enzymes No results for input(s): CKTOTAL, CKMB, TROPONINI in the last 72 hours. CBC Lab Results  Component Value Date   WBC 7.7 05/16/2016   HGB 15.6 05/16/2016   HCT 45.9 05/16/2016   MCV 95.8 05/16/2016   PLT 205 05/16/2016   PROTIME: No results for input(s): LABPROT, INR in the last 72 hours. Chemistry No results for input(s): NA, K, CL, CO2, BUN, CREATININE, CALCIUM, PROT, BILITOT, ALKPHOS, ALT,  AST, GLUCOSE in the last 168 hours.  Invalid input(s): LABALBU A TEELipids Lab Results  Component Value Date   CHOL 165 05/09/2016   HDL 41 05/09/2016   LDLCALC 117* 05/09/2016   TRIG 34 05/09/2016   BNP No results found for: PROBNP Thyroid Function Tests: No results for input(s): TSH, T4TOTAL, T3FREE, THYROIDAB in the last 72 hours.  Invalid input(s): FREET3 Miscellaneous No results found for: DDIMER  Radiology/Studies:  No results found.  EKG:  NSR  17/09/41   Assessment and Plan:  Cryptogenic stroke   The patient has had multiple strokes. There is been no identified causes. It is appropriate at this juncture to proceed with loop recorder insertion. We have discussed the implications of detecting atrial fibrillation in terms of switching him from antiplatelet therapy--anticoagulation   We have reviewed risks and benefits of insertion   Sherryl MangesSteven Naseem Varden

## 2016-07-18 NOTE — Telephone Encounter (Signed)
Pt's wife called sts they are going out of town Monday morning and [place] [facility], she would like to get it before Friday 7/21, [facility] Please call her

## 2016-07-19 NOTE — Telephone Encounter (Signed)
LFt vm that [employer] forms will be ready tomorrow. LFt vm that they need to pick up before 12pm and pay 50.00 dollars to have the form filled out.

## 2016-07-19 NOTE — Telephone Encounter (Signed)
Message sent to Dr. Roda ShuttersXu to do form and sign. Form will be ready on Friday. PT needs to pay 50.00 on Friday.

## 2016-07-20 ENCOUNTER — Encounter (HOSPITAL_COMMUNITY): Payer: Self-pay | Admitting: Internal Medicine

## 2016-07-20 ENCOUNTER — Ambulatory Visit (HOSPITAL_COMMUNITY)
Admission: RE | Admit: 2016-07-20 | Discharge: 2016-07-20 | Disposition: A | Discharge status: Home / Self Care | Payer: BC Managed Care – PPO | Source: Ambulatory Visit | Attending: Internal Medicine | Admitting: Internal Medicine

## 2016-07-20 ENCOUNTER — Encounter (HOSPITAL_COMMUNITY)
Admission: RE | Disposition: A | Discharge status: Home / Self Care | Payer: Self-pay | Source: Ambulatory Visit | Attending: Internal Medicine

## 2016-07-20 DIAGNOSIS — I639 Cerebral infarction, unspecified: Secondary | ICD-10-CM | POA: Insufficient documentation

## 2016-07-20 DIAGNOSIS — Z7982 Long term (current) use of aspirin: Secondary | ICD-10-CM | POA: Diagnosis not present

## 2016-07-20 HISTORY — PX: EP IMPLANTABLE DEVICE: SHX172B

## 2016-07-20 SURGERY — LOOP RECORDER INSERTION

## 2016-07-20 MED ORDER — LIDOCAINE-EPINEPHRINE 1 %-1:100000 IJ SOLN
INTRAMUSCULAR | Status: DC | PRN
  Administered 2016-07-20: 10 mL

## 2016-07-20 MED ORDER — LIDOCAINE-EPINEPHRINE 1 %-1:100000 IJ SOLN
INTRAMUSCULAR | Status: AC
  Filled 2016-07-20: qty 1

## 2016-07-20 SURGICAL SUPPLY — 2 items
LOOP REVEAL LINQSYS (Prosthesis & Implant Heart) ×2 IMPLANT
PACK LOOP INSERTION (CUSTOM PROCEDURE TRAY) ×2 IMPLANT

## 2016-07-20 NOTE — Interval H&P Note (Signed)
History and Physical Interval Note:  07/20/2016 12:50 PM  [name]  has presented today for surgery, with the diagnosis of stroke  The various methods of treatment have been discussed with the patient and family. After consideration of risks, benefits and other options for treatment, the patient has consented to  Procedure(s): Loop Recorder Insertion (N/A) as a surgical intervention .  The patient's history has been reviewed, patient examined, no change in status, stable for surgery.  I have reviewed the patient's chart and labs.  Questions were answered to the patient's satisfaction.     Sherryl MangesSteven Hoang Pettingill

## 2016-07-20 NOTE — H&P (View-Only) (Signed)
    ELECTROPHYSIOLOGY CONSULT NOTE  Patient ID: Kailash Inks, MRN: [id number], DOB/AGE: 66/27/1952 66 y.o. Admit date: (Not on file) Date of Consult: 07/17/2016  Primary Physician: RAMACHANDRAN,AJITH, MD Primary Cardiologist:new Consulting Physician Xu  Chief Complaint: Loop recorder   HPI [name] is a 65 y.o. male  Seen for consideration of a loop recorder insertion  He has a history of acute stroke 5/17 MRI at that point showing   bilateral PICA infarcts. A CTA showed possible right sided PICA occlusion  Echo normal LV function   The patient denies chest pain, shortness of breath, nocturnal dyspnea, orthopnea or peripheral edema.  There have been no palpitations, lightheadedness or syncope.   No hx of HTN CAD normal lipids   Past Medical History  Diagnosis Date  . Healthy adult   . Stroke (HCC)       Surgical History: No past surgical history on file.   Home Meds: Prior to Admission medications   Medication Sig Start Date End Date Taking? Authorizing Provider  acetaminophen (TYLENOL) 500 MG tablet Take 500 mg by mouth every 6 (six) hours as needed (pain).     Historical Provider, MD  aspirin EC 325 MG EC tablet Take 1 tablet (325 mg total) by mouth daily. 05/15/16   Pranav M Patel, MD  atorvastatin (LIPITOR) 40 MG tablet Take 1 tablet (40 mg total) by mouth daily at 6 PM. 05/18/16   Pamela S Love, PA-C  meclizine (ANTIVERT) 12.5 MG tablet Take 1 tablet (12.5 mg total) by mouth 3 (three) times daily as needed for dizziness. 05/18/16   Pamela S Love, PA-C  ondansetron (ZOFRAN) 4 MG tablet Take 1 tablet (4 mg total) by mouth every 6 (six) hours as needed for nausea. 05/22/16   Andrew E Kirsteins, MD    Allergies: No Known Allergies  Social History   Social History  . Marital Status: Single    Spouse Name: N/A  . Number of Children: N/A  . Years of Education: N/A   Occupational History  . Not on file.   Social History Main Topics  . Smoking status: Never  Smoker   . Smokeless tobacco: Never Used  . Alcohol Use: No  . Drug Use: No  . Sexual Activity: Not on file   Other Topics Concern  . Not on file   Social History Narrative     Family History  Problem Relation Age of Onset  . Stroke Mother 83  . Diabetes Father   . Cancer Brother     lung  . Heart attack Neg Hx   . Hypertension Neg Hx      ROS:  Please see the history of present illness.     All other systems reviewed and negative.    Physical Exam: Blood pressure 102/68, pulse 64, height 6' 1" (1.854 m), weight 194 lb 9.6 oz (88.27 kg), SpO2 96 %. General: Well developed, well nourished male in no acute distress. Head: Normocephalic, atraumatic, sclera non-icteric, no xanthomas, nares are without discharge. EENT: normal  Lymph Nodes:  none Neck: Negative for carotid bruits. JVD not elevated. Back:without scoliosis kyphosis Lungs: Clear bilaterally to auscultation without wheezes, rales, or rhonchi. Breathing is unlabored. Heart: RRR with S1 S2. No  murmur . No rubs, or gallops appreciated. Abdomen: Soft, non-tender, non-distended with normoactive bowel sounds. No hepatomegaly. No rebound/guarding. No obvious abdominal masses. Msk:  Strength and tone appear normal for age. Extremities: No clubbing or cyanosis. No  edema.  Distal pedal   pulses are 2+ and equal bilaterally. Skin: Warm and Dry Neuro: Alert and oriented X 3. CN III-XII intact Grossly normal sensory and motor function . Psych:  Responds to questions appropriately with a normal affect.      Labs: Cardiac Enzymes No results for input(s): CKTOTAL, CKMB, TROPONINI in the last 72 hours. CBC Lab Results  Component Value Date   WBC 7.7 05/16/2016   HGB 15.6 05/16/2016   HCT 45.9 05/16/2016   MCV 95.8 05/16/2016   PLT 205 05/16/2016   PROTIME: No results for input(s): LABPROT, INR in the last 72 hours. Chemistry No results for input(s): NA, K, CL, CO2, BUN, CREATININE, CALCIUM, PROT, BILITOT, ALKPHOS, ALT,  AST, GLUCOSE in the last 168 hours.  Invalid input(s): LABALBU A TEELipids Lab Results  Component Value Date   CHOL 165 05/09/2016   HDL 41 05/09/2016   LDLCALC 117* 05/09/2016   TRIG 34 05/09/2016   BNP No results found for: PROBNP Thyroid Function Tests: No results for input(s): TSH, T4TOTAL, T3FREE, THYROIDAB in the last 72 hours.  Invalid input(s): FREET3 Miscellaneous No results found for: DDIMER  Radiology/Studies:  No results found.  EKG:  NSR  17/09/41   Assessment and Plan:  Cryptogenic stroke   The patient has had multiple strokes. There is been no identified causes. It is appropriate at this juncture to proceed with loop recorder insertion. We have discussed the implications of detecting atrial fibrillation in terms of switching him from antiplatelet therapy--anticoagulation   We have reviewed risks and benefits of insertion   Sherryl MangesSteven Klein

## 2016-07-20 NOTE — Telephone Encounter (Signed)
[employer] form paid 50.00 dollars and given to patient and wife. Will be sent to medical records.

## 2016-07-23 DIAGNOSIS — Z0289 Encounter for other administrative examinations: Secondary | ICD-10-CM

## 2016-07-24 ENCOUNTER — Telehealth: Payer: Self-pay

## 2016-07-24 NOTE — Telephone Encounter (Signed)
DMV Form given to patient on July 21.

## 2016-07-25 ENCOUNTER — Ambulatory Visit: Payer: Self-pay | Admitting: Neurology

## 2016-07-30 ENCOUNTER — Encounter: Payer: Self-pay | Admitting: Internal Medicine

## 2016-07-30 ENCOUNTER — Ambulatory Visit (INDEPENDENT_AMBULATORY_CARE_PROVIDER_SITE_OTHER): Payer: BC Managed Care – PPO | Admitting: *Deleted

## 2016-07-30 DIAGNOSIS — I63341 Cerebral infarction due to thrombosis of right cerebellar artery: Secondary | ICD-10-CM

## 2016-07-30 LAB — CUP PACEART INCLINIC DEVICE CHECK: Date Time Interrogation Session: 20170731184952

## 2016-07-30 NOTE — Progress Notes (Signed)
Wound check appointment. Steri-strips removed. Wound without redness or edema. Incision edges approximated, wound well healed. Normal device function. Battery status: good. R-waves 0.39mV. No symptom, tachy, pause, brady, or AF episodes. Pause and brady detection reprogrammed off (no hx of syncope). Monthly summary reports and ROV with SK PRN.

## 2016-07-30 NOTE — Addendum Note (Signed)
Addended by: Reesa Chew on: 07/30/2016 05:44 PM   Modules accepted: Orders

## 2016-08-20 ENCOUNTER — Ambulatory Visit (INDEPENDENT_AMBULATORY_CARE_PROVIDER_SITE_OTHER): Payer: BC Managed Care – PPO | Admitting: *Deleted

## 2016-08-20 DIAGNOSIS — I63341 Cerebral infarction due to thrombosis of right cerebellar artery: Secondary | ICD-10-CM

## 2016-08-20 NOTE — Progress Notes (Signed)
Carelink Summary Report / Loop Recorder 

## 2016-09-16 LAB — CUP PACEART REMOTE DEVICE CHECK: Date Time Interrogation Session: 20170820163533

## 2016-09-16 NOTE — Progress Notes (Signed)
Carelink summary report received. Battery status OK. Normal device function. No new symptom episodes, tachy episodes, brady, or pause episodes. No new AF episodes. Good histograms. Monthly summary reports and ROV/PRN 

## 2016-09-18 ENCOUNTER — Ambulatory Visit (INDEPENDENT_AMBULATORY_CARE_PROVIDER_SITE_OTHER): Payer: BC Managed Care – PPO | Admitting: *Deleted

## 2016-09-18 DIAGNOSIS — I63341 Cerebral infarction due to thrombosis of right cerebellar artery: Secondary | ICD-10-CM

## 2016-09-18 NOTE — Progress Notes (Signed)
Carelink Summary Report / Loop Recorder 

## 2016-10-13 LAB — CUP PACEART REMOTE DEVICE CHECK: Date Time Interrogation Session: 20170919163616

## 2016-10-13 NOTE — Progress Notes (Signed)
[facility] summary report received. Battery status OK. Normal device function. No new symptom episodes, tachy episodes, brady, or pause episodes. No new AF episodes. Monthly summary reports and ROV/PRN 

## 2016-10-18 ENCOUNTER — Ambulatory Visit (INDEPENDENT_AMBULATORY_CARE_PROVIDER_SITE_OTHER): Payer: BC Managed Care – PPO | Admitting: *Deleted

## 2016-10-18 DIAGNOSIS — I63341 Cerebral infarction due to thrombosis of right cerebellar artery: Secondary | ICD-10-CM | POA: Diagnosis not present

## 2016-10-18 NOTE — Progress Notes (Signed)
Carelink Summary Report / Loop Recorder 

## 2016-11-12 ENCOUNTER — Encounter: Payer: Self-pay | Admitting: Neurology

## 2016-11-12 ENCOUNTER — Telehealth: Payer: Self-pay | Admitting: Cardiology

## 2016-11-12 ENCOUNTER — Ambulatory Visit (INDEPENDENT_AMBULATORY_CARE_PROVIDER_SITE_OTHER): Payer: BC Managed Care – PPO | Admitting: Neurology

## 2016-11-12 VITALS — BP 110/76 | HR 58 | Ht 73.0 in | Wt 188.6 lb

## 2016-11-12 DIAGNOSIS — E785 Hyperlipidemia, unspecified: Secondary | ICD-10-CM | POA: Diagnosis not present

## 2016-11-12 DIAGNOSIS — I639 Cerebral infarction, unspecified: Secondary | ICD-10-CM

## 2016-11-12 NOTE — Telephone Encounter (Signed)
Informed pt wife that pt does not need to come to the office for his 11-19-16 appt b/c it will be done w/ his home monitor. Pt wife verbalized understanding and is going to keep on 11-19-2016.

## 2016-11-12 NOTE — Progress Notes (Signed)
STROKE NEUROLOGY FOLLOW UP NOTE  NAME: Tony LoJohnny Dray DOB: 11/29/1951  REASON FOR VISIT: stroke follow up HISTORY FROM: pt and wife and chart  Today we had the pleasure of seeing [name] in follow-up at our Neurology Clinic. Pt was accompanied by wife.   History Summary [name] is a 65 y.o. male with no significant past medical history was admitted on 05/08/16 for sudden onset vertigo and nausea. MRI showed bilateral PICA infarcts, R>L. And CTA head and neck showed possible right PICA occlusion vs. High grade stenosis, otherwise, extracranial and intracranial vessels are free of atherosclerosis. Other stroke work up including TTE, LE venous doppler, A1C all negative. Except LDL 117. His symptoms improved and d/c to CIR on ASA and lipitor and recommendation to do 30 day cardiac monitoring as outpt. During CIR admission, he had one episode of recurrent vertigo, MRI negative, and symptoms recovered quick. It considered as recrudescence of last stroke. He was then d/c from CIR to home in good condition.  07/09/16 follow up - the patient has been doing well. No recurrent symptoms. He all feels well without complains. He has not back to drive or work yet. He followed with Dr. Eldridge DaceVaranasi once in clinic and reviewed 30 day cardiac event monitoring and did not see afib. However, as per pt, the external monitoring was not a good experience due to constant beeping of the machine. BP 100/68.   Interval History During the interval time, pt has been doing well. Had loop recorder placed and so far no afib. 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 So far he is not working and taking his sick leave days. He is working on his disability from the state. BP today 110/76. No other complains.  REVIEW OF SYSTEMS: Full 14 system review of systems performed and notable only for those listed below and in HPI above, all others are negative:  Constitutional:   fatigue Cardiovascular:  Ear/Nose/Throat:   Skin:  Eyes:   Respiratory:   Gastroitestinal:   Genitourinary:  Hematology/Lymphatic:   Endocrine:  Musculoskeletal:   Allergy/Immunology:   Neurological:   Psychiatric:  Sleep:   The following represents the patient's updated allergies and side effects list: No Known Allergies  The neurologically relevant items on the patient's problem list were reviewed on today's visit.  Neurologic Examination  A problem focused neurological exam (12 or more points of the single system neurologic examination, vital signs counts as 1 point, cranial nerves count for 8 points) was performed.  Blood pressure 110/76, pulse (!) 58, height 6\' 1"  (1.854 m), weight 188 lb 9.6 oz (85.5 kg).  General - Well nourished, well developed, in no apparent distress.  Ophthalmologic - Sharp disc margins OU. Fundi not visualized due to .  Cardiovascular - Regular rate and rhythm with no murmur.  Mental Status -  Level of arousal and orientation to time, place, and person were intact. Language including expression, naming, repetition, comprehension was assessed and found intact. Fund of Knowledge was assessed and was intact.  Cranial Nerves II - XII - II - Visual field intact OU. III, IV, VI - Extraocular movements intact. V - Facial sensation intact bilaterally. VII - Facial movement intact bilaterally. VIII - Hearing & vestibular intact bilaterally. X - Palate elevates symmetrically. XI - Chin turning & shoulder shrug intact bilaterally. XII - Tongue protrusion intact.  Motor Strength - The patient's strength was normal in  all extremities and pronator drift was absent.  Bulk was normal and fasciculations were absent.   Motor Tone - Muscle tone was assessed at the neck and appendages and was normal.  Reflexes - The patient's reflexes were 1+ in all extremities and he had no pathological reflexes.  Sensory - Light touch, temperature/pinprick, vibration  and proprioception, and Romberg testing were assessed and were normal.    Coordination - The patient had normal movements in the hands and feet with no ataxia or dysmetria.  Tremor was absent.  Gait and Station - The patient's transfers, posture, gait, station, and turns were observed as normal.   Data reviewed: I personally reviewed the images and agree with the radiology interpretations.  Ct Head Wo Contrast 05/08/2016 Mildly limited examination demonstrating mild atrophy with no acute abnormality.   05/17/16 1. Expected evolution of subacute infarction in the RIGHT cerebellum. 2. No evidence of new infarction or hemorrhage. 3. No new mass effect.  Mr Brain Wo Contrast 05/10/2016 Large acute RIGHT cerebellar infarct, nonhemorrhagic, PICA territory. Much smaller infarct of the LEFT medial inferior cerebellum, also PICA territory. Mild mass effect on the fourth ventricle. The patient should be observed for signs of developing obstructive hydrocephalus. A call is in to the care team.   05/17/16 - Expected evolution of now subacute cerebellar infarcts. No evidence of infarct extension or new infarct.  CTA head ane neck  05/10/16- Acute PICA infarct right greater than left. There appears to be decreased flow in the right PICA which may be related to the acute infarct and partial occlusion. No filling defect or thrombus identified. No vertebral artery stenosis or dissection is identified as a cause of the infarct. No significant carotid or vertebral artery stenosis in the neck. Negative for dissection.  TTE - - Left ventricle: The cavity size was normal. Wall thickness was  increased increased in a pattern of mild to moderate LVH.  Systolic function was normal. The estimated ejection fraction was  in the range of 55% to 60%. Wall motion was normal; there were no  regional wall motion abnormalities. Doppler parameters are  consistent with abnormal left ventricular relaxation (grade  1  diastolic dysfunction). - Mitral valve: There was mild regurgitation. - Left atrium: The atrium was mildly dilated. Impressions: - No cardiac source of emboli was indentified.  LE venous doppler - Bilateral: No evidence of DVT, superficial thrombosis, or Baker's Cyst.  Component     Latest Ref Rng 05/09/2016  Cholesterol     0 - 200 mg/dL 161  Triglycerides     <150 mg/dL 34  HDL Cholesterol     >40 mg/dL 41  Total CHOL/HDL Ratio      4.0  VLDL     0 - 40 mg/dL 7  LDL (calc)     0 - 99 mg/dL 096 (H)  Hemoglobin E4V     4.8 - 5.6 % 5.3  Mean Plasma Glucose      105    Assessment: As you may recall, he is a 65 y.o. Caucasian male with no PMH admitted on 05/08/16 for bilateral cerebellar infarcts, R>L, consistent with right PICA territory. CTA head and neck showed possible right PICA occlusion vs. High grade stenosis, otherwise, extracranial and intracranial vessels are free of atherosclerosis. Other stroke work up including TTE, LE venous doppler, A1C all negative, except LDL 117. He was put on ASA and lipitor and recommendation to do 30 day cardiac monitoring on discharge. During CIR admission, he had  one episode of recurrent vertigo, MRI and CT negative, and symptoms recovered quick. It was considered as recrudescence of the stroke. During the interval time, the patient has been doing well. No recurrent symptoms. 30 day cardiac monitoring negative for afib. Pt stroke etiology still not very clear. All work up essentially normal except LDL 117. Had loop recorder placed and no afib so far. 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   Plan:  - continue ASA and lipitor for stroke prevention - follow up with loop recorder - Follow up with your primary care physician for stroke risk factor modification. Recommend maintain blood pressure goal <130/80, diabetes with hemoglobin A1c goal below 7.0% and lipids with LDL cholesterol goal below  70 mg/dL.  - check BP at home  - No restriction for driving from stroke standpoint.  - healthy diet and regular exercise - follow up in 6 months.   I spent more than 25 minutes of face to face time with the patient. Greater than 50% of time was spent in counseling and coordination of care. We discussed his driving at work and continued loop recorder monitoring.   No orders of the defined types were placed in this encounter.   No orders of the defined types were placed in this encounter.   Patient Instructions  - continue ASA and lipitor for stroke prevention - follow up with loop recorder - Follow up with your primary care physician for stroke risk factor modification. Recommend maintain blood pressure goal <130/80, diabetes with hemoglobin A1c goal below 7.0% and lipids with LDL cholesterol goal below 70 mg/dL.  - check BP at home  - No restriction for driving from stroke standpoint.  - healthy diet and regular exercise - follow up in 6 months.    Marvel PlanJindong Azarie Coriz, MD PhD [facility] [address], [address] [place], [place] [id number] [phone]

## 2016-11-12 NOTE — Telephone Encounter (Signed)
New Message:    Pt wants to change his 11-19-16 appt please.

## 2016-11-12 NOTE — Patient Instructions (Addendum)
-   continue ASA and lipitor for stroke prevention - follow up with loop recorder - Follow up with your primary care physician for stroke risk factor modification. Recommend maintain blood pressure goal <130/80, diabetes with hemoglobin A1c goal below 7.0% and lipids with LDL cholesterol goal below 70 mg/dL.  - check BP at home  - No restriction for driving from stroke standpoint.  - healthy diet and regular exercise - follow up in 6 months.

## 2016-11-17 LAB — CUP PACEART REMOTE DEVICE CHECK
Date Time Interrogation Session: 20171019173809
Implantable Pulse Generator Implant Date: 20170721

## 2016-11-17 NOTE — Progress Notes (Signed)
[facility] summary report received. Battery status OK. Normal device function. No new symptom episodes, tachy episodes, brady, or pause episodes. No new AF episodes. Monthly summary reports and ROV/PRN 

## 2016-11-19 ENCOUNTER — Ambulatory Visit (INDEPENDENT_AMBULATORY_CARE_PROVIDER_SITE_OTHER): Payer: BC Managed Care – PPO | Admitting: *Deleted

## 2016-11-19 DIAGNOSIS — I63341 Cerebral infarction due to thrombosis of right cerebellar artery: Secondary | ICD-10-CM

## 2016-11-19 NOTE — Progress Notes (Signed)
Carelink Summary Report / Loop Recorder 

## 2016-12-17 ENCOUNTER — Ambulatory Visit (INDEPENDENT_AMBULATORY_CARE_PROVIDER_SITE_OTHER): Payer: BC Managed Care – PPO | Admitting: *Deleted

## 2016-12-17 DIAGNOSIS — I63341 Cerebral infarction due to thrombosis of right cerebellar artery: Secondary | ICD-10-CM

## 2016-12-17 NOTE — Progress Notes (Signed)
Carelink Summary Report / Loop Recorder 

## 2016-12-30 LAB — CUP PACEART REMOTE DEVICE CHECK
Date Time Interrogation Session: 20171118184331
Implantable Pulse Generator Implant Date: 20170721

## 2016-12-30 NOTE — Progress Notes (Signed)
[facility] summary report received. Battery status OK. Normal device function. No new symptom episodes, tachy episodes, brady, or pause episodes. No new AF episodes. Monthly summary reports and ROV/PRN 

## 2017-01-16 ENCOUNTER — Ambulatory Visit (INDEPENDENT_AMBULATORY_CARE_PROVIDER_SITE_OTHER): Payer: BC Managed Care – PPO | Admitting: *Deleted

## 2017-01-16 DIAGNOSIS — I63341 Cerebral infarction due to thrombosis of right cerebellar artery: Secondary | ICD-10-CM | POA: Diagnosis not present

## 2017-01-18 NOTE — Progress Notes (Signed)
Carelink Summary Report / Loop Recorder 

## 2017-01-21 IMAGING — MR MR HEAD W/O CM
5 series · 48 of 48 positions shown · non-contrast
Comparison: Head CT 05/17/2016 and MRI 05/10/2016

CLINICAL DATA: Cerebellar infarct last week. Sudden onset vertigo
with nausea and vomiting today. Concern for new or expanding
infarct.

EXAM:
MRI HEAD WITHOUT CONTRAST
TECHNIQUE: Multiplanar, multiecho pulse sequences of the brain and surrounding
structures were obtained without intravenous contrast.

[Series 3: DWI · axial · 3.0mm · 0.94mm/px · z∈[-76,+62]mm · 16 of 98 slices shown (1 of 2)]
[im 1/98]
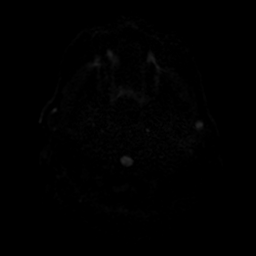
[im 7/98]
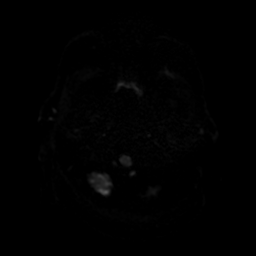
[im 13/98]
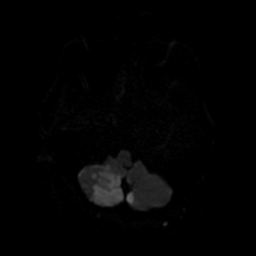
[im 20/98]
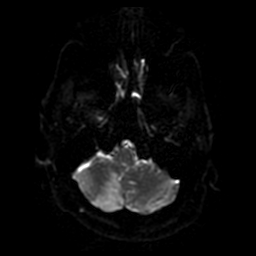
[im 26/98]
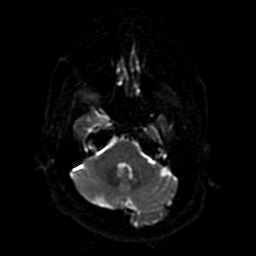
[im 33/98]
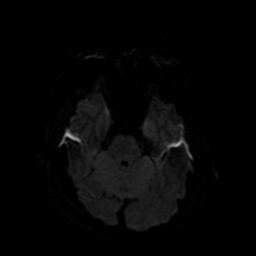
[im 39/98]
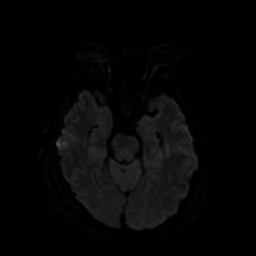
[im 46/98]
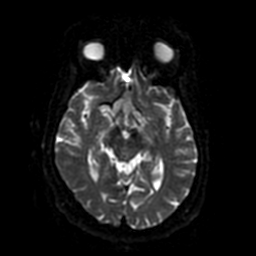
[im 52/98]
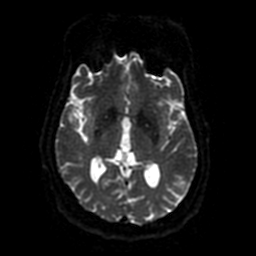
[im 59/98]
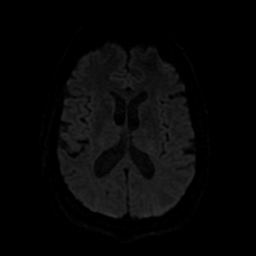
[im 65/98]
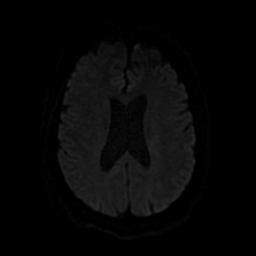
[im 72/98]
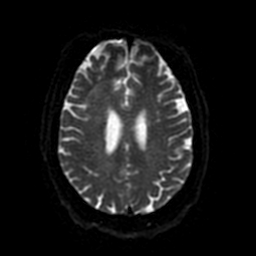
[im 78/98]
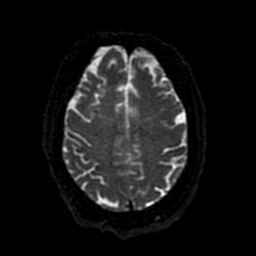
[im 85/98]
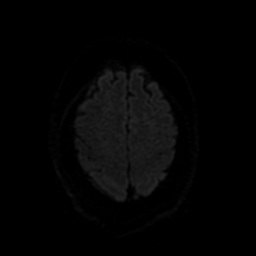
[im 91/98]
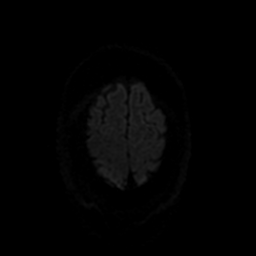
[im 98/98]
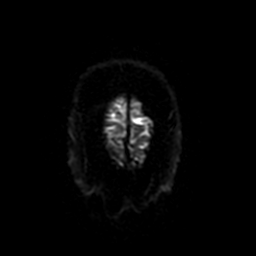

[Series 4: FLAIR · axial · 4.0mm · 0.47mm/px · z∈[-76,+61]mm · 5 of 27 slices shown]
[im 1/27]
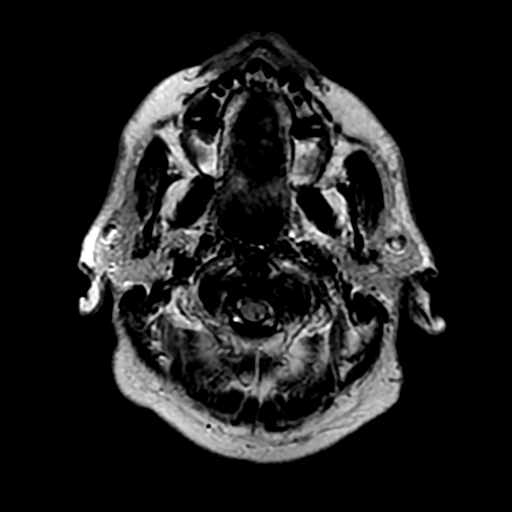
[im 7/27]
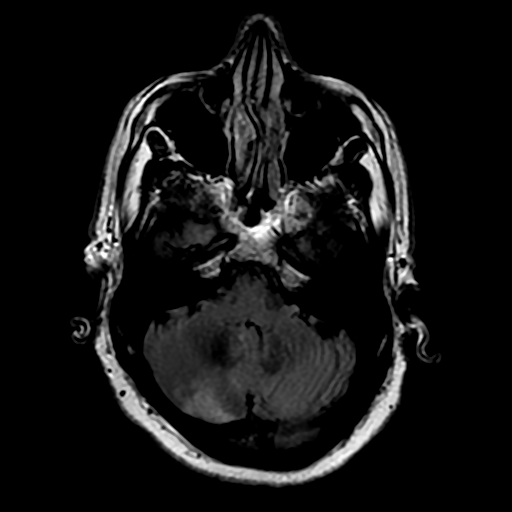
[im 14/27]
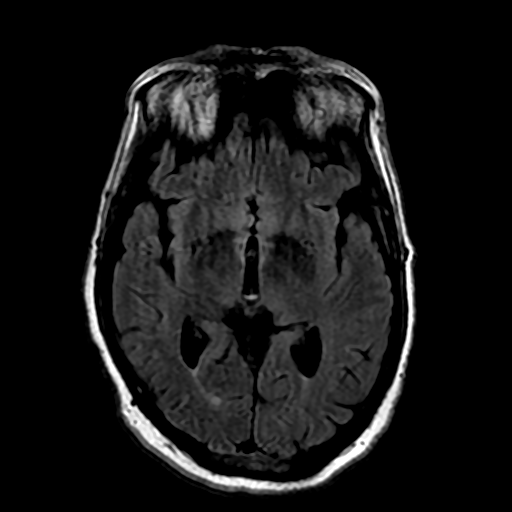
[im 20/27]
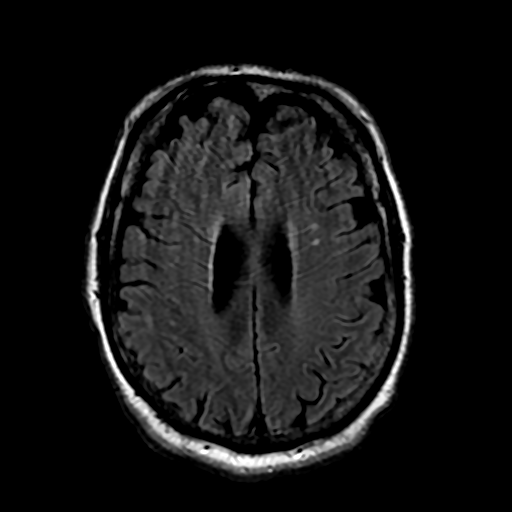
[im 27/27]
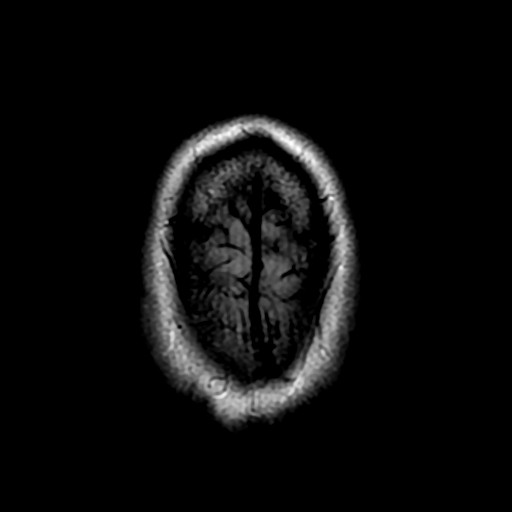

[Series 5: DWI · coronal · 4.0mm · 0.94mm/px · 12 of 66 slices shown (2 of 2)]
[im 1/66]
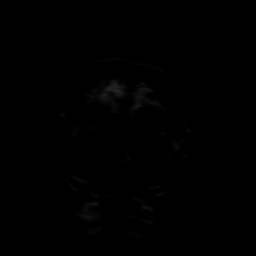
[im 6/66]
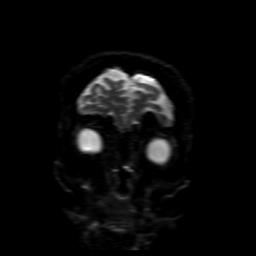
[im 12/66]
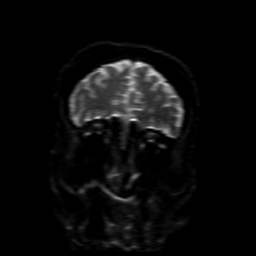
[im 18/66]
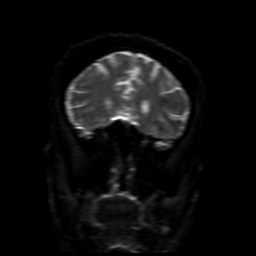
[im 24/66]
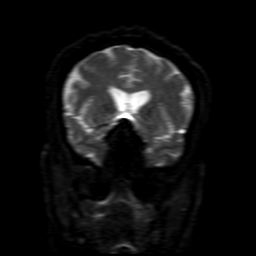
[im 30/66]
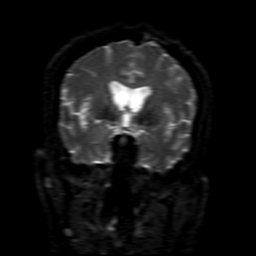
[im 36/66]
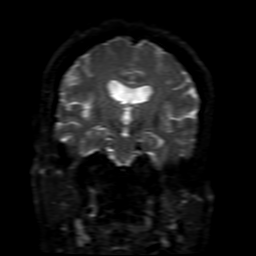
[im 42/66]
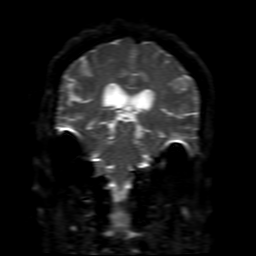
[im 48/66]
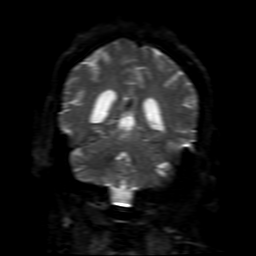
[im 54/66]
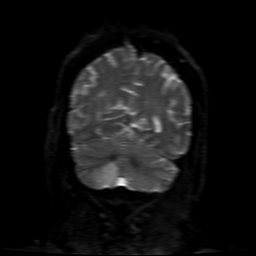
[im 60/66]
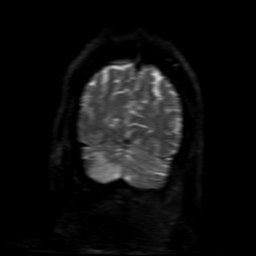
[im 66/66]
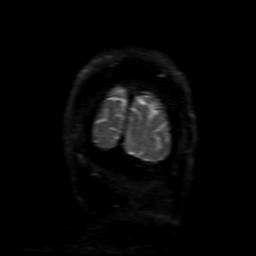

[Series 350: ADC · axial · 3.0mm · 0.94mm/px · z∈[-76,+62]mm · 9 of 49 slices shown (1 of 2)]
[im 1/49]
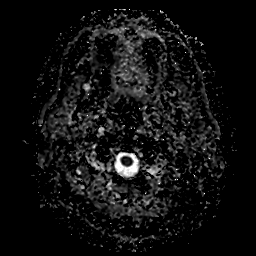
[im 7/49]
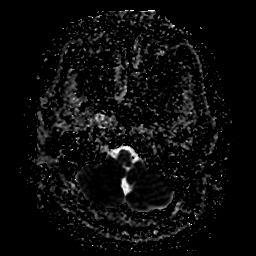
[im 13/49]
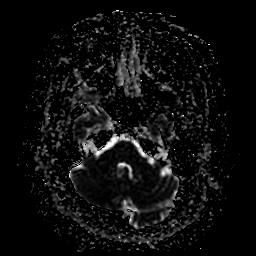
[im 19/49]
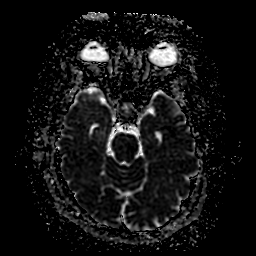
[im 25/49]
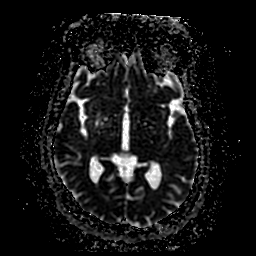
[im 31/49]
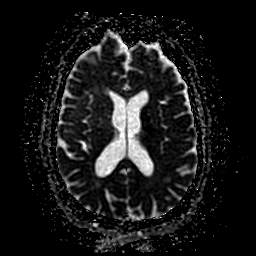
[im 37/49]
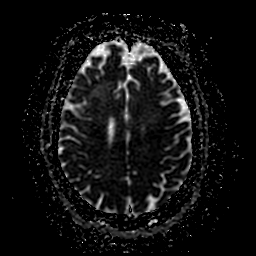
[im 43/49]
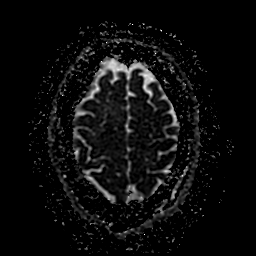
[im 49/49]
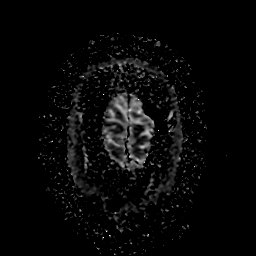

[Series 550: ADC · coronal · 4.0mm · 0.94mm/px · 6 of 33 slices shown (2 of 2)]
[im 1/33]
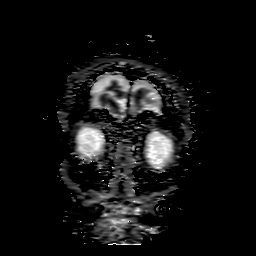
[im 7/33]
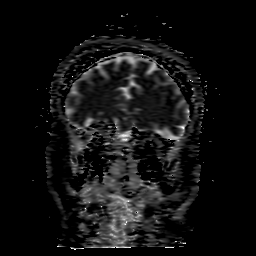
[im 13/33]
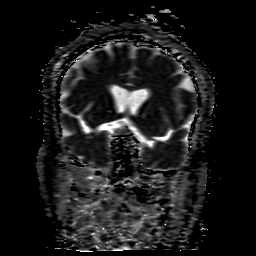
[im 20/33]
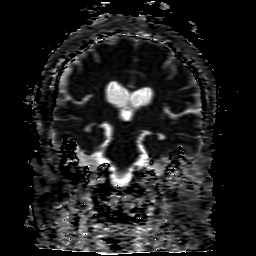
[im 26/33]
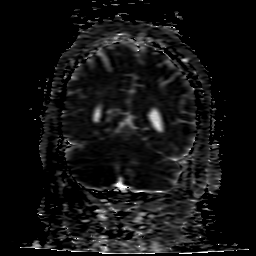
[im 33/33]
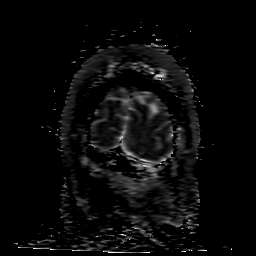

[48 of 48 positions shown; findings below may reference images not displayed]

FINDINGS: Only axial and coronal diffusion and axial FLAIR imaging was
performed at the request of the ordering neurologist.

Right larger than left PICA territory cerebellar infarcts
demonstrate expected interval evolution. ADC appears essentially
normal in these areas now with residual but mildly improved trace
diffusion signal abnormality. There is associated cytotoxic edema
with similar, mild mass effect on the fourth ventricle. There is no
evidence of hydrocephalus.

No new infarct is identified. No parenchymal hematoma is identified
on this limited study. There is no midline shift or extra-axial
fluid collection. Mild cerebral white matter disease is unchanged.
IMPRESSION: Expected evolution of now subacute cerebellar infarcts. No evidence
of infarct extension or new infarct.

## 2017-02-04 LAB — CUP PACEART REMOTE DEVICE CHECK
Date Time Interrogation Session: 20171218184014
Implantable Pulse Generator Implant Date: 20170721

## 2017-02-15 ENCOUNTER — Ambulatory Visit (INDEPENDENT_AMBULATORY_CARE_PROVIDER_SITE_OTHER): Payer: BC Managed Care – PPO | Admitting: *Deleted

## 2017-02-15 DIAGNOSIS — I63341 Cerebral infarction due to thrombosis of right cerebellar artery: Secondary | ICD-10-CM

## 2017-02-18 NOTE — Progress Notes (Signed)
Carelink Summary Report / Loop Recorder 

## 2017-02-23 LAB — CUP PACEART REMOTE DEVICE CHECK
Date Time Interrogation Session: 20180117190746
Implantable Pulse Generator Implant Date: 20170721

## 2017-03-06 LAB — CUP PACEART REMOTE DEVICE CHECK
Date Time Interrogation Session: 20180216191421
Implantable Pulse Generator Implant Date: 20170721

## 2017-03-18 ENCOUNTER — Ambulatory Visit (INDEPENDENT_AMBULATORY_CARE_PROVIDER_SITE_OTHER): Payer: BC Managed Care – PPO | Admitting: *Deleted

## 2017-03-18 DIAGNOSIS — I63341 Cerebral infarction due to thrombosis of right cerebellar artery: Secondary | ICD-10-CM

## 2017-03-18 NOTE — Progress Notes (Signed)
Carelink Summary Report / Loop Recorder 

## 2017-03-21 ENCOUNTER — Telehealth: Payer: Self-pay | Admitting: Neurology

## 2017-03-21 NOTE — Telephone Encounter (Signed)
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 State disability office wants us to fill out the disability form and release him to work in August. I told wife that I will truthfully fill out the form whatever they ask for and let the disability office decide. She expressed understanding and appreciation.   Marvel PlanJindong Graclyn Lawther, MD PhD Stroke Neurology 03/21/2017 2:02 PM

## 2017-03-21 NOTE — Telephone Encounter (Signed)
Patients wife [name] (listed on DPR) called office in reference to Short Term Disability paperwork.  [name] would like to speak with nurse in reference to Dr. Roda ShuttersXU filling out the forms prior to dropping them off.  Please call

## 2017-03-21 NOTE — Telephone Encounter (Signed)
Rn call patients wife back about short term disability forms. PTs wife stated when her husband had the stroke last year, he submitted DMV forms to drive. DMV stated pt cannot drive for 12 months. . PTs wife Darl PikesSusan also stated Dr. Claudette LawsAndrew Kirsteins [facility] had done forms for past last year taking him out of work. Pt had therapy last year. Pts wife stated the disability company wants MD to put patient out of work for a year to get the disability Pts wife also stated the disability company stated pt has to be seen within the last 6 months by a dr. Pt was seen by Dr. Roda ShuttersXu in 10/2016 for stroke follow up. Rn stated  Per Dr. Roda ShuttersXu pt has done well.Rn stated Dr. Roda ShuttersXu is out of the office on Thursday and Friday. Rn stated a message will be sent to Dr. Roda ShuttersXu to discuss this issue.

## 2017-03-21 NOTE — Telephone Encounter (Signed)
Rn notified Dr. Roda ShuttersXu about phone note. Note sent to Dr. Roda ShuttersXu to review. Dr. Roda ShuttersXu out of office Thursday and Friday.

## 2017-03-25 NOTE — Telephone Encounter (Signed)
Disability forms put on Dr. Roda ShuttersXu desk for evaluation today by Rn.

## 2017-03-26 NOTE — Telephone Encounter (Signed)
Rn call [name] about the forms. Rn stated Dr. Roda ShuttersXu was notified this morning that the forms ready to be filled out. RN stated on both forms a black marker on all the pages where the address is for the disability form. Pts wife [name] stated when she was getting to pick up the forms that was done by the staff person at the office already. Rn stated Dr. Roda ShuttersXu is aware the form is due 04/09/2017.

## 2017-03-29 NOTE — Telephone Encounter (Signed)
Rn call patients wife [name] about disability form for her husband. Rn stated both forms are filled out and sign by Dr.Xu. Rn stated payment of 50.00 is needed for the form, and her husband needs to sign a release form and filled if out. The release form needs to be submitted to medical records. Rn stated Dr. Roda Shutters will not be in the office next week, because he is on vacation. Rn stated both forms will be in the brown envelope that she provided. [name] verbalized understanding. Dr. Roda Shutters put pt out of work till 07/2017. 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

## 2017-04-01 LAB — CUP PACEART REMOTE DEVICE CHECK
Date Time Interrogation Session: 20180318194221
Implantable Pulse Generator Implant Date: 20170721

## 2017-04-02 DIAGNOSIS — Z0289 Encounter for other administrative examinations: Secondary | ICD-10-CM

## 2017-04-02 NOTE — Telephone Encounter (Signed)
Pt's wife called back. RN skyped but was not available. Wife said 03/31/17 signature needs to be: form 703, page 2, section G-beside physician signature- 03/31/17 and form 7A, page 3, sectionD beside physician signature- 03/31/17. Wife said this is for April submission and needs to read April 1 not March. Thank you

## 2017-04-02 NOTE — Telephone Encounter (Signed)
Left wife vm on cell phone.

## 2017-04-02 NOTE — Telephone Encounter (Signed)
Rn call wife on home number. Rn stated Dr. Roda Shutters was on vacation starting 03/31/2017. Rn stated the form is dated 03/28/2017 because that's when he completed the form.  Rn stated a letter can be written by the nurse to explain the reason. Rn stated the letter will state Dr. Roda Shutters has approve pt to be out of work from 03/31/2017 to 07/31/2017 so he can receive disability. Rn stated the letter and forms will be at the front desk for pick up. Pts wife verbalized understanding.

## 2017-04-02 NOTE — Telephone Encounter (Signed)
:  Left vm for patients wife that date where does 03/31/2017 need to documented. Rn left vm that Dr. Roda Shutters sign and did the form on 03/28/2017. Rn explain that Dr. Roda Shutters is not in the office this week he is on vacation. Form cannot be change till 04/08/2017. LEft vm to call back.

## 2017-04-16 ENCOUNTER — Ambulatory Visit (INDEPENDENT_AMBULATORY_CARE_PROVIDER_SITE_OTHER): Payer: BC Managed Care – PPO | Admitting: *Deleted

## 2017-04-16 DIAGNOSIS — I63341 Cerebral infarction due to thrombosis of right cerebellar artery: Secondary | ICD-10-CM | POA: Diagnosis not present

## 2017-04-17 NOTE — Progress Notes (Signed)
Carelink Summary Report / Loop Recorder 

## 2017-05-01 LAB — CUP PACEART REMOTE DEVICE CHECK
Date Time Interrogation Session: 20180417194000
Implantable Pulse Generator Implant Date: 20170721

## 2017-05-02 ENCOUNTER — Telehealth: Payer: Self-pay | Admitting: Neurology

## 2017-05-02 NOTE — Telephone Encounter (Signed)
Pt's wife called reg disability paperwork. Please call

## 2017-05-02 NOTE — Telephone Encounter (Signed)
Rn call patients wife [name] about the same paperwork needs to be sign for May 1,2018. The same four questions need to be answer, dated, sign by Dr. Roda ShuttersXu. Rn stated to [name] on dpr that Dr. Roda ShuttersXu is off today and tomorrow. 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 [name] stated she will bring the paper work by Friday. Rn stated Dr .Roda ShuttersXu will be notified to come by the office to sign the paperwork. [name] verbalized understanding.

## 2017-05-07 NOTE — Telephone Encounter (Signed)
Receive disability forms from pts wife. Form has to be date and filled out every month till August 2018. Form put on Dr. Roda ShuttersXu desk. Pt already paid fee in April of 50.00 for form.

## 2017-05-08 NOTE — Telephone Encounter (Signed)
Forms will not be ready  Until tomorrow. Dr. Roda ShuttersXu will be coming by office this evening to fill forms out.

## 2017-05-08 NOTE — Telephone Encounter (Signed)
Pt called checking on status of the forms. She is wanting to pick it up today if possible or asap. Please call at [phone]

## 2017-05-08 NOTE — Telephone Encounter (Signed)
Pt's wife called back, msg relayed. Said [facility] 4[facility] tomorrow,she would like to pick them up in the morning.   FYI

## 2017-05-09 NOTE — Telephone Encounter (Signed)
Disability forms left at front desk with envelope.Wife can pick up.

## 2017-05-09 NOTE — Telephone Encounter (Signed)
Pt's wife called back, msg relayed

## 2017-05-14 ENCOUNTER — Ambulatory Visit (INDEPENDENT_AMBULATORY_CARE_PROVIDER_SITE_OTHER): Payer: BC Managed Care – PPO | Admitting: Neurology

## 2017-05-14 ENCOUNTER — Encounter: Payer: Self-pay | Admitting: Neurology

## 2017-05-14 VITALS — BP 100/58 | HR 65 | Wt 190.1 lb

## 2017-05-14 DIAGNOSIS — I639 Cerebral infarction, unspecified: Secondary | ICD-10-CM | POA: Diagnosis not present

## 2017-05-14 DIAGNOSIS — E785 Hyperlipidemia, unspecified: Secondary | ICD-10-CM | POA: Diagnosis not present

## 2017-05-14 NOTE — Patient Instructions (Signed)
-   continue ASA and lipitor for stroke prevention - follow up with loop recorder - Follow up with your primary care physician for stroke risk factor modification. Recommend maintain blood pressure goal <130/80, diabetes with hemoglobin A1c goal below 7.0% and lipids with LDL cholesterol goal below 70 mg/dL.  - check BP at home  - No restriction for driving from stroke standpoint. 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  - healthy diet and regular exercise - follow up as needed.

## 2017-05-14 NOTE — Progress Notes (Signed)
STROKE NEUROLOGY FOLLOW UP NOTE  NAME: [name] DOB: 08/14/1951  REASON FOR VISIT: stroke follow up HISTORY FROM: pt and wife and chart  Today we had the pleasure of seeing [name] in follow-up at our Neurology Clinic. Pt was accompanied by wife.   History Summary [name] is a 66 y.o. male with no significant past medical history was admitted on 05/08/16 for sudden onset vertigo and nausea. MRI showed bilateral PICA infarcts, R>L. And CTA head and neck showed possible right PICA occlusion vs. High grade stenosis, otherwise, extracranial and intracranial vessels are free of atherosclerosis. Other stroke work up including TTE, LE venous doppler, A1C all negative. Except LDL 117. His symptoms improved and d/c to CIR on ASA and lipitor and recommendation to do 30 day cardiac monitoring as outpt. During CIR admission, he had one episode of recurrent vertigo, MRI negative, and symptoms recovered quick. It considered as recrudescence of last stroke. He was then d/c from CIR to home in good condition.  07/09/16 follow up - the patient has been doing well. No recurrent symptoms. He all feels well without complains. He has not back to drive or work yet. He followed with Dr. Eldridge DaceVaranasi once in clinic and reviewed 30 day cardiac event monitoring and did not see afib. However, as per pt, the external monitoring was not a good experience due to constant beeping of the machine. BP 100/68.   11/12/16 follow up - pt has been doing well. Had loop recorder placed and so far no afib. 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 So far he is not working and taking his sick leave days. He is working on his disability from the state. BP today 110/76. No other complains.  Interval History During the interval time, pt has been doing well. No recurrent stroke like symptoms. Loop recorder no afib. Still feels easily tired after activities. 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 BP 100/78.  REVIEW OF SYSTEMS: Full 14 system review of systems performed and notable only for those listed below and in HPI above, all others are negative:  Constitutional:  Cardiovascular:  Ear/Nose/Throat:   Skin:  Eyes:   Respiratory:   Gastroitestinal:   Genitourinary:  Hematology/Lymphatic:   Endocrine:  Musculoskeletal:   Allergy/Immunology:   Neurological:   Psychiatric:  Sleep:   The following represents the patient's updated allergies and side effects list: No Known Allergies  The neurologically relevant items on the patient's problem list were reviewed on today's visit.  Neurologic Examination  A problem focused neurological exam (12 or more points of the single system neurologic examination, vital signs counts as 1 point, cranial nerves count for 8 points) was performed.  Blood pressure (!) 100/58, pulse 65, weight 190 lb 1.6 oz (86.2 kg).  General - Well nourished, well developed, in no apparent distress.  Ophthalmologic - Sharp disc margins OU. Fundi not visualized due to .  Cardiovascular - Regular rate and rhythm with no murmur.  Mental Status -  Level of arousal and orientation to time, place, and person were intact. Language including expression, naming, repetition, comprehension was assessed and found intact. Fund of Knowledge was assessed and was intact.  Cranial Nerves II - XII - II - Visual field intact OU. III, IV, VI - Extraocular movements intact. V - Facial sensation intact bilaterally. VII - Facial movement intact bilaterally. VIII - Hearing &  vestibular intact bilaterally. X - Palate elevates symmetrically. XI - Chin turning & shoulder shrug intact bilaterally. XII - Tongue protrusion intact.  Motor Strength - The patient's strength was normal in all extremities and pronator drift was absent.  Bulk was normal and fasciculations were absent.   Motor Tone - Muscle tone was assessed at the neck  and appendages and was normal.  Reflexes - The patient's reflexes were 1+ in all extremities and he had no pathological reflexes.  Sensory - Light touch, temperature/pinprick, vibration and proprioception, and Romberg testing were assessed and were normal.    Coordination - The patient had normal movements in the hands and feet with no ataxia or dysmetria.  Tremor was absent.  Gait and Station - The patient's transfers, posture, gait, station, and turns were observed as normal.   Data reviewed: I personally reviewed the images and agree with the radiology interpretations.  Ct Head Wo Contrast 05/08/2016 Mildly limited examination demonstrating mild atrophy with no acute abnormality.   05/17/16 1. Expected evolution of subacute infarction in the RIGHT cerebellum. 2. No evidence of new infarction or hemorrhage. 3. No new mass effect.  Mr Brain Wo Contrast 05/10/2016 Large acute RIGHT cerebellar infarct, nonhemorrhagic, PICA territory. Much smaller infarct of the LEFT medial inferior cerebellum, also PICA territory. Mild mass effect on the fourth ventricle. The patient should be observed for signs of developing obstructive hydrocephalus. A call is in to the care team.   05/17/16 - Expected evolution of now subacute cerebellar infarcts. No evidence of infarct extension or new infarct.  CTA head ane neck  05/10/16- Acute PICA infarct right greater than left. There appears to be decreased flow in the right PICA which may be related to the acute infarct and partial occlusion. No filling defect or thrombus identified. No vertebral artery stenosis or dissection is identified as a cause of the infarct. No significant carotid or vertebral artery stenosis in the neck. Negative for dissection.  TTE - - Left ventricle: The cavity size was normal. Wall thickness was  increased increased in a pattern of mild to moderate LVH.  Systolic function was normal. The estimated ejection fraction was   in the range of 55% to 60%. Wall motion was normal; there were no  regional wall motion abnormalities. Doppler parameters are  consistent with abnormal left ventricular relaxation (grade 1  diastolic dysfunction). - Mitral valve: There was mild regurgitation. - Left atrium: The atrium was mildly dilated. Impressions: - No cardiac source of emboli was indentified.  LE venous doppler - Bilateral: No evidence of DVT, superficial thrombosis, or Baker's Cyst.  Component     Latest Ref Rng 05/09/2016  Cholesterol     0 - 200 mg/dL 454  Triglycerides     <150 mg/dL 34  HDL Cholesterol     >40 mg/dL 41  Total CHOL/HDL Ratio      4.0  VLDL     0 - 40 mg/dL 7  LDL (calc)     0 - 99 mg/dL 098 (H)  Hemoglobin J1B     4.8 - 5.6 % 5.3  Mean Plasma Glucose      105    Assessment: As you may recall, he is a 66 y.o. Caucasian male with no PMH admitted on 05/08/16 for bilateral cerebellar infarcts, R>L, consistent with right PICA territory. CTA head and neck showed possible right PICA occlusion vs. High grade stenosis, otherwise, extracranial and intracranial vessels are free of atherosclerosis. Other stroke work up including  TTE, LE venous doppler, A1C all negative, except LDL 117. He was put on ASA and lipitor and recommendation to do 30 day cardiac monitoring on discharge. During CIR admission, he had one episode of recurrent vertigo, MRI and CT negative, and symptoms recovered quick. It was considered as recrudescence of the stroke. During the interval time, the patient has been doing well. No recurrent symptoms. 30 day cardiac monitoring negative for afib. Pt stroke etiology still not very clear. All work up essentially normal except LDL 117. Had loop recorder placed and no afib so far. 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   Plan:  - continue ASA and lipitor for stroke prevention - follow up with loop recorder - Follow up with your primary  care physician for stroke risk factor modification. Recommend maintain blood pressure goal <130/80, diabetes with hemoglobin A1c goal below 7.0% and lipids with LDL cholesterol goal below 70 mg/dL.  - check BP at home  - No restriction for driving from stroke standpoint.  - healthy diet and regular exercise - follow up as needed.    No orders of the defined types were placed in this encounter.   No orders of the defined types were placed in this encounter.   Patient Instructions  - continue ASA and lipitor for stroke prevention - follow up with loop recorder - Follow up with your primary care physician for stroke risk factor modification. Recommend maintain blood pressure goal <130/80, diabetes with hemoglobin A1c goal below 7.0% and lipids with LDL cholesterol goal below 70 mg/dL.  - check BP at home  - No restriction for driving from stroke standpoint. 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  - healthy diet and regular exercise - follow up as needed.     Marvel Plan, MD PhD [facility] [address], [address] [place], [place] [address] [phone]

## 2017-05-16 ENCOUNTER — Ambulatory Visit (INDEPENDENT_AMBULATORY_CARE_PROVIDER_SITE_OTHER): Payer: BC Managed Care – PPO | Admitting: *Deleted

## 2017-05-16 DIAGNOSIS — I63341 Cerebral infarction due to thrombosis of right cerebellar artery: Secondary | ICD-10-CM | POA: Diagnosis not present

## 2017-05-17 NOTE — Progress Notes (Signed)
Carelink Summary Report / Loop Recorder 

## 2017-05-27 LAB — CUP PACEART REMOTE DEVICE CHECK
Date Time Interrogation Session: 20180517200733
Implantable Pulse Generator Implant Date: 20170721

## 2017-05-30 ENCOUNTER — Telehealth: Payer: Self-pay

## 2017-05-30 ENCOUNTER — Telehealth: Payer: Self-pay | Admitting: *Deleted

## 2017-05-30 NOTE — Telephone Encounter (Signed)
Pt total retirement form @ front desk for pickup

## 2017-05-30 NOTE — Telephone Encounter (Signed)
Form filled out again for month of June 2018. Pt has to do form every month till 07/31/2017 until he returns to work. Form done for June sign by Dr. Roda ShuttersXu left at front desk for pick up.

## 2017-06-17 ENCOUNTER — Ambulatory Visit (INDEPENDENT_AMBULATORY_CARE_PROVIDER_SITE_OTHER): Payer: BC Managed Care – PPO | Admitting: *Deleted

## 2017-06-17 DIAGNOSIS — I63341 Cerebral infarction due to thrombosis of right cerebellar artery: Secondary | ICD-10-CM | POA: Diagnosis not present

## 2017-06-17 NOTE — Progress Notes (Signed)
Carelink Summary Report / Loop Recorder 

## 2017-06-25 LAB — CUP PACEART REMOTE DEVICE CHECK
Date Time Interrogation Session: 20180616204719
Implantable Pulse Generator Implant Date: 20170721

## 2017-06-27 ENCOUNTER — Telehealth: Payer: Self-pay

## 2017-06-27 NOTE — Telephone Encounter (Signed)
Pt paid in April 2018 for monthly forms for disability. 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 Forms for July 2018 done. The forms are fill out updating his out of work status.. Forms left at front desk for pick up. No fee involve already paid.

## 2017-06-27 NOTE — Telephone Encounter (Signed)
See note

## 2017-06-27 NOTE — Telephone Encounter (Signed)
Forms were done for the month of June 2018 and given to patient.

## 2017-07-15 ENCOUNTER — Ambulatory Visit (INDEPENDENT_AMBULATORY_CARE_PROVIDER_SITE_OTHER): Payer: BC Managed Care – PPO | Admitting: *Deleted

## 2017-07-15 DIAGNOSIS — I63341 Cerebral infarction due to thrombosis of right cerebellar artery: Secondary | ICD-10-CM

## 2017-07-16 NOTE — Progress Notes (Signed)
Carelink Summary Report / Loop Recorder 

## 2017-07-25 LAB — CUP PACEART REMOTE DEVICE CHECK
Date Time Interrogation Session: 20180716213924
Implantable Pulse Generator Implant Date: 20170721

## 2017-07-25 NOTE — Progress Notes (Signed)
[facility] summary report received. Battery status OK. Normal device function. No new symptom episodes, tachy episodes, brady, or pause episodes. No new AF episodes. Monthly summary reports and ROV/PRN 

## 2017-08-02 NOTE — Progress Notes (Signed)
Disability form left at front desk.

## 2017-08-14 ENCOUNTER — Ambulatory Visit (INDEPENDENT_AMBULATORY_CARE_PROVIDER_SITE_OTHER): Payer: BC Managed Care – PPO | Admitting: *Deleted

## 2017-08-14 DIAGNOSIS — I639 Cerebral infarction, unspecified: Secondary | ICD-10-CM | POA: Diagnosis not present

## 2017-08-22 LAB — CUP PACEART REMOTE DEVICE CHECK
Date Time Interrogation Session: 20180815221420
Implantable Pulse Generator Implant Date: 20170721

## 2017-08-22 NOTE — Progress Notes (Signed)
Loop recorder summary report 

## 2017-09-13 ENCOUNTER — Ambulatory Visit (INDEPENDENT_AMBULATORY_CARE_PROVIDER_SITE_OTHER): Payer: Self-pay | Admitting: *Deleted

## 2017-09-13 DIAGNOSIS — I639 Cerebral infarction, unspecified: Secondary | ICD-10-CM

## 2017-09-16 NOTE — Progress Notes (Signed)
Carelink Summary Report / Loop Recorder 

## 2017-09-17 LAB — CUP PACEART REMOTE DEVICE CHECK
Date Time Interrogation Session: 20180914221316
Implantable Pulse Generator Implant Date: 20170721

## 2017-10-03 ENCOUNTER — Telehealth: Payer: Self-pay | Admitting: Internal Medicine

## 2017-10-03 NOTE — Telephone Encounter (Signed)
Spoke with Ms. Montez Morita and informed her that Mr.Pott has not had any episodes recorded. Informed Ms. Gallick that the home monitor will not alert them if it does record an episode that it will only send the alert. Ms. Fines voiced understanding

## 2017-10-03 NOTE — Telephone Encounter (Signed)
New Message  Pt wife would like to ask questions about pts loop recorder. please call back to discuss

## 2017-10-09 DIAGNOSIS — E782 Mixed hyperlipidemia: Secondary | ICD-10-CM | POA: Diagnosis not present

## 2017-10-09 DIAGNOSIS — R972 Elevated prostate specific antigen [PSA]: Secondary | ICD-10-CM | POA: Diagnosis not present

## 2017-10-14 ENCOUNTER — Ambulatory Visit (INDEPENDENT_AMBULATORY_CARE_PROVIDER_SITE_OTHER): Payer: Medicare Other | Admitting: *Deleted

## 2017-10-14 DIAGNOSIS — I639 Cerebral infarction, unspecified: Secondary | ICD-10-CM | POA: Diagnosis not present

## 2017-10-14 NOTE — Progress Notes (Signed)
Carelink Summary Report / Loop Recorder 

## 2017-10-16 ENCOUNTER — Other Ambulatory Visit: Payer: Self-pay | Admitting: Internal Medicine

## 2017-10-16 DIAGNOSIS — R0602 Shortness of breath: Secondary | ICD-10-CM | POA: Diagnosis not present

## 2017-10-16 DIAGNOSIS — E782 Mixed hyperlipidemia: Secondary | ICD-10-CM | POA: Diagnosis not present

## 2017-10-16 DIAGNOSIS — R002 Palpitations: Secondary | ICD-10-CM

## 2017-10-16 DIAGNOSIS — H1032 Unspecified acute conjunctivitis, left eye: Secondary | ICD-10-CM | POA: Diagnosis not present

## 2017-10-16 DIAGNOSIS — I63433 Cerebral infarction due to embolism of bilateral posterior cerebral arteries: Secondary | ICD-10-CM | POA: Diagnosis not present

## 2017-10-16 DIAGNOSIS — Z23 Encounter for immunization: Secondary | ICD-10-CM | POA: Diagnosis not present

## 2017-10-16 DIAGNOSIS — R972 Elevated prostate specific antigen [PSA]: Secondary | ICD-10-CM | POA: Diagnosis not present

## 2017-10-16 LAB — CUP PACEART REMOTE DEVICE CHECK
Date Time Interrogation Session: 20181014223837
Implantable Pulse Generator Implant Date: 20170721

## 2017-10-23 ENCOUNTER — Ambulatory Visit (INDEPENDENT_AMBULATORY_CARE_PROVIDER_SITE_OTHER): Payer: Medicare Other

## 2017-10-23 DIAGNOSIS — R002 Palpitations: Secondary | ICD-10-CM | POA: Diagnosis not present

## 2017-10-23 DIAGNOSIS — R0602 Shortness of breath: Secondary | ICD-10-CM | POA: Diagnosis not present

## 2017-10-23 DIAGNOSIS — I63433 Cerebral infarction due to embolism of bilateral posterior cerebral arteries: Secondary | ICD-10-CM | POA: Diagnosis not present

## 2017-10-23 LAB — EXERCISE TOLERANCE TEST
Estimated workload: 7 METS
Exercise duration (min): 5 min
Exercise duration (sec): 0 s
MPHR: 153 {beats}/min
Peak HR: 142 {beats}/min
Percent HR: 92 %
RPE: 16
Rest HR: 67 {beats}/min

## 2017-10-24 DIAGNOSIS — R002 Palpitations: Secondary | ICD-10-CM | POA: Diagnosis not present

## 2017-10-24 DIAGNOSIS — R0602 Shortness of breath: Secondary | ICD-10-CM | POA: Diagnosis not present

## 2017-10-29 ENCOUNTER — Telehealth: Payer: Self-pay | Admitting: Interventional Cardiology

## 2017-10-29 NOTE — Telephone Encounter (Signed)
Returned call to ShuqualakNancy at Dr. Carolyn Stareamachandran's office. Left message with the operator for her to call back.

## 2017-10-29 NOTE — Telephone Encounter (Signed)
New Message  Harriett SineNancy from Dr. Nicholos Johnsamachandran office call requesting to speak with RN about pts results from ETT. She states there is no conclusion to the results. Please call back to discuss

## 2017-10-29 NOTE — Telephone Encounter (Signed)
[name] calling back from Dr. Carolyn Stareamachandran's office. She is wanting to Dr. Eldridge DaceVaranasi to look at ETT from 10/24 and let them know if it was a negative stress test. Made her aware that the test was normal; there were no ST changes, no chest pain, and normal BP response to exercise.

## 2017-11-12 ENCOUNTER — Ambulatory Visit (INDEPENDENT_AMBULATORY_CARE_PROVIDER_SITE_OTHER): Payer: Medicare Other | Admitting: *Deleted

## 2017-11-12 DIAGNOSIS — I639 Cerebral infarction, unspecified: Secondary | ICD-10-CM

## 2017-11-13 NOTE — Progress Notes (Signed)
Carelink Summary Report / Loop Recorder 

## 2017-11-27 DIAGNOSIS — R972 Elevated prostate specific antigen [PSA]: Secondary | ICD-10-CM | POA: Diagnosis not present

## 2017-11-27 DIAGNOSIS — E782 Mixed hyperlipidemia: Secondary | ICD-10-CM | POA: Diagnosis not present

## 2017-11-27 DIAGNOSIS — R42 Dizziness and giddiness: Secondary | ICD-10-CM | POA: Diagnosis not present

## 2017-11-27 DIAGNOSIS — I63433 Cerebral infarction due to embolism of bilateral posterior cerebral arteries: Secondary | ICD-10-CM | POA: Diagnosis not present

## 2017-11-29 LAB — CUP PACEART REMOTE DEVICE CHECK
Date Time Interrogation Session: 20181113230743
Implantable Pulse Generator Implant Date: 20170721

## 2017-12-12 ENCOUNTER — Ambulatory Visit (INDEPENDENT_AMBULATORY_CARE_PROVIDER_SITE_OTHER): Payer: Medicare Other | Admitting: *Deleted

## 2017-12-12 DIAGNOSIS — I639 Cerebral infarction, unspecified: Secondary | ICD-10-CM | POA: Diagnosis not present

## 2017-12-13 NOTE — Progress Notes (Signed)
Carelink Summary Report / Loop Recorder 

## 2017-12-23 LAB — CUP PACEART REMOTE DEVICE CHECK
Date Time Interrogation Session: 20181213233708
Implantable Pulse Generator Implant Date: 20170721

## 2018-01-13 ENCOUNTER — Ambulatory Visit (INDEPENDENT_AMBULATORY_CARE_PROVIDER_SITE_OTHER): Payer: Medicare Other | Admitting: *Deleted

## 2018-01-13 DIAGNOSIS — I639 Cerebral infarction, unspecified: Secondary | ICD-10-CM | POA: Diagnosis not present

## 2018-01-14 NOTE — Progress Notes (Signed)
Carelink Summary Report / Loop Recorder 

## 2018-01-24 LAB — CUP PACEART REMOTE DEVICE CHECK
Date Time Interrogation Session: 20190113003736
Implantable Pulse Generator Implant Date: 20170721

## 2018-02-10 ENCOUNTER — Ambulatory Visit (INDEPENDENT_AMBULATORY_CARE_PROVIDER_SITE_OTHER): Payer: Medicare Other | Admitting: *Deleted

## 2018-02-10 DIAGNOSIS — I639 Cerebral infarction, unspecified: Secondary | ICD-10-CM

## 2018-02-11 NOTE — Progress Notes (Signed)
Carelink Summary Report / Loop Recorder 

## 2018-03-11 LAB — CUP PACEART REMOTE DEVICE CHECK
Date Time Interrogation Session: 20190212021546
Implantable Pulse Generator Implant Date: 20170721

## 2018-03-17 ENCOUNTER — Ambulatory Visit (INDEPENDENT_AMBULATORY_CARE_PROVIDER_SITE_OTHER): Payer: Medicare Other | Admitting: *Deleted

## 2018-03-17 DIAGNOSIS — I639 Cerebral infarction, unspecified: Secondary | ICD-10-CM | POA: Diagnosis not present

## 2018-03-17 NOTE — Progress Notes (Signed)
Carelink Summary Report / Loop Recorder 

## 2018-03-18 ENCOUNTER — Telehealth: Payer: Self-pay

## 2018-03-18 NOTE — Telephone Encounter (Signed)
Spoke with pt; he understands the decrease to 81mg  ASA, or "baby aspirin," per day. He had no additional questions.

## 2018-03-18 NOTE — Telephone Encounter (Signed)
-----   Message from Duke SalviaSteven C Klein, MD sent at 03/17/2018  2:43 PM EDT ----- Can u followup with pt about this thnks  ----- Message ----- From: Marvel PlanXu, Jindong, MD Sent: 03/15/2018  12:01 AM To: Duke SalviaSteven C Klein, MD  Dr. Graciela HusbandsKlein, I think ASA 81 should be OK from my standpoint. Thanks. - Jindong  ----- Message ----- From: Duke SalviaKlein, Steven C, MD Sent: 03/14/2018   3:19 PM To: Marvel PlanJindong Xu, MD  Professor  This pt is on ASA 325  Is that necessary or should we reduce the dose

## 2018-03-25 ENCOUNTER — Telehealth: Payer: Self-pay | Admitting: Cardiology

## 2018-03-25 NOTE — Telephone Encounter (Signed)
LMOVM requesting that pt send manual transmission b/c home monitor has not updated in at least 14 days.    

## 2018-04-04 ENCOUNTER — Other Ambulatory Visit: Payer: Self-pay | Admitting: Internal Medicine

## 2018-04-17 ENCOUNTER — Ambulatory Visit (INDEPENDENT_AMBULATORY_CARE_PROVIDER_SITE_OTHER): Payer: Medicare Other | Admitting: *Deleted

## 2018-04-17 DIAGNOSIS — I639 Cerebral infarction, unspecified: Secondary | ICD-10-CM

## 2018-04-18 NOTE — Progress Notes (Signed)
Carelink Summary Report / Loop Recorder 

## 2018-04-22 LAB — CUP PACEART REMOTE DEVICE CHECK
Date Time Interrogation Session: 20190317013937
Implantable Pulse Generator Implant Date: 20170721

## 2018-05-16 LAB — CUP PACEART REMOTE DEVICE CHECK
Date Time Interrogation Session: 20190419013641
Implantable Pulse Generator Implant Date: 20170721

## 2018-05-20 ENCOUNTER — Ambulatory Visit (INDEPENDENT_AMBULATORY_CARE_PROVIDER_SITE_OTHER): Payer: Medicare Other | Admitting: *Deleted

## 2018-05-20 DIAGNOSIS — I639 Cerebral infarction, unspecified: Secondary | ICD-10-CM | POA: Diagnosis not present

## 2018-05-20 NOTE — Progress Notes (Signed)
Carelink Summary Report / Loop Recorder 

## 2018-06-16 LAB — CUP PACEART REMOTE DEVICE CHECK
Date Time Interrogation Session: 20190522020933
Implantable Pulse Generator Implant Date: 20170721

## 2018-06-23 ENCOUNTER — Ambulatory Visit (INDEPENDENT_AMBULATORY_CARE_PROVIDER_SITE_OTHER): Payer: Medicare Other | Admitting: *Deleted

## 2018-06-23 DIAGNOSIS — I639 Cerebral infarction, unspecified: Secondary | ICD-10-CM

## 2018-06-23 NOTE — Progress Notes (Signed)
Carelink Summary Report / Loop Recorder 

## 2018-07-25 ENCOUNTER — Ambulatory Visit (INDEPENDENT_AMBULATORY_CARE_PROVIDER_SITE_OTHER): Payer: Medicare Other | Admitting: *Deleted

## 2018-07-25 DIAGNOSIS — I639 Cerebral infarction, unspecified: Secondary | ICD-10-CM | POA: Diagnosis not present

## 2018-07-28 NOTE — Progress Notes (Signed)
Carelink Summary Report / Loop Recorder 

## 2018-07-29 LAB — CUP PACEART REMOTE DEVICE CHECK
Date Time Interrogation Session: 20190624023854
Implantable Pulse Generator Implant Date: 20170721

## 2018-08-20 ENCOUNTER — Telehealth: Payer: Self-pay | Admitting: *Deleted

## 2018-08-20 NOTE — Telephone Encounter (Signed)
LVMOM regarding sending manual transmission to review 18 tachy episodes. Gave [facility] phone number to call back with questions or concerns.

## 2018-08-26 NOTE — Telephone Encounter (Signed)
LMOM requesting manual [employer] transmission for review. Gave transmission instructions and direct DC phone number for questions/concerns.

## 2018-08-27 ENCOUNTER — Ambulatory Visit (INDEPENDENT_AMBULATORY_CARE_PROVIDER_SITE_OTHER): Payer: Medicare Other | Admitting: *Deleted

## 2018-08-27 DIAGNOSIS — I639 Cerebral infarction, unspecified: Secondary | ICD-10-CM

## 2018-08-28 NOTE — Progress Notes (Signed)
Carelink Summary Report / Loop Recorder 

## 2018-09-02 NOTE — Telephone Encounter (Signed)
Full report received on 08/27/18. ECGs printed for review by Dr. Graciela Husbands. Dr. Graciela Husbands reviewed episodes--ECGs likely show ST/SVT. Plan to raise tachy detection rate to 180bpm.

## 2018-09-05 NOTE — Telephone Encounter (Signed)
Additional tachy episode received on 09/02/18 and reviewed with Dr. Graciela Husbands. ECG appears SVT (sudden onset), duration 10sec, occurred on 08/31/18 at 2229. Some previous episodes showed gradual onset. Per Dr. Graciela Husbands, schedule non-urgent OV to discuss SVT. Will also consider raising tachy detection at this appointment.   Patient asymptomatic with episodes, denies palpitations or other cardiac symptoms. Patient and wife agreeable to appointment with Dr. Graciela Husbands on 10/29/18 at 9:15am. They are appreciative of call and deny questions or concerns at this time.

## 2018-09-08 ENCOUNTER — Other Ambulatory Visit: Payer: Self-pay | Admitting: Internal Medicine

## 2018-09-08 LAB — CUP PACEART REMOTE DEVICE CHECK
Date Time Interrogation Session: 20190727023618
Implantable Pulse Generator Implant Date: 20170721

## 2018-09-23 LAB — CUP PACEART REMOTE DEVICE CHECK
Date Time Interrogation Session: 20190829083932
Implantable Pulse Generator Implant Date: 20170721

## 2018-09-30 ENCOUNTER — Ambulatory Visit (INDEPENDENT_AMBULATORY_CARE_PROVIDER_SITE_OTHER): Payer: Medicare Other | Admitting: *Deleted

## 2018-09-30 DIAGNOSIS — I639 Cerebral infarction, unspecified: Secondary | ICD-10-CM

## 2018-09-30 NOTE — Progress Notes (Signed)
Carelink Summary Report / Loop Recorder 

## 2018-10-01 LAB — CUP PACEART REMOTE DEVICE CHECK
Date Time Interrogation Session: 20191001120949
Implantable Pulse Generator Implant Date: 20170721

## 2018-10-10 ENCOUNTER — Encounter: Payer: Self-pay | Admitting: Internal Medicine

## 2018-10-28 NOTE — Progress Notes (Signed)
./  skf      Patient Care Team: Irena Reichmann, DO as PCP - General (Family Medicine)   HPI  [name] is a 67 y.o. male seein in followup for ILR implanation for Cryptogenic Stroke   Seen as his loop recorder had demonstrated abrupt onset offset tachypalpitations.  No associated palpitations.  The patient denies chest pain, shortness of breath, nocturnal dyspnea, orthopnea or peripheral edema. There have been no lightheadedness or syncope.    5/17 Echo LV function normal   Records and Results Reviewed   Past Medical History:  Diagnosis Date  . Healthy adult   . Stroke [facility])     Past Surgical History:  Procedure Laterality Date  . EP IMPLANTABLE DEVICE N/A 07/20/2016   Procedure: Loop Recorder Insertion;  Surgeon: Duke Salvia, MD;  Location: [facility] INVASIVE CV LAB;  Service: Cardiovascular;  Laterality: N/A;    Current Meds  Medication Sig  . acetaminophen (TYLENOL) 500 MG tablet Take 500 mg by mouth every 6 (six) hours as needed (pain).   Marland Kitchen aspirin EC 325 MG EC tablet Take 1 tablet (325 mg total) by mouth daily.  Marland Kitchen atorvastatin (LIPITOR) 40 MG tablet Take 1 tablet (40 mg total) by mouth daily at 6 PM.  . meclizine (ANTIVERT) 12.5 MG tablet Take 1 tablet (12.5 mg total) by mouth 3 (three) times daily as needed for dizziness.  . ondansetron (ZOFRAN) 4 MG tablet Take 1 tablet (4 mg total) by mouth every 6 (six) hours as needed for nausea.   This is doing a Izola Price I think there is a lot of confusion as to whether she is  She is a woman has no objective data or not there is some is always No Known Allergies    Review of Systems negative except from HPI and PMH  Physical Exam BP 114/82   Pulse (!) 51   Ht 6\' 1"  (1.854 m)   Wt 202 lb 12.8 oz (92 kg)   SpO2 98%   BMI 26.76 kg/m  Well developed and well nourished in no acute distress HENT normal E scleral and icterus clear Neck Supple JVP flat; carotids brisk and full Clear to ausculation Regular rate and  rhythm, no murmurs gallops or rub Soft with active bowel sounds No clubbing cyanosis Edema Alert and oriented, grossly normal motor and sensory function Skin Warm and Dry    Assessment and  Plan Cryptogenic Stroke  SVT  No symptoms with svt   Will see prn        Current medicines are reviewed at length with the patient today .  The patient does not  have concerns regarding medicines.

## 2018-10-29 ENCOUNTER — Encounter: Payer: Self-pay | Admitting: Internal Medicine

## 2018-10-29 ENCOUNTER — Ambulatory Visit: Payer: Medicare Other | Admitting: Internal Medicine

## 2018-10-29 VITALS — BP 114/82 | HR 51 | Ht 73.0 in | Wt 202.8 lb

## 2018-10-29 DIAGNOSIS — I639 Cerebral infarction, unspecified: Secondary | ICD-10-CM

## 2018-10-29 LAB — CUP PACEART INCLINIC DEVICE CHECK
Date Time Interrogation Session: 20191030150953
Implantable Pulse Generator Implant Date: 20170721

## 2018-10-29 NOTE — Patient Instructions (Addendum)
Medication Instructions:  Your physician recommends that you continue on your current medications as directed. Please refer to the Current Medication list given to you today.  If you need a refill on your cardiac medications before your next appointment, please call your pharmacy.   Lab work: None ordered  Testing/Procedures: None ordered  Follow-Up: You will continue with monthly monitoring checks of your loop recorder.  Your next scheduled remote date is 11/03/2018.  No follow up is needed at this time with Dr. Graciela Husbands.  He will see you on an as needed basis.  Thank you for choosing [facility]!!

## 2018-11-03 ENCOUNTER — Ambulatory Visit (INDEPENDENT_AMBULATORY_CARE_PROVIDER_SITE_OTHER): Payer: Medicare Other | Admitting: *Deleted

## 2018-11-03 DIAGNOSIS — I639 Cerebral infarction, unspecified: Secondary | ICD-10-CM

## 2018-11-03 NOTE — Progress Notes (Signed)
Carelink Summary Report / Loop Recorder 

## 2018-12-05 ENCOUNTER — Ambulatory Visit (INDEPENDENT_AMBULATORY_CARE_PROVIDER_SITE_OTHER): Payer: Medicare Other

## 2018-12-05 DIAGNOSIS — I639 Cerebral infarction, unspecified: Secondary | ICD-10-CM

## 2018-12-08 NOTE — Progress Notes (Signed)
Carelink Summary Report / Loop Recorder 

## 2018-12-27 LAB — CUP PACEART REMOTE DEVICE CHECK
Date Time Interrogation Session: 20191103131051
Implantable Pulse Generator Implant Date: 20170721

## 2019-01-07 ENCOUNTER — Ambulatory Visit (INDEPENDENT_AMBULATORY_CARE_PROVIDER_SITE_OTHER): Payer: Medicare Other

## 2019-01-07 DIAGNOSIS — I639 Cerebral infarction, unspecified: Secondary | ICD-10-CM | POA: Diagnosis not present

## 2019-01-08 LAB — CUP PACEART REMOTE DEVICE CHECK
Date Time Interrogation Session: 20200108133819
Implantable Pulse Generator Implant Date: 20170721

## 2019-01-08 NOTE — Progress Notes (Signed)
Carelink Summary Report / Loop Recorder 

## 2019-01-18 LAB — CUP PACEART REMOTE DEVICE CHECK
Date Time Interrogation Session: 20191206133656
Implantable Pulse Generator Implant Date: 20170721

## 2019-02-09 ENCOUNTER — Ambulatory Visit (INDEPENDENT_AMBULATORY_CARE_PROVIDER_SITE_OTHER): Payer: Medicare Other

## 2019-02-09 DIAGNOSIS — I639 Cerebral infarction, unspecified: Secondary | ICD-10-CM

## 2019-02-09 LAB — CUP PACEART REMOTE DEVICE CHECK
Date Time Interrogation Session: 20200210133827
Implantable Pulse Generator Implant Date: 20170721

## 2019-02-19 NOTE — Progress Notes (Signed)
Carelink Summary Report / Loop Recorder 

## 2019-03-14 LAB — CUP PACEART REMOTE DEVICE CHECK
Date Time Interrogation Session: 20200314140651
Implantable Pulse Generator Implant Date: 20170721

## 2019-03-16 ENCOUNTER — Other Ambulatory Visit: Payer: Self-pay

## 2019-03-16 ENCOUNTER — Ambulatory Visit (INDEPENDENT_AMBULATORY_CARE_PROVIDER_SITE_OTHER): Payer: Medicare Other | Admitting: *Deleted

## 2019-03-16 DIAGNOSIS — I639 Cerebral infarction, unspecified: Secondary | ICD-10-CM

## 2019-03-23 NOTE — Progress Notes (Signed)
Carelink Summary Report / Loop Recorder 

## 2019-04-10 ENCOUNTER — Emergency Department (HOSPITAL_COMMUNITY): Payer: Medicare Other

## 2019-04-10 ENCOUNTER — Other Ambulatory Visit: Payer: Self-pay

## 2019-04-10 ENCOUNTER — Emergency Department (HOSPITAL_COMMUNITY)
Admission: EM | Admit: 2019-04-10 | Discharge: 2019-04-10 | Disposition: A | Discharge status: Home / Self Care | Payer: Medicare Other | Attending: Emergency Medicine | Admitting: Emergency Medicine

## 2019-04-10 DIAGNOSIS — S0101XA Laceration without foreign body of scalp, initial encounter: Secondary | ICD-10-CM | POA: Insufficient documentation

## 2019-04-10 DIAGNOSIS — S0990XA Unspecified injury of head, initial encounter: Secondary | ICD-10-CM

## 2019-04-10 DIAGNOSIS — Y929 Unspecified place or not applicable: Secondary | ICD-10-CM | POA: Insufficient documentation

## 2019-04-10 DIAGNOSIS — Z7982 Long term (current) use of aspirin: Secondary | ICD-10-CM | POA: Insufficient documentation

## 2019-04-10 DIAGNOSIS — R51 Headache: Secondary | ICD-10-CM | POA: Diagnosis not present

## 2019-04-10 DIAGNOSIS — Z79899 Other long term (current) drug therapy: Secondary | ICD-10-CM | POA: Insufficient documentation

## 2019-04-10 DIAGNOSIS — Y999 Unspecified external cause status: Secondary | ICD-10-CM | POA: Insufficient documentation

## 2019-04-10 DIAGNOSIS — Y939 Activity, unspecified: Secondary | ICD-10-CM | POA: Diagnosis not present

## 2019-04-10 DIAGNOSIS — W11XXXA Fall on and from ladder, initial encounter: Secondary | ICD-10-CM | POA: Diagnosis not present

## 2019-04-10 NOTE — ED Notes (Signed)
Patient transported to CT 

## 2019-04-10 NOTE — Discharge Instructions (Addendum)
Staple removal in 10 days.

## 2019-04-10 NOTE — ED Notes (Signed)
Patient verbalizes understanding of discharge instructions. Opportunity for questioning and answers were provided. Armband removed by staff, pt discharged from ED ambulatory.   

## 2019-04-10 NOTE — ED Provider Notes (Signed)
[facility] EMERGENCY DEPARTMENT Provider Note   CSN: [id number] Arrival date & time: 04/10/19  1548    History   Chief Complaint Chief Complaint  Patient presents with  . Fall    HPI Grayden Holms Dambra is a 68 y.o. male.     HPI Patient is a 68 year old male who presents to the emergency department after a fall from a ladder.  He was approximately 5 feet up on the ladder when he fell backwards striking the back of his head on concrete.  He is unsure if he had any loss consciousness.  EMS was called.  He denies arm or leg pain.  No weakness in his arms or legs or associated paresthesias.  Denies significant neck pain but does report rather severe posterior head pain.  He is on aspirin but no other anticoagulants.  No vomiting.  Denies nausea at this time.  No significant altered mental status.   Past Medical History:  Diagnosis Date  . Healthy adult   . Stroke [facility])     Patient Active Problem List   Diagnosis Date Noted  . 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 HLD (hyperlipidemia)   . Nausea without vomiting 05/16/2016  . Cerebellar stroke (HCC) 05/15/2016  . Acute right PCA stroke (HCC) 05/10/2016  . Acute CVA (cerebrovascular accident) (HCC) 05/10/2016  . Emesis   . Vertigo 05/08/2016  . Intractable nausea and vomiting 05/08/2016    Past Surgical History:  Procedure Laterality Date  . EP IMPLANTABLE DEVICE N/A 07/20/2016   Procedure: Loop Recorder Insertion;  Surgeon: Duke Salvia, MD;  Location: [facility] INVASIVE CV LAB;  Service: Cardiovascular;  Laterality: N/A;        Home Medications    Prior to Admission medications   Medication Sig Start Date End Date Taking? Authorizing Provider  acetaminophen (TYLENOL) 500 MG tablet Take 500 mg by mouth every 6 (six) hours as needed (pain).     [provider]  aspirin EC 81 MG tablet Take 81 mg by mouth daily.    [provider]  atorvastatin (LIPITOR) 40 MG tablet Take 1 tablet (40  mg total) by mouth daily at 6 PM. 05/18/16   Love, Evlyn Kanner, PA-C  meclizine (ANTIVERT) 12.5 MG tablet Take 1 tablet (12.5 mg total) by mouth 3 (three) times daily as needed for dizziness. 05/18/16   Love, Evlyn Kanner, PA-C  ondansetron (ZOFRAN) 4 MG tablet Take 1 tablet (4 mg total) by mouth every 6 (six) hours as needed for nausea. 05/22/16   Kirsteins, Victorino Sparrow, MD    Family History Family History  Problem Relation Age of Onset  . Stroke Mother 19  . Diabetes Father   . Cancer Brother        lung  . Heart attack Neg Hx   . Hypertension Neg Hx     Social History Social History   Tobacco Use  . Smoking status: Never Smoker  . Smokeless tobacco: Never Used  Substance Use Topics  . Alcohol use: No  . Drug use: No     Allergies   Patient has no known allergies.   Review of Systems Review of Systems  All other systems reviewed and are negative.    Physical Exam Updated Vital Signs Ht 6' (1.829 m)   Wt 90.7 kg   BMI 27.12 kg/m   Physical Exam Vitals signs and nursing note reviewed.  Constitutional:      Appearance: He is well-developed.  HENT:  Head: Normocephalic.     Comments: Posterior scalp hematoma with associated abrasion Eyes:     Pupils: Pupils are equal, round, and reactive to light.  Neck:     Musculoskeletal: Normal range of motion.  Cardiovascular:     Rate and Rhythm: Normal rate and regular rhythm.     Heart sounds: Normal heart sounds.  Pulmonary:     Effort: Pulmonary effort is normal. No respiratory distress.     Breath sounds: Normal breath sounds.  Abdominal:     General: There is no distension.     Palpations: Abdomen is soft.     Tenderness: There is no abdominal tenderness.  Musculoskeletal: Normal range of motion.  Skin:    General: Skin is warm and dry.  Neurological:     Mental Status: He is alert and oriented to person, place, and time.  Psychiatric:        Judgment: Judgment normal.      ED Treatments / Results  Labs  (all labs ordered are listed, but only abnormal results are displayed) Labs Reviewed - No data to display  EKG None  Radiology No results found.  Procedures .Marland Kitchen.Laceration Repair Date/Time: 04/10/2019 6:48 PM Performed by: Azalia Bilisampos, Tison Leibold, MD Authorized by: Azalia Bilisampos, Kiwana Deblasi, MD   Consent:    Consent obtained:  Verbal   Consent given by:  Patient   Risks discussed:  Infection and pain Anesthesia (see MAR for exact dosages):    Anesthesia method:  None Laceration details:    Location:  Scalp   Scalp location:  Crown   Length (cm):  2.5 Repair type:    Repair type:  Simple Exploration:    Contaminated: no   Treatment:    Area cleansed with:  Betadine   Amount of cleaning:  Standard   Irrigation solution:  Tap water   Visualized foreign bodies/material removed: no   Skin repair:    Repair method:  Staples   Number of staples:  3 Post-procedure details:    Patient tolerance of procedure:  Tolerated well, no immediate complications   (including critical care time)  Medications Ordered in ED Medications - No data to display   Initial Impression / Assessment and Plan / ED Course  I have reviewed the triage vital signs and the nursing notes.  Pertinent labs & imaging results that were available during my care of the patient were reviewed by me and considered in my medical decision making (see chart for details).        CT head and C-spine without acute traumatic pathology.  I personally reviewed the patient's CT scan.  Laceration repaired.  Infection warnings given.  Head injury warnings given.  Staple removal in 10 days.  Final Clinical Impressions(s) / ED Diagnoses   Final diagnoses:  Injury of head, initial encounter  Scalp laceration, initial encounter    ED Discharge Orders    None       Azalia Bilisampos, Ariaunna Longsworth, MD 04/10/19 1850

## 2019-04-10 NOTE — ED Notes (Signed)
ED Provider at bedside. 

## 2019-04-10 NOTE — ED Triage Notes (Signed)
Pt brought in by ems for c/o fall approx 5 feet  from a ladder; pt fell on to concrete , unknown if he had LOC ; upon ems arrival , patient was alert and oriented x 4 and following simple commands ; laceration to the top of the head  ; bleeding controlled  ; denies any neck or back pain ; only c/o head pain

## 2019-04-11 ENCOUNTER — Telehealth: Payer: Self-pay | Admitting: Surgery

## 2019-04-11 NOTE — Telephone Encounter (Signed)
ED CM received call from patient's spouse concerning husband's prescription not being called in to pharmacy. ED CM reviewed record no discharge prescription noted in record. She stated on the AVS medications are noted, CM explained that the meds on AVS she is referencing are what patient had been prescribed in the past, along with what he was given in  the ED.  No discharge prescription noted,  patient encouraged to follow discharge instructions on AVS, verbalized understanding and teach back performed. No further questions or concerns verbalized.

## 2019-04-16 ENCOUNTER — Ambulatory Visit (INDEPENDENT_AMBULATORY_CARE_PROVIDER_SITE_OTHER): Payer: Medicare Other | Admitting: *Deleted

## 2019-04-16 ENCOUNTER — Other Ambulatory Visit: Payer: Self-pay

## 2019-04-16 DIAGNOSIS — I639 Cerebral infarction, unspecified: Secondary | ICD-10-CM | POA: Diagnosis not present

## 2019-04-16 LAB — CUP PACEART REMOTE DEVICE CHECK
Date Time Interrogation Session: 20200416143707
Implantable Pulse Generator Implant Date: 20170721

## 2019-04-22 NOTE — Progress Notes (Signed)
Carelink Summary Report / Loop Recorder 

## 2019-05-19 ENCOUNTER — Ambulatory Visit (INDEPENDENT_AMBULATORY_CARE_PROVIDER_SITE_OTHER): Payer: Medicare Other | Admitting: *Deleted

## 2019-05-19 ENCOUNTER — Other Ambulatory Visit: Payer: Self-pay

## 2019-05-19 DIAGNOSIS — I639 Cerebral infarction, unspecified: Secondary | ICD-10-CM | POA: Diagnosis not present

## 2019-05-20 LAB — CUP PACEART REMOTE DEVICE CHECK
Date Time Interrogation Session: 20200519154009
Implantable Pulse Generator Implant Date: 20170721

## 2019-05-28 NOTE — Progress Notes (Signed)
Carelink Summary Report / Loop Recorder 

## 2019-06-22 ENCOUNTER — Ambulatory Visit (INDEPENDENT_AMBULATORY_CARE_PROVIDER_SITE_OTHER): Payer: Medicare Other | Admitting: *Deleted

## 2019-06-22 DIAGNOSIS — I639 Cerebral infarction, unspecified: Secondary | ICD-10-CM | POA: Diagnosis not present

## 2019-06-22 LAB — CUP PACEART REMOTE DEVICE CHECK
Date Time Interrogation Session: 20200621160807
Implantable Pulse Generator Implant Date: 20170721

## 2019-06-29 NOTE — Progress Notes (Signed)
Carelink Summary Report / Loop Recorder 

## 2019-07-24 ENCOUNTER — Ambulatory Visit (INDEPENDENT_AMBULATORY_CARE_PROVIDER_SITE_OTHER): Payer: Medicare Other | Admitting: *Deleted

## 2019-07-24 DIAGNOSIS — I639 Cerebral infarction, unspecified: Secondary | ICD-10-CM

## 2019-07-24 LAB — CUP PACEART REMOTE DEVICE CHECK
Date Time Interrogation Session: 20200724145159
Implantable Pulse Generator Implant Date: 20170721

## 2019-07-30 NOTE — Progress Notes (Signed)
Carelink Summary Report / Loop Recorder 

## 2019-08-26 ENCOUNTER — Ambulatory Visit (INDEPENDENT_AMBULATORY_CARE_PROVIDER_SITE_OTHER): Payer: Medicare Other | Admitting: *Deleted

## 2019-08-26 DIAGNOSIS — I639 Cerebral infarction, unspecified: Secondary | ICD-10-CM

## 2019-08-26 DIAGNOSIS — I6389 Other cerebral infarction: Secondary | ICD-10-CM | POA: Diagnosis not present

## 2019-08-26 LAB — CUP PACEART REMOTE DEVICE CHECK
Date Time Interrogation Session: 20200826175919
Implantable Pulse Generator Implant Date: 20170721

## 2019-09-01 ENCOUNTER — Telehealth: Payer: Self-pay | Admitting: Internal Medicine

## 2019-09-01 NOTE — Telephone Encounter (Signed)
Patient's wife calling about his loop recorder, she wants to know if it is necessary for the patient to still have as the patient has had it for 3 years and they has been no change.

## 2019-09-02 NOTE — Telephone Encounter (Signed)
Will defer to Dr Caryl Comes

## 2019-09-03 NOTE — Progress Notes (Signed)
Carelink Summary Report / Loop Recorder 

## 2019-09-03 NOTE — Telephone Encounter (Signed)
No problem about stopping-- would he like the recorder removed

## 2019-09-08 NOTE — Telephone Encounter (Signed)
LVM for return call. 

## 2019-09-09 NOTE — Telephone Encounter (Signed)
Patient's spouse returned call. Please call.

## 2019-09-09 NOTE — Telephone Encounter (Signed)
Spoke with the patient regarding his loop recorder and advised him that it would be okay to have it removed per Dr.Klein. Pt is wondering if he should keep it or if it would be best for him to have it removed since it has been 3 years, pt would like Dr. Olin Pia advice on what would be best for his particular situation.

## 2019-09-14 NOTE — Telephone Encounter (Signed)
At his age would recommend taking it out, although some people would not do so It can be done in the office

## 2019-09-25 NOTE — Telephone Encounter (Signed)
Pt does not necessarily want to have the device extracted. He would like to change his remote checks to every 90 days vs every 30 days for insurance/money reasons. I advised pt Dr. Caryl Comes will have to approve this. We will contact him back with his new remote device schedule.

## 2019-09-25 NOTE — Telephone Encounter (Signed)
Follow Up:    Returning  [name]'s call from today.

## 2019-09-25 NOTE — Telephone Encounter (Signed)
We can stop following   Its been 3 years and the battery is almost worn out   We can take it out if he would like, not unreasonable as he is a young(ish)guy  His choice

## 2019-09-25 NOTE — Telephone Encounter (Signed)
LMOVM for pt to return call 

## 2019-09-28 NOTE — Telephone Encounter (Signed)
I spoke with the pt and let him know per Dr Caryl Comes phone note that he do not have to be followed with his loop recorder. I told him it is his choice to have it removed or to leave it in. The pt chose to leave the loop implanted but do not want to be followed. I cancelled his next home remote appointment in epic. I marked the pt inactive in [facility], unrolled him in [facility] and ordered the pt a return kit for his monitor. The pt thanked me for the call and verbalized understanding.

## 2019-09-28 NOTE — Telephone Encounter (Signed)
Left a message with the pt wife for him to call my direct office number. The pt wife verbalized understanding.

## 2020-01-28 ENCOUNTER — Ambulatory Visit: Payer: Medicare Other

## 2020-02-05 ENCOUNTER — Ambulatory Visit: Payer: Medicare PPO | Attending: Internal Medicine

## 2020-02-08 ENCOUNTER — Ambulatory Visit: Payer: Medicare Other

## 2020-03-16 DIAGNOSIS — R234 Changes in skin texture: Secondary | ICD-10-CM | POA: Diagnosis not present

## 2020-04-24 DIAGNOSIS — H6123 Impacted cerumen, bilateral: Secondary | ICD-10-CM | POA: Diagnosis not present

## 2020-06-09 DIAGNOSIS — L308 Other specified dermatitis: Secondary | ICD-10-CM | POA: Diagnosis not present

## 2020-06-09 DIAGNOSIS — L821 Other seborrheic keratosis: Secondary | ICD-10-CM | POA: Diagnosis not present

## 2020-06-09 DIAGNOSIS — D225 Melanocytic nevi of trunk: Secondary | ICD-10-CM | POA: Diagnosis not present

## 2020-09-13 DIAGNOSIS — L308 Other specified dermatitis: Secondary | ICD-10-CM | POA: Diagnosis not present

## 2020-09-13 DIAGNOSIS — L011 Impetiginization of other dermatoses: Secondary | ICD-10-CM | POA: Diagnosis not present

## 2020-10-14 DIAGNOSIS — Z23 Encounter for immunization: Secondary | ICD-10-CM | POA: Diagnosis not present

## 2020-11-22 DIAGNOSIS — R972 Elevated prostate specific antigen [PSA]: Secondary | ICD-10-CM | POA: Diagnosis not present

## 2020-11-22 DIAGNOSIS — E782 Mixed hyperlipidemia: Secondary | ICD-10-CM | POA: Diagnosis not present

## 2020-11-22 DIAGNOSIS — Z8673 Personal history of transient ischemic attack (TIA), and cerebral infarction without residual deficits: Secondary | ICD-10-CM | POA: Diagnosis not present

## 2020-11-28 ENCOUNTER — Other Ambulatory Visit: Payer: Medicare PPO

## 2020-11-28 DIAGNOSIS — Z20822 Contact with and (suspected) exposure to covid-19: Secondary | ICD-10-CM | POA: Diagnosis not present

## 2020-11-29 LAB — NOVEL CORONAVIRUS, NAA: SARS-CoV-2, NAA: NOT DETECTED

## 2020-11-29 LAB — SARS-COV-2, NAA 2 DAY TAT

## 2020-12-20 DIAGNOSIS — Z0000 Encounter for general adult medical examination without abnormal findings: Secondary | ICD-10-CM | POA: Diagnosis not present

## 2020-12-20 DIAGNOSIS — E7800 Pure hypercholesterolemia, unspecified: Secondary | ICD-10-CM | POA: Diagnosis not present

## 2020-12-20 DIAGNOSIS — L309 Dermatitis, unspecified: Secondary | ICD-10-CM | POA: Diagnosis not present

## 2020-12-20 DIAGNOSIS — R7309 Other abnormal glucose: Secondary | ICD-10-CM | POA: Diagnosis not present

## 2020-12-20 DIAGNOSIS — Z8673 Personal history of transient ischemic attack (TIA), and cerebral infarction without residual deficits: Secondary | ICD-10-CM | POA: Diagnosis not present

## 2020-12-20 DIAGNOSIS — H6123 Impacted cerumen, bilateral: Secondary | ICD-10-CM | POA: Diagnosis not present

## 2020-12-20 DIAGNOSIS — R946 Abnormal results of thyroid function studies: Secondary | ICD-10-CM | POA: Diagnosis not present

## 2020-12-20 DIAGNOSIS — R972 Elevated prostate specific antigen [PSA]: Secondary | ICD-10-CM | POA: Diagnosis not present

## 2021-01-03 DIAGNOSIS — Z1212 Encounter for screening for malignant neoplasm of rectum: Secondary | ICD-10-CM | POA: Diagnosis not present

## 2021-01-03 DIAGNOSIS — Z1211 Encounter for screening for malignant neoplasm of colon: Secondary | ICD-10-CM | POA: Diagnosis not present

## 2021-01-13 LAB — COLOGUARD: COLOGUARD: NEGATIVE

## 2021-03-09 DIAGNOSIS — H6122 Impacted cerumen, left ear: Secondary | ICD-10-CM | POA: Diagnosis not present

## 2021-07-02 DIAGNOSIS — R509 Fever, unspecified: Secondary | ICD-10-CM | POA: Diagnosis not present

## 2021-07-02 DIAGNOSIS — U071 COVID-19: Secondary | ICD-10-CM | POA: Diagnosis not present

## 2021-07-02 DIAGNOSIS — R5383 Other fatigue: Secondary | ICD-10-CM | POA: Diagnosis not present

## 2021-07-02 DIAGNOSIS — J029 Acute pharyngitis, unspecified: Secondary | ICD-10-CM | POA: Diagnosis not present

## 2021-07-02 DIAGNOSIS — R059 Cough, unspecified: Secondary | ICD-10-CM | POA: Diagnosis not present

## 2021-07-26 ENCOUNTER — Other Ambulatory Visit: Payer: Self-pay

## 2021-07-26 ENCOUNTER — Encounter (HOSPITAL_BASED_OUTPATIENT_CLINIC_OR_DEPARTMENT_OTHER): Payer: Self-pay | Admitting: *Deleted

## 2021-07-26 ENCOUNTER — Encounter (HOSPITAL_COMMUNITY)
Admission: EM | Disposition: A | Discharge status: Home / Self Care | Payer: Self-pay | Source: Home / Self Care | Attending: Emergency Medicine

## 2021-07-26 ENCOUNTER — Emergency Department (HOSPITAL_COMMUNITY): Payer: Medicare PPO | Admitting: Certified Registered Nurse Anesthetist

## 2021-07-26 ENCOUNTER — Emergency Department (HOSPITAL_BASED_OUTPATIENT_CLINIC_OR_DEPARTMENT_OTHER): Payer: Medicare PPO

## 2021-07-26 ENCOUNTER — Ambulatory Visit (HOSPITAL_BASED_OUTPATIENT_CLINIC_OR_DEPARTMENT_OTHER)
Admission: EM | Admit: 2021-07-26 | Discharge: 2021-07-26 | Disposition: A | Discharge status: Home / Self Care | Payer: Medicare PPO | Attending: Emergency Medicine | Admitting: Emergency Medicine

## 2021-07-26 DIAGNOSIS — M7989 Other specified soft tissue disorders: Secondary | ICD-10-CM

## 2021-07-26 DIAGNOSIS — M65142 Other infective (teno)synovitis, left hand: Secondary | ICD-10-CM | POA: Insufficient documentation

## 2021-07-26 DIAGNOSIS — I69393 Ataxia following cerebral infarction: Secondary | ICD-10-CM | POA: Diagnosis not present

## 2021-07-26 DIAGNOSIS — M19042 Primary osteoarthritis, left hand: Secondary | ICD-10-CM | POA: Insufficient documentation

## 2021-07-26 DIAGNOSIS — E785 Hyperlipidemia, unspecified: Secondary | ICD-10-CM | POA: Diagnosis not present

## 2021-07-26 DIAGNOSIS — Z20822 Contact with and (suspected) exposure to covid-19: Secondary | ICD-10-CM | POA: Insufficient documentation

## 2021-07-26 HISTORY — PX: I & D EXTREMITY: SHX5045

## 2021-07-26 LAB — CBC WITH DIFFERENTIAL/PLATELET
Abs Immature Granulocytes: 0.02 10*3/uL (ref 0.00–0.07)
Basophils Absolute: 0 10*3/uL (ref 0.0–0.1)
Basophils Relative: 0 %
Eosinophils Absolute: 0.1 10*3/uL (ref 0.0–0.5)
Eosinophils Relative: 1 %
HCT: 44.4 % (ref 39.0–52.0)
Hemoglobin: 15.1 g/dL (ref 13.0–17.0)
Immature Granulocytes: 0 %
Lymphocytes Relative: 18 %
Lymphs Abs: 1.8 10*3/uL (ref 0.7–4.0)
MCH: 33.6 pg (ref 26.0–34.0)
MCHC: 34 g/dL (ref 30.0–36.0)
MCV: 98.9 fL (ref 80.0–100.0)
Monocytes Absolute: 1.3 10*3/uL — ABNORMAL HIGH (ref 0.1–1.0)
Monocytes Relative: 12 %
Neutro Abs: 7.1 10*3/uL (ref 1.7–7.7)
Neutrophils Relative %: 69 %
Platelets: 115 10*3/uL — ABNORMAL LOW (ref 150–400)
RBC: 4.49 MIL/uL (ref 4.22–5.81)
RDW: 13.2 % (ref 11.5–15.5)
WBC: 10.4 10*3/uL (ref 4.0–10.5)
nRBC: 0 % (ref 0.0–0.2)

## 2021-07-26 LAB — COMPREHENSIVE METABOLIC PANEL
ALT: 19 U/L (ref 0–44)
AST: 20 U/L (ref 15–41)
Albumin: 3.7 g/dL (ref 3.5–5.0)
Alkaline Phosphatase: 48 U/L (ref 38–126)
Anion gap: 6 (ref 5–15)
BUN: 10 mg/dL (ref 8–23)
CO2: 25 mmol/L (ref 22–32)
Calcium: 9 mg/dL (ref 8.9–10.3)
Chloride: 108 mmol/L (ref 98–111)
Creatinine, Ser: 0.9 mg/dL (ref 0.61–1.24)
GFR, Estimated: >60 mL/min (ref >60)
Glucose, Bld: 93 mg/dL (ref 70–99)
Potassium: 3.7 mmol/L (ref 3.5–5.1)
Sodium: 139 mmol/L (ref 135–145)
Total Bilirubin: 1.1 mg/dL (ref 0.3–1.2)
Total Protein: 6.4 g/dL — ABNORMAL LOW (ref 6.5–8.1)

## 2021-07-26 LAB — LACTIC ACID, PLASMA
Lactic Acid, Venous: 1 mmol/L (ref 0.5–1.9)
Lactic Acid, Venous: 0.9 mmol/L (ref 0.5–1.9)

## 2021-07-26 LAB — RESP PANEL BY RT-PCR (FLU A&B, COVID) ARPGX2
Influenza A by PCR: NEGATIVE
Influenza B by PCR: NEGATIVE
SARS Coronavirus 2 by RT PCR: NEGATIVE

## 2021-07-26 SURGERY — IRRIGATION AND DEBRIDEMENT EXTREMITY
Anesthesia: General | Site: Finger | Laterality: Left

## 2021-07-26 MED ORDER — DOXYCYCLINE HYCLATE 50 MG PO CAPS: 50.0000 mg | ORAL_CAPSULE | Freq: Two times a day (BID) | ORAL | 0 refills | Status: AC

## 2021-07-26 MED ORDER — MIDAZOLAM HCL 2 MG/2ML IJ SOLN
INTRAMUSCULAR | Status: AC
  Filled 2021-07-26: qty 2

## 2021-07-26 MED ORDER — FENTANYL CITRATE (PF) 250 MCG/5ML IJ SOLN
INTRAMUSCULAR | Status: AC
  Filled 2021-07-26: qty 5

## 2021-07-26 MED ORDER — FENTANYL CITRATE (PF) 100 MCG/2ML IJ SOLN
INTRAMUSCULAR | Status: AC
  Filled 2021-07-26: qty 2

## 2021-07-26 MED ORDER — ONDANSETRON HCL 4 MG/2ML IJ SOLN
INTRAMUSCULAR | Status: DC | PRN
  Administered 2021-07-26: 4 mg via INTRAVENOUS

## 2021-07-26 MED ORDER — PROPOFOL 10 MG/ML IV BOLUS
INTRAVENOUS | Status: DC | PRN
Start: 2021-07-26 — End: 2021-07-26
  Administered 2021-07-26: 160 mg via INTRAVENOUS

## 2021-07-26 MED ORDER — SODIUM CHLORIDE 0.9 % IV SOLN: Freq: Once | INTRAVENOUS | Status: AC

## 2021-07-26 MED ORDER — CHLORHEXIDINE GLUCONATE 0.12 % MT SOLN
15.0000 mL | Freq: Once | OROMUCOSAL | Status: AC
  Administered 2021-07-26: 15 mL via OROMUCOSAL
  Filled 2021-07-26: qty 15

## 2021-07-26 MED ORDER — OXYCODONE-ACETAMINOPHEN 10-325 MG PO TABS: 1.0000 | ORAL_TABLET | Freq: Four times a day (QID) | ORAL | 0 refills | Status: AC | PRN

## 2021-07-26 MED ORDER — FENTANYL CITRATE (PF) 250 MCG/5ML IJ SOLN
INTRAMUSCULAR | Status: DC | PRN
  Administered 2021-07-26 (×2): 25 ug via INTRAVENOUS
  Administered 2021-07-26: 50 ug via INTRAVENOUS

## 2021-07-26 MED ORDER — LIDOCAINE 2% (20 MG/ML) 5 ML SYRINGE
INTRAMUSCULAR | Status: DC | PRN
  Administered 2021-07-26: 100 mg via INTRAVENOUS

## 2021-07-26 MED ORDER — BUPIVACAINE HCL (PF) 0.25 % IJ SOLN
INTRAMUSCULAR | Status: AC
  Filled 2021-07-26: qty 10

## 2021-07-26 MED ORDER — CEFAZOLIN SODIUM-DEXTROSE 1-4 GM/50ML-% IV SOLN
1.0000 g | Freq: Once | INTRAVENOUS | Status: AC
  Administered 2021-07-26: 1 g via INTRAVENOUS
  Filled 2021-07-26: qty 50

## 2021-07-26 MED ORDER — ACETAMINOPHEN 10 MG/ML IV SOLN
INTRAVENOUS | Status: DC | PRN
  Administered 2021-07-26: 1000 mg via INTRAVENOUS

## 2021-07-26 MED ORDER — ORAL CARE MOUTH RINSE: 15.0000 mL | Freq: Once | OROMUCOSAL | Status: AC

## 2021-07-26 MED ORDER — MIDAZOLAM HCL 2 MG/2ML IJ SOLN
INTRAMUSCULAR | Status: DC | PRN
  Administered 2021-07-26: 2 mg via INTRAVENOUS

## 2021-07-26 MED ORDER — SODIUM CHLORIDE 0.9 % IR SOLN
Status: DC | PRN
  Administered 2021-07-26: 1000 mL

## 2021-07-26 MED ORDER — HYDROCODONE-ACETAMINOPHEN 5-325 MG PO TABS
1.0000 | ORAL_TABLET | Freq: Once | ORAL | Status: AC
  Administered 2021-07-26: 1 via ORAL
  Filled 2021-07-26: qty 1

## 2021-07-26 MED ORDER — ONDANSETRON HCL 4 MG/2ML IJ SOLN
INTRAMUSCULAR | Status: AC
  Filled 2021-07-26: qty 2

## 2021-07-26 MED ORDER — HYDROMORPHONE HCL 1 MG/ML IJ SOLN
0.5000 mg | INTRAMUSCULAR | Status: DC | PRN
  Administered 2021-07-26 (×3): 0.5 mg via INTRAVENOUS
  Filled 2021-07-26 (×2): qty 1

## 2021-07-26 MED ORDER — PROPOFOL 10 MG/ML IV BOLUS
INTRAVENOUS | Status: AC
  Filled 2021-07-26: qty 20

## 2021-07-26 MED ORDER — FENTANYL CITRATE (PF) 100 MCG/2ML IJ SOLN
25.0000 ug | INTRAMUSCULAR | Status: DC | PRN
  Administered 2021-07-26: 50 ug via INTRAVENOUS

## 2021-07-26 MED ORDER — DEXAMETHASONE SODIUM PHOSPHATE 10 MG/ML IJ SOLN
INTRAMUSCULAR | Status: AC
  Filled 2021-07-26: qty 1

## 2021-07-26 MED ORDER — BUPIVACAINE HCL (PF) 0.25 % IJ SOLN
INTRAMUSCULAR | Status: AC
  Filled 2021-07-26: qty 30

## 2021-07-26 MED ORDER — CEFAZOLIN SODIUM-DEXTROSE 2-4 GM/100ML-% IV SOLN
INTRAVENOUS | Status: AC
  Filled 2021-07-26: qty 100

## 2021-07-26 MED ORDER — DEXAMETHASONE SODIUM PHOSPHATE 10 MG/ML IJ SOLN
INTRAMUSCULAR | Status: DC | PRN
  Administered 2021-07-26: 10 mg via INTRAVENOUS

## 2021-07-26 MED ORDER — LACTATED RINGERS IV SOLN: INTRAVENOUS | Status: DC

## 2021-07-26 MED ORDER — CEFAZOLIN SODIUM-DEXTROSE 2-4 GM/100ML-% IV SOLN
2.0000 g | INTRAVENOUS | Status: AC
Start: 2021-07-26 — End: 2021-07-26
  Administered 2021-07-26: 2 g via INTRAVENOUS

## 2021-07-26 MED ORDER — ACETAMINOPHEN 10 MG/ML IV SOLN
INTRAVENOUS | Status: AC
  Filled 2021-07-26: qty 100

## 2021-07-26 SURGICAL SUPPLY — 47 items
BAG COUNTER SPONGE SURGICOUNT (BAG) ×2 IMPLANT
BNDG COHESIVE 2X5 TAN STRL LF (GAUZE/BANDAGES/DRESSINGS) ×2 IMPLANT
BNDG CONFORM 2 STRL LF (GAUZE/BANDAGES/DRESSINGS) ×4 IMPLANT
BNDG ELASTIC 3X5.8 VLCR STR LF (GAUZE/BANDAGES/DRESSINGS) ×2 IMPLANT
BNDG ELASTIC 4X5.8 VLCR STR LF (GAUZE/BANDAGES/DRESSINGS) ×2 IMPLANT
BNDG ESMARK 4X9 LF (GAUZE/BANDAGES/DRESSINGS) ×2 IMPLANT
BNDG GAUZE ELAST 4 BULKY (GAUZE/BANDAGES/DRESSINGS) ×2 IMPLANT
CORD BIPOLAR FORCEPS 12FT (ELECTRODE) ×2 IMPLANT
COVER SURGICAL LIGHT HANDLE (MISCELLANEOUS) ×2 IMPLANT
CUFF TOURN SGL QUICK 18X4 (TOURNIQUET CUFF) ×4 IMPLANT
DRAPE SURG 17X23 STRL (DRAPES) ×2 IMPLANT
DRSG ADAPTIC 3X8 NADH LF (GAUZE/BANDAGES/DRESSINGS) ×2 IMPLANT
DRSG EMULSION OIL 3X3 NADH (GAUZE/BANDAGES/DRESSINGS) ×2 IMPLANT
GAUZE SPONGE 2X2 8PLY STRL LF (GAUZE/BANDAGES/DRESSINGS) ×2 IMPLANT
GAUZE XEROFORM 1X8 LF (GAUZE/BANDAGES/DRESSINGS) ×2 IMPLANT
GLOVE SURG ORTHO LTX SZ8 (GLOVE) ×2 IMPLANT
GLOVE SURG POLYISO LF SZ6 (GLOVE) ×2 IMPLANT
GLOVE SURG UNDER POLY LF SZ8.5 (GLOVE) ×2 IMPLANT
GOWN STRL REUS W/ TWL LRG LVL3 (GOWN DISPOSABLE) ×3 IMPLANT
GOWN STRL REUS W/ TWL XL LVL3 (GOWN DISPOSABLE) ×2 IMPLANT
GOWN STRL REUS W/TWL LRG LVL3 (GOWN DISPOSABLE) ×6
GOWN STRL REUS W/TWL XL LVL3 (GOWN DISPOSABLE) ×4
HANDPIECE INTERPULSE COAX TIP (DISPOSABLE)
KIT BASIN OR (CUSTOM PROCEDURE TRAY) ×2 IMPLANT
KIT TURNOVER KIT B (KITS) ×2 IMPLANT
MANIFOLD NEPTUNE II (INSTRUMENTS) ×2 IMPLANT
NEEDLE HYPO 25GX1X1/2 BEV (NEEDLE) ×2 IMPLANT
NS IRRIG 1000ML POUR BTL (IV SOLUTION) ×2 IMPLANT
PACK ORTHO EXTREMITY (CUSTOM PROCEDURE TRAY) ×2 IMPLANT
PAD ARMBOARD 7.5X6 YLW CONV (MISCELLANEOUS) ×4 IMPLANT
PAD CAST 4YDX4 CTTN HI CHSV (CAST SUPPLIES) ×1 IMPLANT
PADDING CAST COTTON 4X4 STRL (CAST SUPPLIES) ×2
SET HNDPC FAN SPRY TIP SCT (DISPOSABLE) IMPLANT
SOAP 2 % CHG 4 OZ (WOUND CARE) ×2 IMPLANT
SPONGE GAUZE 2X2 STER 10/PKG (GAUZE/BANDAGES/DRESSINGS) ×2
SPONGE T-LAP 18X18 ~~LOC~~+RFID (SPONGE) ×2 IMPLANT
SPONGE T-LAP 4X18 ~~LOC~~+RFID (SPONGE) ×2 IMPLANT
SUT PROLENE 4 0 PS 2 18 (SUTURE) ×2 IMPLANT
SWAB COLLECTION DEVICE MRSA (MISCELLANEOUS) ×2 IMPLANT
SWAB CULTURE ESWAB REG 1ML (MISCELLANEOUS) ×2 IMPLANT
SYR CONTROL 10ML LL (SYRINGE) IMPLANT
TOWEL GREEN STERILE (TOWEL DISPOSABLE) ×2 IMPLANT
TOWEL GREEN STERILE FF (TOWEL DISPOSABLE) ×2 IMPLANT
TUBE CONNECTING 12X1/4 (SUCTIONS) ×2 IMPLANT
UNDERPAD 30X36 HEAVY ABSORB (UNDERPADS AND DIAPERS) ×2 IMPLANT
WATER STERILE IRR 1000ML POUR (IV SOLUTION) ×2 IMPLANT
YANKAUER SUCT BULB TIP NO VENT (SUCTIONS) ×2 IMPLANT

## 2021-07-26 NOTE — ED Provider Notes (Signed)
Patient is transferred from droppage facility for consultation with hand surgery.  Dr. Bernette Mayers saw the patient in consult with Dr. Orlan Leavens requesting transfer to this [facility] emergency department.  Patient reports he is a lot of pain in his index finger with increasing stiffness and redness overnight.  Redness is progressed and now is on the dorsum of the hand. Physical Exam  BP 128/81   Pulse 61   Temp 97.9 F (36.6 C)   Resp 20   Ht 6\' 1"  (1.854 m)   Wt 93 kg   SpO2 99%   BMI 27.05 kg/m   Physical Exam Constitutional:      Appearance: Normal appearance.     Comments: Patient is alert nontoxic clear mental status  Pulmonary:     Effort: Pulmonary effort is normal.  Musculoskeletal:     Comments: Moderate diffuse swelling of the left index finger.  Some streaking developing on the dorsum of the hand.  Pain with any range of motion  Neurological:     General: No focal deficit present.     Mental Status: He is alert and oriented to person, place, and time.    ED Course/Procedures   Clinical Course as of 07/26/21 1403  Wed Jul 26, 2021  1033 Spoke with Dr. Jul 28, 2021 who requests that the patient go to [facility] for evaluation. Wife will drive him. Dr. [facility] aware.  [CS]  1036 Xray neg for bony abnormality. Shows soft tissue swelling.  [CS]    Clinical Course User Index [CS] Donnald Garre, MD    Procedures  MDM  Patient transferred for further evaluation tenosynovitis.  Finger is erythematous and inflamed with a streak developing on the dorsum consistent with cellulitis or ascending infection.  We will start lab work, Ancef, fluids, n.p.o. and Dilaudid for pain with consultation to hand surgery       Pollyann Savoy, MD 07/26/21 [phone]

## 2021-07-26 NOTE — ED Provider Notes (Signed)
[facility] EMERGENCY DEPARTMENT Provider Note  CSN: [id number] Arrival date & time: 07/26/21 7001    History Chief Complaint  Patient presents with   Hand Pain    [name] is a 70 y.o. male right handed, reports several days of increasing pain and swelling his L index finger. He noticed a red spot on the dorsal finger late last week but pain has been severe since last night, worse with movement. No known injury but he works outside in the yard frequently and is unsure if he may have been stung or scraped by something. He denies fever. No prior history of same. Pain is radiating to radial wrist.    Past Medical History:  Diagnosis Date   Healthy adult    Stroke [facility])     Past Surgical History:  Procedure Laterality Date   EP IMPLANTABLE DEVICE N/A 07/20/2016   Procedure: Loop Recorder Insertion;  Surgeon: Duke Salvia, MD;  Location: [facility] INVASIVE CV LAB;  Service: Cardiovascular;  Laterality: N/A;    Family History  Problem Relation Age of Onset   Stroke Mother 92   Diabetes Father    Cancer Brother        lung   Heart attack Neg Hx    Hypertension Neg Hx     Social History   Tobacco Use   Smoking status: Never   Smokeless tobacco: Never  Vaping Use   Vaping Use: Never used  Substance Use Topics   Alcohol use: No   Drug use: No     Home Medications Prior to Admission medications   Medication Sig Start Date End Date Taking? Authorizing Provider  aspirin EC 81 MG tablet Take 81 mg by mouth at bedtime.    Yes [provider]  atorvastatin (LIPITOR) 40 MG tablet Take 1 tablet (40 mg total) by mouth daily at 6 PM. 05/18/16  Yes Love, Evlyn Kanner, PA-C  meclizine (ANTIVERT) 12.5 MG tablet Take 1 tablet (12.5 mg total) by mouth 3 (three) times daily as needed for dizziness. Patient not taking: Reported on 04/10/2019 05/18/16   Jacquelynn Cree, PA-C     Allergies    Patient has no known allergies.   Review of Systems   Review of  Systems A comprehensive review of systems was completed and negative except as noted in HPI.    Physical Exam BP (!) 132/95   Pulse 60   Temp 97.9 F (36.6 C)   Resp 18   Ht 6\' 1"  (1.854 m)   Wt 93 kg   SpO2 99%   BMI 27.05 kg/m   Physical Exam Vitals and nursing note reviewed.  Constitutional:      Appearance: Normal appearance.  HENT:     Head: Normocephalic and atraumatic.     Nose: Nose normal.     Mouth/Throat:     Mouth: Mucous membranes are moist.  Eyes:     Extraocular Movements: Extraocular movements intact.     Conjunctiva/sclera: Conjunctivae normal.  Cardiovascular:     Rate and Rhythm: Normal rate.  Pulmonary:     Effort: Pulmonary effort is normal.     Breath sounds: Normal breath sounds.  Abdominal:     General: Abdomen is flat.     Palpations: Abdomen is soft.     Tenderness: There is no abdominal tenderness.  Musculoskeletal:        General: No swelling. Normal range of motion.     Cervical back: Neck supple.  Comments: L index finger is diffusely swollen, there is some erythema dorsally, held in partial flexion and severe pain with passive flexion and extension.   Skin:    General: Skin is warm and dry.  Neurological:     General: No focal deficit present.     Mental Status: He is alert.  Psychiatric:        Mood and Affect: Mood normal.       ED Results / Procedures / Treatments   Labs (all labs ordered are listed, but only abnormal results are displayed) Labs Reviewed - No data to display  EKG None   Radiology DG Hand Complete Left  Result Date: 07/26/2021 CLINICAL DATA:  Pain, swelling and redness of the index finger. No known injury. EXAM: LEFT HAND - COMPLETE 3+ VIEW COMPARISON:  None. FINDINGS: There is diffuse soft tissue swelling involving the index finger but I do not see any gas in the soft tissues or radiopaque foreign body. No acute bony findings or destructive bony changes. Mild periarticular osteopenia noted. There  are also mild degenerative changes at the second and third MCP joints with suspected early hooked osteophytes. These findings can be seen with CPPD arthropathy. Mild degenerative changes at the [facility] joint of the thumb is noted. The radiocarpal and intercarpal joint spaces are maintained. No degenerative or erosive findings. No chondrocalcinosis. IMPRESSION: 1. Soft tissue swelling involving the index finger without gas in the soft tissues or radiopaque foreign body. 2. No acute bony findings. 3. Mild degenerative changes involving the second and third MCP joints. Electronically Signed   By: Rudie Meyer M.D.   On: 07/26/2021 10:26    Procedures Procedures  Medications Ordered in the ED Medications  HYDROcodone-acetaminophen (NORCO/VICODIN) 5-325 MG per tablet 1 tablet (1 tablet Oral Given 07/26/21 1023)     MDM Rules/Calculators/A&P MDM Patient with exam concerning for flexor tenosynovitis. Will give Norco for pain and discuss with Hand.   ED Course  I have reviewed the triage vital signs and the nursing notes.  Pertinent labs & imaging results that were available during my care of the patient were reviewed by me and considered in my medical decision making (see chart for details).  Clinical Course as of 07/26/21 1142  Wed Jul 26, 2021  1033 Spoke with Dr. Melvyn Novas who requests that the patient go to [facility] for evaluation. Wife will drive him. Dr. Donnald Garre aware.  [CS]  1036 Xray neg for bony abnormality. Shows soft tissue swelling.  [CS]    Clinical Course User Index [CS] Pollyann Savoy, MD    Final Clinical Impression(s) / ED Diagnoses Final diagnoses:  Finger swelling    Rx / DC Orders ED Discharge Orders     None        Pollyann Savoy, MD 07/26/21 1142

## 2021-07-26 NOTE — H&P (Addendum)
[name] is an 70 y.o. male.   Chief Complaint: LEFT INDEX FINGER SWELLING AND PAIN  HPI: The patient is a pleasant 69y/o male who started to have swelling and erythema in the left index finger approximately 7 days ago without any known injury. The finger has progressively worsened despite at home treatments with Benadryl, hydrocortisone cream, antiinflammatories, and rest.  The patient was seen and evaluated in the ED. Discussed the reason and rationale for surgical intervention.  He denies chest pain, shortness of breath, fever, chills, nausea, vomiting, and diarrhea.   Past Medical History:  Diagnosis Date   Healthy adult    Stroke [facility])     Past Surgical History:  Procedure Laterality Date   EP IMPLANTABLE DEVICE N/A 07/20/2016   Procedure: Loop Recorder Insertion;  Surgeon: Duke Salvia, MD;  Location: [facility] INVASIVE CV LAB;  Service: Cardiovascular;  Laterality: N/A;    Family History  Problem Relation Age of Onset   Stroke Mother 19   Diabetes Father    Cancer Brother        lung   Heart attack Neg Hx    Hypertension Neg Hx    Social History:  reports that he has never smoked. He has never used smokeless tobacco. He reports that he does not drink alcohol and does not use drugs.  Allergies: No Known Allergies  (Not in a hospital admission)   Results for orders placed or performed during the hospital encounter of 07/26/21 (from the past 48 hour(s))  CBC with Differential     Status: None (Preliminary result)   Collection Time: 07/26/21  2:00 PM  Result Value Ref Range   WBC 10.4 4.0 - 10.5 K/uL   RBC 4.49 4.22 - 5.81 MIL/uL   Hemoglobin 15.1 13.0 - 17.0 g/dL   HCT 96.0 45.4 - 09.8 %   MCV 98.9 80.0 - 100.0 fL   MCH 33.6 26.0 - 34.0 pg   MCHC 34.0 30.0 - 36.0 g/dL   RDW 11.9 14.7 - 82.9 %   Platelets PENDING 150 - 400 K/uL   nRBC 0.0 0.0 - 0.2 %    Comment: Performed at [facility], [address] [place], [place] [address]   Neutrophils Relative  % PENDING %   Neutro Abs PENDING 1.7 - 7.7 K/uL   Band Neutrophils PENDING %   Lymphocytes Relative PENDING %   Lymphs Abs PENDING 0.7 - 4.0 K/uL   Monocytes Relative PENDING %   Monocytes Absolute PENDING 0.1 - 1.0 K/uL   Eosinophils Relative PENDING %   Eosinophils Absolute PENDING 0.0 - 0.5 K/uL   Basophils Relative PENDING %   Basophils Absolute PENDING 0.0 - 0.1 K/uL   WBC Morphology PENDING    RBC Morphology PENDING    Smear Review PENDING    Other PENDING %   nRBC PENDING 0 /100 WBC   Metamyelocytes Relative PENDING %   Myelocytes PENDING %   Promyelocytes Relative PENDING %   Blasts PENDING %   Immature Granulocytes PENDING %   Abs Immature Granulocytes PENDING 0.00 - 0.07 K/uL  Lactic acid, plasma     Status: None   Collection Time: 07/26/21  2:00 PM  Result Value Ref Range   Lactic Acid, Venous 1.0 0.5 - 1.9 mmol/L    Comment: Performed at [facility], [address] [place], [place] [address]   DG Hand Complete Left  Result Date: 07/26/2021 CLINICAL DATA:  Pain, swelling and  redness of the index finger. No known injury. EXAM: LEFT HAND - COMPLETE 3+ VIEW COMPARISON:  None. FINDINGS: There is diffuse soft tissue swelling involving the index finger but I do not see any gas in the soft tissues or radiopaque foreign body. No acute bony findings or destructive bony changes. Mild periarticular osteopenia noted. There are also mild degenerative changes at the second and third MCP joints with suspected early hooked osteophytes. These findings can be seen with CPPD arthropathy. Mild degenerative changes at the [facility] joint of the thumb is noted. The radiocarpal and intercarpal joint spaces are maintained. No degenerative or erosive findings. No chondrocalcinosis. IMPRESSION: 1. Soft tissue swelling involving the index finger without gas in the soft tissues or radiopaque foreign body. 2. No acute bony findings. 3. Mild degenerative changes involving the second and third MCP  joints. Electronically Signed   By: Rudie Meyer M.D.   On: 07/26/2021 10:26    ROS NO RECENT ILLNESSES OR HOSPITALIZATIONS  Blood pressure (!) 154/84, pulse 60, temperature 97.9 F (36.6 C), resp. rate 18, height 6\' 1"  (1.854 m), weight 93 kg, SpO2 98 %. Physical Exam  General Appearance:  Alert, cooperative, no distress, appears stated age  Head:  Normocephalic, without obvious abnormality, atraumatic  Eyes:  Pupils equal, conjunctiva/corneas clear,         Throat: Lips, mucosa, and tongue normal; teeth and gums normal  Neck: No visible masses     Lungs:   respirations unlabored  Chest Wall:  No tenderness or deformity  Heart:  Regular rate and rhythm,  Abdomen:   Soft, non-tender,         Extremities: LUE - ERYTHEMA AND SWELLING OF THE INDEX FINGER RADIATING PROXIMAL TO THE MP JOINT. NO DRAINAGE. CAPILLARY REFILL LESS THAN 2 SECONDS. SENSATION INTACT TO LIGHT TOUCH DISTALLY. TENDERNESS TO PALPATION THROUGHOUT THE INDEX FINGER. ALL OTHER DIGITS ARE UNREMARKABLE.  Pulses: 2+ and symmetric  Skin: Skin color, texture, turgor normal, no rashes or lesions     Neurologic: Normal     Assessment/Plan LEFT INDEX FINGER INFECTION   - LEFT INDEX FINGER INCISION AND DRAINAGE   WE ARE PLANNING SURGERY FOR YOUR UPPER EXTREMITY. THE RISKS AND BENEFITS OF SURGERY INCLUDE BUT NOT LIMITED TO BLEEDING INFECTION, DAMAGE TO NEARBY NERVES ARTERIES TENDONS, FAILURE OF SURGERY TO ACCOMPLISH ITS INTENDED GOALS, PERSISTENT SYMPTOMS AND NEED FOR FURTHER SURGICAL INTERVENTION. WITH THIS IN MIND WE WILL PROCEED. I HAVE DISCUSSED WITH THE PATIENT THE PRE AND POSTOPERATIVE REGIMEN AND THE DOS AND DON'TS. PT VOICED UNDERSTANDING AND INFORMED CONSENT SIGNED.   R/B/A DISCUSSED WITH PT IN THE HOSPITAL.  PT VOICED UNDERSTANDING OF PLAN CONSENT SIGNED DAY OF SURGERY PT SEEN AND EXAMINED PRIOR TO OPERATIVE PROCEDURE/DAY OF SURGERY SITE MARKED. QUESTIONS ANSWERED WILL [facility] FOLLOWING SURGERY   [facility] 07/26/2021, 3:19 PM  07/28/2021 MD

## 2021-07-26 NOTE — Anesthesia Postprocedure Evaluation (Signed)
Anesthesia Post Note  Patient: [name]  Procedure(s) Performed: LEFT INDEX FINGER IRRIGATION AND DEBRIDEMENT (Left: Finger)     Patient location during evaluation: PACU Anesthesia Type: General Level of consciousness: awake Pain management: pain level controlled Vital Signs Assessment: post-procedure vital signs reviewed and stable Respiratory status: spontaneous breathing Cardiovascular status: stable Postop Assessment: no apparent nausea or vomiting Anesthetic complications: no   No notable events documented.  Last Vitals:  Vitals:   07/26/21 2015 07/26/21 2026  BP: (!) 153/94 (!) 149/87  Pulse: 74 73  Resp: 11 13  Temp:  36.8 C  SpO2: 90% 95%    Last Pain:  Vitals:   07/26/21 2026  TempSrc:   PainSc: 4                  Sephira Zellman

## 2021-07-26 NOTE — Op Note (Signed)
PREOPERATIVE DIAGNOSIS: Left index finger infection  POSTOPERATIVE DIAGNOSIS: Same  ATTENDING SURGEON: Dr. Bradly Bienenstock who scrubbed and present for the entire procedure  ASSISTANT SURGEON: None  ANESTHESIA: General via LMA  OPERATIVE PROCEDURE: Incision and drainage of left index finger flexor sheath  IMPLANTS: None  EBL: Minimal  RADIOGRAPHIC INTERPRETATION: None  SURGICAL INDICATIONS: Patient is a right-hand-dominant gentleman who presented with a worsening infection to the left index finger.  Patient was seen and evaluated at the [facility] urgent care facility transferred to [facility] for definitive treatment.  Patient was seen and evaluated at the hospital and recommended undergo the above procedure.  Risks of surgery include but not limited to bleeding infection damage nearby nerves arteries or tendons loss of motion of the wrist and digits incomplete relief of symptoms and need for further surgical invention.  Signed informed consent was obtained the day of surgery.  SURGICAL TECHNIQUE: Patient was palpated via the preoperative holding area marked apart a marker made left index finger to indicate correct operative site.  Patient brought back the operating placed supine on anesthesia table where the general anesthetic was administered.  Patient tolerated well.  A well-padded tourniquet placed on the left brachium and seal with the appropriate drape.  Left upper extremities then prepped and draped normal sterile fashion.  A timeout was called the correct site was identified procedure then begun.  Attention was then turned to the index finger small Bruner incision made directly over the proximal phalanx dissection carried down to the skin subtenons tissue down to the flexor sheath where there was fluid within the flexor sheath it was not grossly purulent.  Did have a different color than tenosynovial fluid.  The fluid was cultured.  The flexor sheath was then copiously irrigated and  drained.  Thorough wound irrigation done.  Following this the wound was loosely reapproximated and closed with simple Prolene sutures.  Adaptic dressing sterile compressive bandage applied.  The patient tolerated procedure well.  POSTOPERATIVE PLAN: Patient be discharged to home.  See him back in the office in 2 days for wound check.  Go over the cultures.  Continue with the oral antibiotics.  Continue with the finger splint.  Adjust the antibiotics per cultures if the cultures grow out anything.

## 2021-07-26 NOTE — Transfer of Care (Signed)
Immediate Anesthesia Transfer of Care Note  Patient: [name]  Procedure(s) Performed: LEFT INDEX FINGER IRRIGATION AND DEBRIDEMENT (Left)  Patient Location: PACU  Anesthesia Type:General  Level of Consciousness: awake, alert  and oriented  Airway & Oxygen Therapy: Patient Spontanous Breathing  Post-op Assessment: Report given to RN and Post -op Vital signs reviewed and stable  Post vital signs: Reviewed and stable  Last Vitals:  Vitals Value Taken Time  BP 163/90 07/26/21 1955  Temp 37.1 C 07/26/21 1955  Pulse 94 07/26/21 1956  Resp 16 07/26/21 1956  SpO2 95 % 07/26/21 1956  Vitals shown include unvalidated device data.  Last Pain:  Vitals:   07/26/21 1845  TempSrc: Oral  PainSc: 9          Complications: No notable events documented.

## 2021-07-26 NOTE — Discharge Instructions (Signed)
KEEP BANDAGE CLEAN AND DRY CALL OFFICE FOR F/U APPT [phone] IN 2 DAYS WITH SAMANTHA BARTON PA-C RX SENT TO [facility] KEEP HAND ELEVATED ABOVE HEART OK TO APPLY ICE TO OPERATIVE AREA CONTACT OFFICE IF ANY WORSENING PAIN OR CONCERNS.

## 2021-07-26 NOTE — Anesthesia Preprocedure Evaluation (Addendum)
Anesthesia Evaluation  Patient identified by MRN, date of birth, ID band Patient awake    Reviewed: Allergy & Precautions, NPO status , Patient's Chart, lab work & pertinent test results  Airway Mallampati: II  TM Distance: >3 FB     Dental   Pulmonary    breath sounds clear to auscultation       Cardiovascular  Rhythm:Regular Rate:Normal     Neuro/Psych CVA    GI/Hepatic negative GI ROS, Neg liver ROS,   Endo/Other    Renal/GU negative Renal ROS     Musculoskeletal   Abdominal   Peds  Hematology negative hematology ROS (+)   Anesthesia Other Findings   Reproductive/Obstetrics                             Anesthesia Physical Anesthesia Plan  ASA: 3  Anesthesia Plan: General   Post-op Pain Management:    Induction: Intravenous  PONV Risk Score and Plan: 2 and Ondansetron, Dexamethasone and Midazolam  Airway Management Planned: LMA  Additional Equipment:   Intra-op Plan:   Post-operative Plan: Extubation in OR  Informed Consent: I have reviewed the patients History and Physical, chart, labs and discussed the procedure including the risks, benefits and alternatives for the proposed anesthesia with the patient or authorized representative who has indicated his/her understanding and acceptance.     Dental advisory given  Plan Discussed with: CRNA and Anesthesiologist  Anesthesia Plan Comments:        Anesthesia Quick Evaluation

## 2021-07-26 NOTE — Anesthesia Procedure Notes (Signed)
Procedure Name: LMA Insertion Date/Time: 07/26/2021 7:23 PM Performed by: Dairl Ponder, CRNA Pre-anesthesia Checklist: Patient identified, Emergency Drugs available, Suction available and Patient being monitored Patient Re-evaluated:Patient Re-evaluated prior to induction Oxygen Delivery Method: Circle System Utilized Preoxygenation: Pre-oxygenation with 100% oxygen Induction Type: IV induction Ventilation: Mask ventilation without difficulty LMA: LMA inserted LMA Size: 4.0 Number of attempts: 1 Airway Equipment and Method: Bite block Placement Confirmation: positive ETCO2 Tube secured with: Tape Dental Injury: Teeth and Oropharynx as per pre-operative assessment

## 2021-07-26 NOTE — ED Notes (Signed)
Report called to Verlon Au, Consulting civil engineer at [facility] ED.

## 2021-07-26 NOTE — ED Triage Notes (Signed)
Left index finger pain started this past Thursday.  Pain started to radiate to his hand.  Denies injury.  Finger is red and swollen.

## 2021-07-27 ENCOUNTER — Encounter (HOSPITAL_COMMUNITY): Payer: Self-pay | Admitting: Orthopedic Surgery

## 2021-07-31 LAB — CULTURE, BLOOD (ROUTINE X 2)
Culture: NO GROWTH
Culture: NO GROWTH
Special Requests: ADEQUATE
Special Requests: ADEQUATE

## 2021-07-31 LAB — AEROBIC/ANAEROBIC CULTURE W GRAM STAIN (SURGICAL/DEEP WOUND): Culture: NO GROWTH

## 2021-08-29 LAB — FUNGUS CULTURE WITH STAIN

## 2021-08-29 LAB — FUNGAL ORGANISM REFLEX

## 2021-08-29 LAB — FUNGUS CULTURE RESULT

## 2021-12-14 DIAGNOSIS — E7800 Pure hypercholesterolemia, unspecified: Secondary | ICD-10-CM | POA: Diagnosis not present

## 2021-12-14 DIAGNOSIS — Z8673 Personal history of transient ischemic attack (TIA), and cerebral infarction without residual deficits: Secondary | ICD-10-CM | POA: Diagnosis not present

## 2021-12-14 DIAGNOSIS — R7309 Other abnormal glucose: Secondary | ICD-10-CM | POA: Diagnosis not present

## 2021-12-14 DIAGNOSIS — Z0000 Encounter for general adult medical examination without abnormal findings: Secondary | ICD-10-CM | POA: Diagnosis not present

## 2021-12-14 DIAGNOSIS — Z125 Encounter for screening for malignant neoplasm of prostate: Secondary | ICD-10-CM | POA: Diagnosis not present

## 2021-12-14 DIAGNOSIS — R972 Elevated prostate specific antigen [PSA]: Secondary | ICD-10-CM | POA: Diagnosis not present

## 2021-12-14 DIAGNOSIS — R946 Abnormal results of thyroid function studies: Secondary | ICD-10-CM | POA: Diagnosis not present

## 2021-12-21 DIAGNOSIS — R351 Nocturia: Secondary | ICD-10-CM | POA: Diagnosis not present

## 2021-12-21 DIAGNOSIS — Z0000 Encounter for general adult medical examination without abnormal findings: Secondary | ICD-10-CM | POA: Diagnosis not present

## 2021-12-21 DIAGNOSIS — L309 Dermatitis, unspecified: Secondary | ICD-10-CM | POA: Diagnosis not present

## 2021-12-21 DIAGNOSIS — E7800 Pure hypercholesterolemia, unspecified: Secondary | ICD-10-CM | POA: Diagnosis not present

## 2021-12-21 DIAGNOSIS — R0981 Nasal congestion: Secondary | ICD-10-CM | POA: Diagnosis not present

## 2021-12-21 DIAGNOSIS — R972 Elevated prostate specific antigen [PSA]: Secondary | ICD-10-CM | POA: Diagnosis not present

## 2021-12-21 DIAGNOSIS — Z8673 Personal history of transient ischemic attack (TIA), and cerebral infarction without residual deficits: Secondary | ICD-10-CM | POA: Diagnosis not present

## 2021-12-21 DIAGNOSIS — Z23 Encounter for immunization: Secondary | ICD-10-CM | POA: Diagnosis not present

## 2022-03-22 DIAGNOSIS — R972 Elevated prostate specific antigen [PSA]: Secondary | ICD-10-CM | POA: Diagnosis not present

## 2022-03-22 DIAGNOSIS — Z125 Encounter for screening for malignant neoplasm of prostate: Secondary | ICD-10-CM | POA: Diagnosis not present

## 2022-03-30 DIAGNOSIS — R7309 Other abnormal glucose: Secondary | ICD-10-CM | POA: Diagnosis not present

## 2022-03-30 DIAGNOSIS — Z8673 Personal history of transient ischemic attack (TIA), and cerebral infarction without residual deficits: Secondary | ICD-10-CM | POA: Diagnosis not present

## 2022-03-30 DIAGNOSIS — R351 Nocturia: Secondary | ICD-10-CM | POA: Diagnosis not present

## 2022-03-30 DIAGNOSIS — L309 Dermatitis, unspecified: Secondary | ICD-10-CM | POA: Diagnosis not present

## 2022-03-30 DIAGNOSIS — R946 Abnormal results of thyroid function studies: Secondary | ICD-10-CM | POA: Diagnosis not present

## 2022-03-30 DIAGNOSIS — E7800 Pure hypercholesterolemia, unspecified: Secondary | ICD-10-CM | POA: Diagnosis not present

## 2022-03-30 DIAGNOSIS — R972 Elevated prostate specific antigen [PSA]: Secondary | ICD-10-CM | POA: Diagnosis not present

## 2022-03-30 DIAGNOSIS — E782 Mixed hyperlipidemia: Secondary | ICD-10-CM | POA: Diagnosis not present

## 2022-11-10 DIAGNOSIS — H6122 Impacted cerumen, left ear: Secondary | ICD-10-CM | POA: Diagnosis not present

## 2022-11-10 DIAGNOSIS — J069 Acute upper respiratory infection, unspecified: Secondary | ICD-10-CM | POA: Diagnosis not present

## 2022-11-10 DIAGNOSIS — H6991 Unspecified Eustachian tube disorder, right ear: Secondary | ICD-10-CM | POA: Diagnosis not present

## 2022-12-20 DIAGNOSIS — E782 Mixed hyperlipidemia: Secondary | ICD-10-CM | POA: Diagnosis not present

## 2022-12-20 DIAGNOSIS — R972 Elevated prostate specific antigen [PSA]: Secondary | ICD-10-CM | POA: Diagnosis not present

## 2022-12-25 DIAGNOSIS — L309 Dermatitis, unspecified: Secondary | ICD-10-CM | POA: Diagnosis not present

## 2022-12-25 DIAGNOSIS — R972 Elevated prostate specific antigen [PSA]: Secondary | ICD-10-CM | POA: Diagnosis not present

## 2022-12-25 DIAGNOSIS — Z8673 Personal history of transient ischemic attack (TIA), and cerebral infarction without residual deficits: Secondary | ICD-10-CM | POA: Diagnosis not present

## 2022-12-25 DIAGNOSIS — Z0000 Encounter for general adult medical examination without abnormal findings: Secondary | ICD-10-CM | POA: Diagnosis not present

## 2022-12-25 DIAGNOSIS — Z7952 Long term (current) use of systemic steroids: Secondary | ICD-10-CM | POA: Diagnosis not present

## 2022-12-25 DIAGNOSIS — Z79899 Other long term (current) drug therapy: Secondary | ICD-10-CM | POA: Diagnosis not present

## 2022-12-25 DIAGNOSIS — H6992 Unspecified Eustachian tube disorder, left ear: Secondary | ICD-10-CM | POA: Diagnosis not present

## 2022-12-25 DIAGNOSIS — R7309 Other abnormal glucose: Secondary | ICD-10-CM | POA: Diagnosis not present

## 2022-12-25 DIAGNOSIS — E782 Mixed hyperlipidemia: Secondary | ICD-10-CM | POA: Diagnosis not present

## 2023-01-02 DIAGNOSIS — H2513 Age-related nuclear cataract, bilateral: Secondary | ICD-10-CM | POA: Diagnosis not present

## 2023-01-02 DIAGNOSIS — H52221 Regular astigmatism, right eye: Secondary | ICD-10-CM | POA: Diagnosis not present

## 2023-01-02 DIAGNOSIS — H5203 Hypermetropia, bilateral: Secondary | ICD-10-CM | POA: Diagnosis not present

## 2023-02-01 DIAGNOSIS — R351 Nocturia: Secondary | ICD-10-CM | POA: Diagnosis not present

## 2023-02-01 DIAGNOSIS — N401 Enlarged prostate with lower urinary tract symptoms: Secondary | ICD-10-CM | POA: Diagnosis not present

## 2023-02-01 DIAGNOSIS — R972 Elevated prostate specific antigen [PSA]: Secondary | ICD-10-CM | POA: Diagnosis not present

## 2023-02-20 DIAGNOSIS — M79672 Pain in left foot: Secondary | ICD-10-CM | POA: Diagnosis not present

## 2023-02-20 DIAGNOSIS — M7732 Calcaneal spur, left foot: Secondary | ICD-10-CM | POA: Diagnosis not present

## 2023-04-02 DIAGNOSIS — H2513 Age-related nuclear cataract, bilateral: Secondary | ICD-10-CM | POA: Diagnosis not present

## 2023-04-02 DIAGNOSIS — H18413 Arcus senilis, bilateral: Secondary | ICD-10-CM | POA: Diagnosis not present

## 2023-04-02 DIAGNOSIS — H25013 Cortical age-related cataract, bilateral: Secondary | ICD-10-CM | POA: Diagnosis not present

## 2023-04-02 DIAGNOSIS — H25043 Posterior subcapsular polar age-related cataract, bilateral: Secondary | ICD-10-CM | POA: Diagnosis not present

## 2023-04-02 DIAGNOSIS — H2511 Age-related nuclear cataract, right eye: Secondary | ICD-10-CM | POA: Diagnosis not present

## 2023-06-26 DIAGNOSIS — H2511 Age-related nuclear cataract, right eye: Secondary | ICD-10-CM | POA: Diagnosis not present

## 2023-06-27 DIAGNOSIS — H2512 Age-related nuclear cataract, left eye: Secondary | ICD-10-CM | POA: Diagnosis not present

## 2023-07-03 DIAGNOSIS — H2512 Age-related nuclear cataract, left eye: Secondary | ICD-10-CM | POA: Diagnosis not present

## 2023-07-03 DIAGNOSIS — H2511 Age-related nuclear cataract, right eye: Secondary | ICD-10-CM | POA: Diagnosis not present

## 2023-07-24 DIAGNOSIS — H2512 Age-related nuclear cataract, left eye: Secondary | ICD-10-CM | POA: Diagnosis not present

## 2023-07-25 DIAGNOSIS — R972 Elevated prostate specific antigen [PSA]: Secondary | ICD-10-CM | POA: Diagnosis not present

## 2023-07-31 DIAGNOSIS — H2511 Age-related nuclear cataract, right eye: Secondary | ICD-10-CM | POA: Diagnosis not present

## 2023-07-31 DIAGNOSIS — H2512 Age-related nuclear cataract, left eye: Secondary | ICD-10-CM | POA: Diagnosis not present

## 2023-08-01 DIAGNOSIS — N400 Enlarged prostate without lower urinary tract symptoms: Secondary | ICD-10-CM | POA: Diagnosis not present

## 2023-08-01 DIAGNOSIS — R972 Elevated prostate specific antigen [PSA]: Secondary | ICD-10-CM | POA: Diagnosis not present

## 2023-08-13 DIAGNOSIS — E7800 Pure hypercholesterolemia, unspecified: Secondary | ICD-10-CM | POA: Diagnosis not present

## 2023-08-13 DIAGNOSIS — Z7952 Long term (current) use of systemic steroids: Secondary | ICD-10-CM | POA: Diagnosis not present

## 2023-08-13 DIAGNOSIS — R7309 Other abnormal glucose: Secondary | ICD-10-CM | POA: Diagnosis not present

## 2023-08-13 DIAGNOSIS — E782 Mixed hyperlipidemia: Secondary | ICD-10-CM | POA: Diagnosis not present

## 2023-08-13 DIAGNOSIS — Z79899 Other long term (current) drug therapy: Secondary | ICD-10-CM | POA: Diagnosis not present

## 2023-08-20 DIAGNOSIS — E7800 Pure hypercholesterolemia, unspecified: Secondary | ICD-10-CM | POA: Diagnosis not present

## 2023-08-20 DIAGNOSIS — Z79899 Other long term (current) drug therapy: Secondary | ICD-10-CM | POA: Diagnosis not present

## 2023-08-20 DIAGNOSIS — Z8673 Personal history of transient ischemic attack (TIA), and cerebral infarction without residual deficits: Secondary | ICD-10-CM | POA: Diagnosis not present

## 2023-08-20 DIAGNOSIS — Z7952 Long term (current) use of systemic steroids: Secondary | ICD-10-CM | POA: Diagnosis not present

## 2023-08-20 DIAGNOSIS — E782 Mixed hyperlipidemia: Secondary | ICD-10-CM | POA: Diagnosis not present

## 2023-08-20 DIAGNOSIS — H6123 Impacted cerumen, bilateral: Secondary | ICD-10-CM | POA: Diagnosis not present

## 2023-08-20 DIAGNOSIS — L309 Dermatitis, unspecified: Secondary | ICD-10-CM | POA: Diagnosis not present

## 2023-08-20 DIAGNOSIS — R972 Elevated prostate specific antigen [PSA]: Secondary | ICD-10-CM | POA: Diagnosis not present

## 2023-08-20 DIAGNOSIS — N400 Enlarged prostate without lower urinary tract symptoms: Secondary | ICD-10-CM | POA: Diagnosis not present

## 2023-12-23 DIAGNOSIS — Z79899 Other long term (current) drug therapy: Secondary | ICD-10-CM | POA: Diagnosis not present

## 2023-12-23 DIAGNOSIS — Z8673 Personal history of transient ischemic attack (TIA), and cerebral infarction without residual deficits: Secondary | ICD-10-CM | POA: Diagnosis not present

## 2023-12-23 DIAGNOSIS — R634 Abnormal weight loss: Secondary | ICD-10-CM | POA: Diagnosis not present

## 2023-12-23 DIAGNOSIS — Z7952 Long term (current) use of systemic steroids: Secondary | ICD-10-CM | POA: Diagnosis not present

## 2023-12-23 DIAGNOSIS — N400 Enlarged prostate without lower urinary tract symptoms: Secondary | ICD-10-CM | POA: Diagnosis not present

## 2023-12-23 DIAGNOSIS — R972 Elevated prostate specific antigen [PSA]: Secondary | ICD-10-CM | POA: Diagnosis not present

## 2023-12-23 DIAGNOSIS — E7800 Pure hypercholesterolemia, unspecified: Secondary | ICD-10-CM | POA: Diagnosis not present

## 2023-12-31 DIAGNOSIS — L309 Dermatitis, unspecified: Secondary | ICD-10-CM | POA: Diagnosis not present

## 2023-12-31 DIAGNOSIS — Z7952 Long term (current) use of systemic steroids: Secondary | ICD-10-CM | POA: Diagnosis not present

## 2023-12-31 DIAGNOSIS — Z0000 Encounter for general adult medical examination without abnormal findings: Secondary | ICD-10-CM | POA: Diagnosis not present

## 2023-12-31 DIAGNOSIS — E782 Mixed hyperlipidemia: Secondary | ICD-10-CM | POA: Diagnosis not present

## 2023-12-31 DIAGNOSIS — N400 Enlarged prostate without lower urinary tract symptoms: Secondary | ICD-10-CM | POA: Diagnosis not present

## 2023-12-31 DIAGNOSIS — Z23 Encounter for immunization: Secondary | ICD-10-CM | POA: Diagnosis not present

## 2023-12-31 DIAGNOSIS — R972 Elevated prostate specific antigen [PSA]: Secondary | ICD-10-CM | POA: Diagnosis not present

## 2023-12-31 DIAGNOSIS — Z8673 Personal history of transient ischemic attack (TIA), and cerebral infarction without residual deficits: Secondary | ICD-10-CM | POA: Diagnosis not present

## 2024-01-27 DIAGNOSIS — Z1212 Encounter for screening for malignant neoplasm of rectum: Secondary | ICD-10-CM | POA: Diagnosis not present

## 2024-01-27 DIAGNOSIS — Z1211 Encounter for screening for malignant neoplasm of colon: Secondary | ICD-10-CM | POA: Diagnosis not present

## 2024-02-01 LAB — COLOGUARD: COLOGUARD: NEGATIVE

## 2024-06-18 DIAGNOSIS — Z7952 Long term (current) use of systemic steroids: Secondary | ICD-10-CM | POA: Diagnosis not present

## 2024-06-18 DIAGNOSIS — E782 Mixed hyperlipidemia: Secondary | ICD-10-CM | POA: Diagnosis not present

## 2024-06-18 DIAGNOSIS — N400 Enlarged prostate without lower urinary tract symptoms: Secondary | ICD-10-CM | POA: Diagnosis not present

## 2024-06-18 DIAGNOSIS — R972 Elevated prostate specific antigen [PSA]: Secondary | ICD-10-CM | POA: Diagnosis not present

## 2024-06-25 DIAGNOSIS — L309 Dermatitis, unspecified: Secondary | ICD-10-CM | POA: Diagnosis not present

## 2024-06-25 DIAGNOSIS — Z7952 Long term (current) use of systemic steroids: Secondary | ICD-10-CM | POA: Diagnosis not present

## 2024-06-25 DIAGNOSIS — R7309 Other abnormal glucose: Secondary | ICD-10-CM | POA: Diagnosis not present

## 2024-06-25 DIAGNOSIS — E782 Mixed hyperlipidemia: Secondary | ICD-10-CM | POA: Diagnosis not present

## 2024-06-25 DIAGNOSIS — Z79899 Other long term (current) drug therapy: Secondary | ICD-10-CM | POA: Diagnosis not present

## 2024-06-25 DIAGNOSIS — N400 Enlarged prostate without lower urinary tract symptoms: Secondary | ICD-10-CM | POA: Diagnosis not present

## 2024-06-25 DIAGNOSIS — Z8673 Personal history of transient ischemic attack (TIA), and cerebral infarction without residual deficits: Secondary | ICD-10-CM | POA: Diagnosis not present

## 2024-06-25 DIAGNOSIS — H6120 Impacted cerumen, unspecified ear: Secondary | ICD-10-CM | POA: Diagnosis not present

## 2024-06-25 DIAGNOSIS — R972 Elevated prostate specific antigen [PSA]: Secondary | ICD-10-CM | POA: Diagnosis not present

## 2024-07-02 DIAGNOSIS — Z8673 Personal history of transient ischemic attack (TIA), and cerebral infarction without residual deficits: Secondary | ICD-10-CM | POA: Diagnosis not present

## 2024-07-02 DIAGNOSIS — G44209 Tension-type headache, unspecified, not intractable: Secondary | ICD-10-CM | POA: Diagnosis not present

## 2024-07-02 DIAGNOSIS — R42 Dizziness and giddiness: Secondary | ICD-10-CM | POA: Diagnosis not present

## 2024-07-21 DIAGNOSIS — R972 Elevated prostate specific antigen [PSA]: Secondary | ICD-10-CM | POA: Diagnosis not present

## 2024-07-23 ENCOUNTER — Telehealth: Payer: Self-pay | Admitting: Internal Medicine

## 2024-07-23 NOTE — Telephone Encounter (Signed)
 Wife Isidoro) stated patient has a loop recorder and is now being scheduled for an MRI.  Wife wants advice on next steps.

## 2024-07-23 NOTE — Telephone Encounter (Signed)
 Spoke with the patient's wife and advised that the patient is okay to have an MRI with a loop recorder.

## 2024-08-08 DIAGNOSIS — R972 Elevated prostate specific antigen [PSA]: Secondary | ICD-10-CM | POA: Diagnosis not present

## 2024-09-15 DIAGNOSIS — R972 Elevated prostate specific antigen [PSA]: Secondary | ICD-10-CM | POA: Diagnosis not present

## 2024-09-15 DIAGNOSIS — R935 Abnormal findings on diagnostic imaging of other abdominal regions, including retroperitoneum: Secondary | ICD-10-CM | POA: Diagnosis not present

## 2024-10-08 DIAGNOSIS — Z23 Encounter for immunization: Secondary | ICD-10-CM | POA: Diagnosis not present

## 2024-10-08 DIAGNOSIS — R972 Elevated prostate specific antigen [PSA]: Secondary | ICD-10-CM | POA: Diagnosis not present

## 2024-10-26 DIAGNOSIS — R399 Unspecified symptoms and signs involving the genitourinary system: Secondary | ICD-10-CM | POA: Diagnosis not present

## 2024-10-26 DIAGNOSIS — R972 Elevated prostate specific antigen [PSA]: Secondary | ICD-10-CM | POA: Diagnosis not present

## 2024-10-29 ENCOUNTER — Emergency Department (HOSPITAL_COMMUNITY)
Admission: EM | Admit: 2024-10-29 | Discharge: 2024-10-29 | Disposition: A | Discharge status: Home / Self Care | Attending: Emergency Medicine | Admitting: Emergency Medicine

## 2024-10-29 ENCOUNTER — Emergency Department (HOSPITAL_COMMUNITY)

## 2024-10-29 DIAGNOSIS — R0789 Other chest pain: Secondary | ICD-10-CM | POA: Diagnosis not present

## 2024-10-29 DIAGNOSIS — Z7982 Long term (current) use of aspirin: Secondary | ICD-10-CM | POA: Diagnosis not present

## 2024-10-29 DIAGNOSIS — Z8673 Personal history of transient ischemic attack (TIA), and cerebral infarction without residual deficits: Secondary | ICD-10-CM | POA: Insufficient documentation

## 2024-10-29 DIAGNOSIS — R918 Other nonspecific abnormal finding of lung field: Secondary | ICD-10-CM | POA: Diagnosis not present

## 2024-10-29 DIAGNOSIS — R519 Headache, unspecified: Secondary | ICD-10-CM | POA: Diagnosis not present

## 2024-10-29 DIAGNOSIS — R42 Dizziness and giddiness: Secondary | ICD-10-CM | POA: Insufficient documentation

## 2024-10-29 DIAGNOSIS — R0989 Other specified symptoms and signs involving the circulatory and respiratory systems: Secondary | ICD-10-CM | POA: Diagnosis not present

## 2024-10-29 DIAGNOSIS — R079 Chest pain, unspecified: Secondary | ICD-10-CM | POA: Diagnosis not present

## 2024-10-29 DIAGNOSIS — I517 Cardiomegaly: Secondary | ICD-10-CM | POA: Diagnosis not present

## 2024-10-29 LAB — CBC
HCT: 42.9 % (ref 39.0–52.0)
Hemoglobin: 13.9 g/dL (ref 13.0–17.0)
MCH: 32.1 pg (ref 26.0–34.0)
MCHC: 32.4 g/dL (ref 30.0–36.0)
MCV: 99.1 fL (ref 80.0–100.0)
Platelets: 188 K/uL (ref 150–400)
RBC: 4.33 MIL/uL (ref 4.22–5.81)
RDW: 13.5 % (ref 11.5–15.5)
WBC: 7.2 K/uL (ref 4.0–10.5)
nRBC: 0 % (ref 0.0–0.2)

## 2024-10-29 LAB — BASIC METABOLIC PANEL WITH GFR
Anion gap: 10 (ref 5–15)
BUN: 14 mg/dL (ref 8–23)
CO2: 25 mmol/L (ref 22–32)
Calcium: 9.2 mg/dL (ref 8.9–10.3)
Chloride: 109 mmol/L (ref 98–111)
Creatinine, Ser: 0.95 mg/dL (ref 0.61–1.24)
GFR, Estimated: >60 mL/min (ref >60)
Glucose, Bld: 103 mg/dL — ABNORMAL HIGH (ref 70–99)
Potassium: 3.9 mmol/L (ref 3.5–5.1)
Sodium: 144 mmol/L (ref 135–145)

## 2024-10-29 LAB — D-DIMER, QUANTITATIVE: D-Dimer, Quant: 0.32 ug{FEU}/mL (ref 0.00–0.50)

## 2024-10-29 LAB — TROPONIN T, HIGH SENSITIVITY
Troponin T High Sensitivity: 15 ng/L (ref 0–19)
Troponin T High Sensitivity: 17 ng/L (ref 0–19)

## 2024-10-29 MED ORDER — ASPIRIN 81 MG PO CHEW
81.0000 mg | CHEWABLE_TABLET | Freq: Once | ORAL | Status: AC
  Administered 2024-10-29: 81 mg via ORAL
  Filled 2024-10-29: qty 1

## 2024-10-29 MED ORDER — FAMOTIDINE IN NACL 20-0.9 MG/50ML-% IV SOLN
20.0000 mg | Freq: Once | INTRAVENOUS | Status: AC
  Administered 2024-10-29: 20 mg via INTRAVENOUS
  Filled 2024-10-29: qty 50

## 2024-10-29 MED ORDER — SODIUM CHLORIDE 0.9 % IV BOLUS
500.0000 mL | Freq: Once | INTRAVENOUS | Status: AC
  Administered 2024-10-29: 500 mL via INTRAVENOUS

## 2024-10-29 NOTE — ED Triage Notes (Addendum)
 Chest pain starting within the hour while lying on the couch. 6/10 chest pressure mid sternum area. Does not radiate. Some nausea, dizziness. Denies cardiac hx. Prior stroke in 2017

## 2024-10-29 NOTE — Discharge Instructions (Signed)
 Follow-up with your family doctor next week and take [place] or Pepcid for possible reflux

## 2024-10-29 NOTE — ED Provider Notes (Signed)
 [place] EMERGENCY DEPARTMENT AT [facility] Provider Note   CSN: [id number] Arrival date & time: 10/29/24  1403     Patient presents with: Chest Pain   [name] is a 73 y.o. male.  {Add pertinent medical, surgical, social history, OB history to YEP:67052} HPI Patient presents with his wife who assists with history.  About 1 hour prior to ED arrival the patient began feeling chest tightness, facial flushing.  No dyspnea, no asymmetric weakness, no confusion, no fall.  Patient has a history of prior stroke, takes aspirin  daily  He was in his usual state of health prior to the development of symptoms.  Prior to Admission medications   Medication Sig Start Date End Date Taking? Authorizing Provider  aspirin  EC 81 MG tablet Take 81 mg by mouth at bedtime.     [provider]  atorvastatin  (LIPITOR) 40 MG tablet Take 1 tablet (40 mg total) by mouth daily at 6 PM. 05/18/16   Love, Sharlet RAMAN, PA-C  doxycycline  (VIBRAMYCIN ) 50 MG capsule Take 1 capsule (50 mg total) by mouth 2 (two) times daily. 07/26/21   Marland Lucie Boot, PA  meclizine  (ANTIVERT ) 12.5 MG tablet Take 1 tablet (12.5 mg total) by mouth 3 (three) times daily as needed for dizziness. 05/18/16   Maurice Sharlet RAMAN, PA-C    Allergies: Patient has no known allergies.    Review of Systems  Updated Vital Signs BP (!) 142/98 (BP Location: Right Arm)   Pulse 72   Temp 97.6 F (36.4 C)   Resp 18   Ht 1.854 m (6' 1)   Wt 95.3 kg   SpO2 98%   BMI 27.71 kg/m   Physical Exam Vitals and nursing note reviewed.  Constitutional:      General: He is not in acute distress.    Appearance: He is well-developed.  HENT:     Head: Normocephalic and atraumatic.  Eyes:     Conjunctiva/sclera: Conjunctivae normal.  Cardiovascular:     Rate and Rhythm: Normal rate and regular rhythm.  Pulmonary:     Effort: Pulmonary effort is normal. No respiratory distress.     Breath sounds: No stridor.  Abdominal:      General: There is no distension.  Skin:    General: Skin is warm and dry.      Neurological:     General: No focal deficit present.     Mental Status: He is alert and oriented to person, place, and time.     Cranial Nerves: No cranial nerve deficit.     Motor: No weakness.     (all labs ordered are listed, but only abnormal results are displayed) Labs Reviewed  CBC  BASIC METABOLIC PANEL WITH GFR  TROPONIN T, HIGH SENSITIVITY  TROPONIN T, HIGH SENSITIVITY    EKG: EKG Interpretation Date/Time:  Thursday October 29 2024 14:20:46 EDT Ventricular Rate:  67 PR Interval:    QRS Duration:  106 QT Interval:  409 QTC Calculation: 432 R Axis:   53  Text Interpretation: Sinus rhythm Artifact Abnormal ECG Confirmed by Garrick Charleston [phone]) on 10/29/2024 2:32:29 PM  Radiology: DG Chest 2 View Result Date: 10/29/2024 EXAM: 2 VIEW(S) XRAY OF THE CHEST 10/29/2024 03:02:00 PM COMPARISON: None available. CLINICAL HISTORY: cp FINDINGS: LINES, TUBES AND DEVICES: Recording device over left chest. LUNGS AND PLEURA: Hypoventilatory changes. Patchy atelectasis or mild pneumonia at the left greater than right lung base. No pulmonary edema. No pleural effusion. No pneumothorax. HEART AND  MEDIASTINUM: Borderline cardiomegaly augmented by low lung volume. BONES AND SOFT TISSUES: No acute osseous abnormality. IMPRESSION: 1. Patchy atelectasis versus mild pneumonia at the lung bases, left greater than right. 2. Borderline cardiomegaly, which may be accentuated by low lung volumes. Electronically signed by: Luke Bun MD 10/29/2024 03:16 PM EDT RP Workstation: [id number]    {Document cardiac monitor, telemetry assessment procedure when appropriate:32947} Procedures   Medications Ordered in the ED - No data to display    {Click here for ABCD2, HEART and other calculators REFRESH Note before signing:1}                              Medical Decision Making Patient with history of prior stroke,  now presents with chest pain, facial flushing.  Patient's neuroexam is reassuring, for low suspicion of stroke, but given hyperlipidemia, prior stroke, elevated risk profile, concern for ACS, other intrathoracic abnormalities considered. Cardiac 75 sinus normal pulse ox 98% room air normal initial ECG nonischemic.  Amount and/or Complexity of Data Reviewed Independent Historian: spouse External Data Reviewed: notes. Labs: ordered. Decision-making details documented in ED Course. Radiology: ordered and independent interpretation performed. Decision-making details documented in ED Course. ECG/medicine tests: ordered and independent interpretation performed. Decision-making details documented in ED Course.  Risk Prescription drug management. Decision regarding hospitalization. Diagnosis or treatment significantly limited by social determinants of health.   ***  {Document critical care time when appropriate  Document review of labs and clinical decision tools ie CHADS2VASC2, etc  Document your independent review of radiology images and any outside records  Document your discussion with family members, caretakers and with consultants  Document social determinants of health affecting pt's care  Document your decision making why or why not admission, treatments were needed:32947:::1}   Final diagnoses:  None    ED Discharge Orders     None

## 2025-01-18 ENCOUNTER — Other Ambulatory Visit (HOSPITAL_COMMUNITY): Payer: Self-pay | Admitting: Urology

## 2025-01-18 DIAGNOSIS — C61 Malignant neoplasm of prostate: Secondary | ICD-10-CM

## 2025-02-04 ENCOUNTER — Encounter (HOSPITAL_COMMUNITY): Admission: RE | Admit: 2025-02-04 | Source: Ambulatory Visit

## 2025-02-04 DIAGNOSIS — C61 Malignant neoplasm of prostate: Secondary | ICD-10-CM

## 2025-02-04 MED ORDER — FLOTUFOLASTAT F 18 GALLIUM 296-5846 MBQ/ML IV SOLN
8.1700 | Freq: Once | INTRAVENOUS | Status: AC
  Administered 2025-02-04: 8.17 via INTRAVENOUS

## 2025-02-04 NOTE — Progress Notes (Incomplete)
 GU Location of Tumor / Histology: Prostate Ca  If Prostate Cancer, Gleason Score is (3 + 4) and PSA is (6.97 on 07/21/2024)  PSA 6.17 on 07/25/2023 PSA 8.0 on 12/20/2022 PSA 6.13 on 03/23/2022 PSA 6.57 on 12/15/2021  [name] presented as referral from Dr. Ricardo B. Alvaro Raddle. [facility]) elevated PSA.  Biopsies      Dr. Ricardo B. Alvaro Raddle. NM PET (PSMA) Skull to Mid Thigh CLINICAL DATA:     Past/Anticipated interventions by urology, if any:  Dr. Ricardo B. Alvaro Raddle.   Past/Anticipated interventions by medical oncology, if any: N/A  Weight changes, if any: {:18581}  IPSS: SHIM:  Bowel/Bladder complaints, if any: {:18581}   Nausea/Vomiting, if any: {:18581}  Pain issues, if any:  {:18581}  SAFETY ISSUES: Prior radiation? {:18581} Pacemaker/ICD? {:18581} Possible current pregnancy? Male Is the patient on methotrexate? No  Current Complaints / other details:    30 minutes spent total, including time for meaningful use questions, reviewing medication, as well as spent in face-to-face time in nurse evaluation with the patient.

## 2025-02-09 ENCOUNTER — Ambulatory Visit

## 2025-02-09 ENCOUNTER — Ambulatory Visit: Admitting: Radiation Oncology
